# Patient Record
Sex: Male | Born: 1955 | Race: White | Hispanic: No | Marital: Single | Smoking: Current every day smoker
Health system: Southern US, Community
[De-identification: ages and names within clinical notes are randomized; demographics above are authoritative.]

## PROBLEM LIST (undated history)

## (undated) DIAGNOSIS — Z8719 Personal history of other diseases of the digestive system: Secondary | ICD-10-CM

## (undated) DIAGNOSIS — E039 Hypothyroidism, unspecified: Secondary | ICD-10-CM

## (undated) DIAGNOSIS — R519 Headache, unspecified: Secondary | ICD-10-CM

## (undated) DIAGNOSIS — M75100 Unspecified rotator cuff tear or rupture of unspecified shoulder, not specified as traumatic: Secondary | ICD-10-CM

## (undated) DIAGNOSIS — B1920 Unspecified viral hepatitis C without hepatic coma: Secondary | ICD-10-CM

## (undated) DIAGNOSIS — J40 Bronchitis, not specified as acute or chronic: Secondary | ICD-10-CM

## (undated) DIAGNOSIS — K5909 Other constipation: Secondary | ICD-10-CM

## (undated) DIAGNOSIS — N400 Enlarged prostate without lower urinary tract symptoms: Secondary | ICD-10-CM

## (undated) DIAGNOSIS — J45909 Unspecified asthma, uncomplicated: Secondary | ICD-10-CM

## (undated) DIAGNOSIS — J189 Pneumonia, unspecified organism: Secondary | ICD-10-CM

## (undated) DIAGNOSIS — J449 Chronic obstructive pulmonary disease, unspecified: Secondary | ICD-10-CM

## (undated) DIAGNOSIS — H269 Unspecified cataract: Secondary | ICD-10-CM

## (undated) DIAGNOSIS — R0602 Shortness of breath: Secondary | ICD-10-CM

## (undated) DIAGNOSIS — Z973 Presence of spectacles and contact lenses: Secondary | ICD-10-CM

## (undated) DIAGNOSIS — K219 Gastro-esophageal reflux disease without esophagitis: Secondary | ICD-10-CM

## (undated) DIAGNOSIS — B009 Herpesviral infection, unspecified: Secondary | ICD-10-CM

## (undated) DIAGNOSIS — Z8669 Personal history of other diseases of the nervous system and sense organs: Secondary | ICD-10-CM

## (undated) DIAGNOSIS — M1990 Unspecified osteoarthritis, unspecified site: Secondary | ICD-10-CM

## (undated) DIAGNOSIS — F329 Major depressive disorder, single episode, unspecified: Secondary | ICD-10-CM

## (undated) DIAGNOSIS — S199XXA Unspecified injury of neck, initial encounter: Secondary | ICD-10-CM

## (undated) DIAGNOSIS — G253 Myoclonus: Secondary | ICD-10-CM

## (undated) DIAGNOSIS — F319 Bipolar disorder, unspecified: Secondary | ICD-10-CM

## (undated) DIAGNOSIS — M6530 Trigger finger, unspecified finger: Secondary | ICD-10-CM

## (undated) DIAGNOSIS — F419 Anxiety disorder, unspecified: Secondary | ICD-10-CM

## (undated) DIAGNOSIS — R413 Other amnesia: Secondary | ICD-10-CM

## (undated) DIAGNOSIS — M722 Plantar fascial fibromatosis: Secondary | ICD-10-CM

## (undated) DIAGNOSIS — F209 Schizophrenia, unspecified: Secondary | ICD-10-CM

## (undated) DIAGNOSIS — F32A Depression, unspecified: Secondary | ICD-10-CM

## (undated) DIAGNOSIS — R51 Headache: Secondary | ICD-10-CM

## (undated) DIAGNOSIS — R351 Nocturia: Secondary | ICD-10-CM

## (undated) DIAGNOSIS — H04129 Dry eye syndrome of unspecified lacrimal gland: Secondary | ICD-10-CM

## (undated) DIAGNOSIS — G8929 Other chronic pain: Secondary | ICD-10-CM

## (undated) DIAGNOSIS — R350 Frequency of micturition: Secondary | ICD-10-CM

## (undated) HISTORY — DX: Bronchitis, not specified as acute or chronic: J40

## (undated) HISTORY — DX: Unspecified osteoarthritis, unspecified site: M19.90

## (undated) HISTORY — DX: Trigger finger, unspecified finger: M65.30

## (undated) HISTORY — DX: Myoclonus: G25.3

## (undated) HISTORY — DX: Other chronic pain: G89.29

## (undated) HISTORY — DX: Unspecified injury of neck, initial encounter: S19.9XXA

## (undated) HISTORY — DX: Other amnesia: R41.3

## (undated) HISTORY — DX: Herpesviral infection, unspecified: B00.9

## (undated) HISTORY — DX: Anxiety disorder, unspecified: F41.9

## (undated) HISTORY — DX: Personal history of other diseases of the nervous system and sense organs: Z86.69

## (undated) HISTORY — DX: Nocturia: R35.1

## (undated) HISTORY — DX: Shortness of breath: R06.02

## (undated) HISTORY — DX: Benign prostatic hyperplasia without lower urinary tract symptoms: N40.0

## (undated) HISTORY — DX: Hypothyroidism, unspecified: E03.9

## (undated) HISTORY — PX: TRIGGER FINGER RELEASE: SHX641

## (undated) HISTORY — DX: Plantar fascial fibromatosis: M72.2

## (undated) HISTORY — DX: Personal history of other diseases of the digestive system: Z87.19

## (undated) HISTORY — DX: Unspecified viral hepatitis C without hepatic coma: B19.20

## (undated) HISTORY — DX: Depression, unspecified: F32.A

## (undated) HISTORY — DX: Other constipation: K59.09

## (undated) HISTORY — DX: Frequency of micturition: R35.0

## (undated) HISTORY — PX: ROTATOR CUFF REPAIR: SHX139

## (undated) HISTORY — DX: Major depressive disorder, single episode, unspecified: F32.9

---

## 2004-11-23 ENCOUNTER — Emergency Department: Payer: Self-pay | Admitting: Emergency Medicine

## 2005-12-03 ENCOUNTER — Emergency Department: Payer: Self-pay | Admitting: Emergency Medicine

## 2006-04-07 ENCOUNTER — Other Ambulatory Visit: Payer: Self-pay

## 2006-04-07 ENCOUNTER — Emergency Department: Payer: Self-pay | Admitting: Emergency Medicine

## 2008-01-10 ENCOUNTER — Emergency Department: Payer: Self-pay | Admitting: Emergency Medicine

## 2008-03-24 ENCOUNTER — Ambulatory Visit: Payer: Self-pay

## 2008-07-03 ENCOUNTER — Other Ambulatory Visit: Payer: Self-pay

## 2008-07-03 ENCOUNTER — Emergency Department: Payer: Self-pay | Admitting: Emergency Medicine

## 2008-07-18 ENCOUNTER — Ambulatory Visit: Payer: Self-pay | Admitting: Internal Medicine

## 2008-10-20 ENCOUNTER — Emergency Department: Payer: Self-pay | Admitting: Emergency Medicine

## 2008-11-15 ENCOUNTER — Emergency Department: Payer: Self-pay | Admitting: Emergency Medicine

## 2008-12-04 ENCOUNTER — Emergency Department: Payer: Self-pay | Admitting: Emergency Medicine

## 2008-12-13 ENCOUNTER — Emergency Department: Payer: Self-pay | Admitting: Emergency Medicine

## 2008-12-15 ENCOUNTER — Ambulatory Visit: Payer: Self-pay | Admitting: Pain Medicine

## 2008-12-18 ENCOUNTER — Emergency Department: Payer: Self-pay | Admitting: Unknown Physician Specialty

## 2008-12-27 ENCOUNTER — Emergency Department: Payer: Self-pay | Admitting: Unknown Physician Specialty

## 2009-01-12 ENCOUNTER — Emergency Department: Payer: Self-pay | Admitting: Emergency Medicine

## 2009-01-14 ENCOUNTER — Emergency Department: Payer: Self-pay

## 2009-03-12 ENCOUNTER — Emergency Department: Payer: Self-pay | Admitting: Emergency Medicine

## 2009-03-29 ENCOUNTER — Emergency Department: Payer: Self-pay | Admitting: Internal Medicine

## 2009-10-03 HISTORY — PX: COLONOSCOPY: SHX174

## 2009-12-30 ENCOUNTER — Ambulatory Visit: Payer: Self-pay

## 2010-12-23 ENCOUNTER — Emergency Department: Payer: Self-pay | Admitting: Emergency Medicine

## 2011-04-28 ENCOUNTER — Emergency Department: Payer: Self-pay | Admitting: Unknown Physician Specialty

## 2012-04-17 ENCOUNTER — Emergency Department: Payer: Self-pay | Admitting: Emergency Medicine

## 2012-08-14 ENCOUNTER — Ambulatory Visit: Payer: Self-pay | Admitting: Internal Medicine

## 2012-09-15 ENCOUNTER — Emergency Department: Payer: Self-pay | Admitting: Emergency Medicine

## 2012-10-24 ENCOUNTER — Emergency Department: Payer: Self-pay

## 2013-01-25 LAB — COMPREHENSIVE METABOLIC PANEL
Albumin: 3.6 g/dL (ref 3.4–5.0)
Alkaline Phosphatase: 58 U/L (ref 50–136)
Anion Gap: 6 — ABNORMAL LOW (ref 7–16)
BUN: 18 mg/dL (ref 7–18)
Bilirubin,Total: 0.4 mg/dL (ref 0.2–1.0)
Chloride: 106 mmol/L (ref 98–107)
EGFR (Non-African Amer.): >60
SGPT (ALT): 38 U/L (ref 12–78)
Sodium: 138 mmol/L (ref 136–145)

## 2013-01-25 LAB — URINALYSIS, COMPLETE
Blood: NEGATIVE
Glucose,UR: NEGATIVE mg/dL (ref 0–75)
Leukocyte Esterase: NEGATIVE
Ph: 6 (ref 4.5–8.0)
RBC,UR: 3 /HPF (ref 0–5)

## 2013-01-25 LAB — AMYLASE: Amylase: 160 U/L — ABNORMAL HIGH (ref 25–115)

## 2013-01-25 LAB — CBC
HCT: 41.8 % (ref 40.0–52.0)
HGB: 14.1 g/dL (ref 13.0–18.0)
MCH: 32 pg (ref 26.0–34.0)
MCHC: 33.8 g/dL (ref 32.0–36.0)
MCV: 95 fL (ref 80–100)
Platelet: 229 10*3/uL (ref 150–440)
RBC: 4.42 10*6/uL (ref 4.40–5.90)
WBC: 7.5 10*3/uL (ref 3.8–10.6)

## 2013-01-25 LAB — LIPASE, BLOOD: Lipase: 416 U/L — ABNORMAL HIGH (ref 73–393)

## 2013-01-26 ENCOUNTER — Inpatient Hospital Stay: Payer: Self-pay | Admitting: Internal Medicine

## 2013-01-26 LAB — TROPONIN I
Troponin-I: <0.02 ng/mL
Troponin-I: <0.02 ng/mL

## 2013-01-26 LAB — TSH: Thyroid Stimulating Horm: 0.607 u[IU]/mL

## 2013-01-26 LAB — LIPID PANEL
HDL Cholesterol: 56 mg/dL (ref 40–60)
Triglycerides: 40 mg/dL (ref 0–200)
VLDL Cholesterol, Calc: 8 mg/dL (ref 5–40)

## 2013-01-27 LAB — CBC WITH DIFFERENTIAL/PLATELET
Basophil #: 0 10*3/uL (ref 0.0–0.1)
Basophil %: 0.4 %
HCT: 36.8 % — ABNORMAL LOW (ref 40.0–52.0)
HGB: 12.5 g/dL — ABNORMAL LOW (ref 13.0–18.0)
Lymphocyte %: 44.1 %
MCHC: 34.1 g/dL (ref 32.0–36.0)
MCV: 94 fL (ref 80–100)
Monocyte #: 0.4 x10 3/mm (ref 0.2–1.0)
Monocyte %: 7.7 %
Neutrophil %: 46.8 %
Platelet: 194 10*3/uL (ref 150–440)
RBC: 3.92 10*6/uL — ABNORMAL LOW (ref 4.40–5.90)

## 2013-01-27 LAB — BASIC METABOLIC PANEL
Anion Gap: 3 — ABNORMAL LOW (ref 7–16)
Calcium, Total: 8.4 mg/dL — ABNORMAL LOW (ref 8.5–10.1)
Co2: 28 mmol/L (ref 21–32)
Potassium: 4.3 mmol/L (ref 3.5–5.1)
Sodium: 142 mmol/L (ref 136–145)

## 2013-01-27 LAB — LIPASE, BLOOD: Lipase: 378 U/L (ref 73–393)

## 2014-03-26 ENCOUNTER — Ambulatory Visit: Payer: Self-pay | Admitting: Specialist

## 2014-03-26 DIAGNOSIS — I10 Essential (primary) hypertension: Secondary | ICD-10-CM

## 2014-03-26 LAB — BASIC METABOLIC PANEL
ANION GAP: 6 — AB (ref 7–16)
BUN: 16 mg/dL (ref 7–18)
Calcium, Total: 9.3 mg/dL (ref 8.5–10.1)
Chloride: 107 mmol/L (ref 98–107)
Co2: 25 mmol/L (ref 21–32)
Creatinine: 1.1 mg/dL (ref 0.60–1.30)
EGFR (African American): >60
EGFR (Non-African Amer.): >60
Glucose: 93 mg/dL (ref 65–99)
Osmolality: 277 (ref 275–301)
POTASSIUM: 3.7 mmol/L (ref 3.5–5.1)
Sodium: 138 mmol/L (ref 136–145)

## 2014-04-01 ENCOUNTER — Ambulatory Visit: Payer: Self-pay | Admitting: Specialist

## 2014-04-07 DIAGNOSIS — J449 Chronic obstructive pulmonary disease, unspecified: Secondary | ICD-10-CM | POA: Insufficient documentation

## 2014-06-13 ENCOUNTER — Emergency Department: Payer: Self-pay | Admitting: Internal Medicine

## 2014-06-20 ENCOUNTER — Emergency Department: Payer: Self-pay | Admitting: Internal Medicine

## 2014-11-12 ENCOUNTER — Ambulatory Visit: Payer: Self-pay | Admitting: Specialist

## 2014-11-25 ENCOUNTER — Ambulatory Visit: Payer: Self-pay | Admitting: Specialist

## 2014-11-27 ENCOUNTER — Emergency Department: Payer: Self-pay | Admitting: Emergency Medicine

## 2015-01-23 NOTE — Discharge Summary (Signed)
PATIENT NAME:  Hayden Hall, Hayden Hall MR#:  [id number] DATE OF BIRTH:  18-Mar-1956  DATE OF ADMISSION:  01/26/2013 DATE OF DISCHARGE:  01/27/2013  ADMITTING PHYSICIAN: Dr. Waldron Labs.   DISCHARGING PHYSICIAN: Dr. Gladstone Lighter.  PRIMARY CARE PHYSICIAN: Dr. Nicky Pugh.   DISCHARGE DIAGNOSES: 1.  Acute pancreatitis.  2.  Sinus bradycardia.  3.  Hypothyroidism.  4.  Benign prostatic hypertrophy.  5.  Depression.  6.  Early dementia.  7.  Gastroesophageal reflux disease.  8.  Chronic obstructive pulmonary disease.   DISCHARGE HOME MEDICATIONS: 1.  Norco 5/325 mg 1 tablet p.o. q.6 hours p.r.n., for pain.  2.  Prilosec 20 mg p.o. daily.  3.  Loratadine 10 mg p.o. daily.  4.  Invega 3 mg p.o. at bedtime.  5.  Banophen 50 mg p.o. at bedtime.  6.  Cyclobenzaprine 10 mg 3 times a day.  7.  Benadryl 100 mg p.o. at bedtime.  8.  Aricept 10 mg p.o. at bedtime.  9.  Vitamin B12, 500 mcg p.o. daily.  10.  Flomax 0.4 mg p.o. daily.  11.  Levothyroxine 25 mcg p.o. daily.   DISCHARGE DIET: Low-fat diet.   DISCHARGE ACTIVITY: As tolerated.   FOLLOWUP INSTRUCTIONS: PCP followup in 2 weeks.   LABS AND IMAGING STUDIES: WBC 5.8, hemoglobin 12.3, hematocrit 36.8, platelet count 194.   Sodium 142, potassium 4.3, chloride 111, bicarbonate 28, BUN 12, creatinine 0.7, glucose 86, and calcium of 8.4. Lipase 378. Troponins negative. TSH within normal limits at 0.607.   LDL of 76, HDL of 56, triglycerides 40, total cholesterol 140.   CT of the abdomen and pelvis showing no acute or intrapelvic pathology. Urinalysis negative for any infection.   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    Acute pancreatitis: Either gallstone-related or idiopathic pancreatitis, prior episodes, and he already follows with a hepatologist at [facility] for unknown reasons at this time. He was placed n.p.o. A CT of the abdomen did not show any acute intraabdominal pathology. No gallstones noted. He was gradually advanced from a liquid diet to a low-fat diet, and his lipase normalized at the time of discharge. He is currently not symptomatic, and he is being discharged home in a stable condition.   All his other home medications are being continued without further changes. He was advised to follow a low-fat, low-cholesterol diet for now.   DISCHARGE CONDITION: Stable.   DISCHARGE DISPOSITION: Home.   TIME SPENT ON DISCHARGE: Forty minutes.     ____________________________ Gladstone Lighter, MD rk:dm D: 01/27/2013 08:52:00 ET T: 01/27/2013 09:15:34 ET JOB#: [id number]  cc: Jaquelyn Bitter B. Brynda Greathouse, MD Gladstone Lighter, MD, <Dictator>   Gladstone Lighter MD ELECTRONICALLY SIGNED 01/31/2013 16:02

## 2015-01-23 NOTE — H&P (Signed)
PATIENT NAME:  Hayden Hall, Hayden Hall MR#:  [id number] DATE OF BIRTH:  12-06-1955  DATE OF ADMISSION:  01/26/2013  REFERRING PHYSICIAN:  Dr. Marta Antu.  PRIMARY CARE PHYSICIAN:  Dr. Sharin Mons.  CHIEF COMPLAINT:  Abdominal pain, nausea, vomiting.   HISTORY OF PRESENT ILLNESS:  This is a 59 year old male with a significant past medical history of acute pancreatitis x 2 episodes in the 1970s, 1974 and 1976, none since then, history of depression, history of previous alcohol abuse and drug abuse, none for the last 18 years, hypothyroidism, presents with complaints of abdominal pain, nausea, vomiting, the patient reports he has been having abdominal pain for the last two days, denies any fever, chills, diarrhea, constipation, chest pain, shortness of breath or loss of appetite, as well had complaints of nausea for the last 24 hours and vomiting x 2, describes his pain as severe which improved in ED after he received IV morphine and Dilaudid.  Denies any relieving or provoking factors, in ED, the patient was afebrile, was found to have elevated lipase at 416 and elevated amylase at 160, his LFTs were essentially within normal limits, the patient had CT abdomen and pelvis done in ED which did not show any acute abdominal or pelvic pathology with unremarkable gallbladder and no biliary ductal dilatation, the patient denies any chest pain, any shortness of breath, any coffee-ground emesis, diarrhea, bright red blood per rectum, the patient reports he could not keep his meds due to nausea and vomiting, and he is in significant pain requiring IV pain medications, so hospitalist service were requested to admit the patient for pain management and hydration.   PAST MEDICAL HISTORY: 1.  History of alcohol abuse.  No alcohol drinking for the last 18 years, history of drug abuse, none for the last 18 years.  2.  Depression.  3.  History of acute pancreatitis in 1974 and 1976.  4.  Gastroesophageal disease.  5.   Hypertension.  6.  COPD.   PAST SURGICAL HISTORY:  None.   SOCIAL HISTORY:  The patient used to be a smoker and has history of alcohol and substance abuse.  He quit before 18 years smoking and alcohol drinking and drugs.   FAMILY HISTORY:  Significant for hypertension and coronary artery disease.   ALLERGIES:  No known drug allergies.   HOME MEDICATIONS:  The patient cannot recall the whole list of his meds, but he recalls he is on:  1.  Tramadol 1 to 2 tablets every 6 hours.  2.  Synthroid 25 mcg oral daily.  3.  Flomax 0.4 mg oral daily.  4.  Omeprazole 20 mg oral daily.   REVIEW OF SYSTEMS: CONSTITUTIONAL:  The patient denies any fever or chills.  Complains of generalized weakness and fatigue.  Denies weight gain or weight loss.  EYES:  Denies blurry vision, double vision, pain or inflammation.  EARS, NOSE, THROAT:  Denies tinnitus, ear pain, hearing loss, epistaxis.  RESPIRATORY:  Denies cough, wheezing, hemoptysis, dyspnea.  CARDIOVASCULAR:  Denies chest pain, orthopnea, edema, arrhythmia, palpitation.  GASTROINTESTINAL:  Denies hematemesis, coffee-ground emesis, bright red blood per rectum or melena, has complaints of nausea, vomiting, abdominal pain.  GENITOURINARY:  Denies dysuria, hematuria, renal colic.  ENDOCRINE:  Denies polyuria, polydipsia, heat or cold intolerance.  HEMATOLOGY:  Denies anemia, easy bruising or bleeding diathesis.  INTEGUMENTARY:  Denies acne, rash or skin lesions.  MUSCULOSKELETAL:  Complains of lower back pain.  Denies any arthritis or cramps or limited activity.  NEUROLOGIC:  Complains of headache.  Denies CVA, seizures, memory loss, dementia, or ataxia.  PSYCHIATRIC:  Has history of depression, but denies any suicidal thoughts or ideation.  Denies any schizophrenia or insomnia.   PHYSICAL EXAMINATION: VITAL SIGNS:  Temperature 98.1, pulse 64, respiratory rate 20, blood pressure 106/60 and saturating 97% on room air.  GENERAL:  Well-nourished male,  looks comfortable in bed, in no apparent distress.  HEENT:  Head atraumatic, normocephalic.  Pupils equal, reactive to light.  Pink conjunctivae.  Anicteric sclerae.  Moist oral mucosa.  NECK:  Supple.  No thyromegaly.  No JVD.  CHEST:  Good air entry bilaterally.  No wheezing, rales, rhonchi.  CARDIOVASCULAR:  S1, S2 heard.  No rubs, murmurs, gallops.  ABDOMEN:  Mild tenderness in epigastric area to palpation.  No hepatosplenomegaly.  Abdomen has no rebound, no guarding.  Bowel sounds present.  EXTREMITIES:  No edema.  No clubbing.  No cyanosis.  Dorsalis pedis pulse +2.  SKIN:  Normal skin turgor.  Warm and dry.  PSYCHIATRIC:  Appropriate affect.  Awake, alert x 3.  Intact judgment and insight.  NEUROLOGIC:  Cranial nerves grossly intact.  Motor 5 out of 5.   PERTINENT LABORATORY DATA:  Glucose 97, BUN 18, creatinine 0.75, sodium 138, potassium 3.5, chloride 106, CO2 26, ALT 38, AST 30, alkaline phosphatase 58, total bilirubin 0.4.  Urinalysis negative for leukocyte esterase and nitrites.  White blood cells 7.5, hemoglobin 14.1, hematocrit 41.8, platelets 229.  CT abdomen and pelvis showing no acute abdominal or pelvic pathology.   ASSESSMENT AND PLAN: 1.  Acute pancreatitis, the patient has history in the past for acute pancreatitis, but this was related to alcoholic pancreatitis, at this point he quit drinking for 18 years, CT abdomen does not show any gallstones or any biliary duct obstruction or dilatation, we will check lipid panel in the a.m., meanwhile we will continue with aggressive IV hydration with IV of normal saline 125 mL per hour, we will have on as needed pain and nausea medication.  We will have him on clear diet, advance him as tolerated, we will check his lipase level tomorrow a.m.  2.  Hypothyroidism.  Continue with Synthroid, we will check with patient's pharmacy in the morning to confirm the dose.  3.  Benign prostatic hypertrophy.  Continue with Flomax.  4.  Chronic pain  syndrome secondary to chronic lower back pain.  The patient is on pain meds due to his pancreatitis, as well we will resume him back on his tramadol.  5.  Deep vein thrombosis prophylaxis.  SubQ heparin.  6.  CODE STATUS:  FULL CODE.   Total time spent on admission and patient care 55 minutes.    ____________________________ Albertine Patricia, MD dse:ea D: 01/26/2013 01:18:42 ET T: 01/26/2013 03:25:31 ET JOB#: [id number]  cc: Albertine Patricia, MD, <Dictator> Katerin Negrete Graciela Husbands MD ELECTRONICALLY SIGNED 01/27/2013 0:21

## 2015-01-24 NOTE — Op Note (Signed)
PATIENT NAME:  Hayden Hall, Hayden Hall MR#:  [id number] DATE OF BIRTH:  October 14, 1955  DATE OF PROCEDURE:  04/01/2014  PREOPERATIVE DIAGNOSIS:  Right long trigger finger.   POSTOPERATIVE DIAGNOSIS:  Right long trigger finger.   PROCEDURE: Release of right long trigger finger.   SURGEON: Christophe Louis, MD   ANESTHESIA: General.   COMPLICATIONS: None.   TOURNIQUET TIME: 18 minutes.   DESCRIPTION OF PROCEDURE: After adequate induction of general anesthesia, the right upper extremity is thoroughly prepped with alcohol and ChloraPrep and draped in standard sterile fashion. Under loupe magnification, standard transverse trigger finger incision is made at the right long finger and the dissection carried down to the A1 pulley. The A1 pulley is excised. The finger was passively flexed down and there was continued triggering.  The incision was therefore extended distally and more release was performed along with a tenosynovectomy of the flexor tendons. Following this, there was no more passive triggering. The wound was thoroughly irrigated multiple times. Skin edges were closed with 4-0 nylon. Skin edges were infiltrated with 0.5% Marcaine. Soft bulky dressing is applied. The tourniquet is released. The patient is returned to the recovery room in satisfactory condition having tolerated the procedure quite well.   ____________________________ Lucas Mallow, MD ces:dd D: 04/01/2014 12:14:11 ET T: 04/01/2014 19:36:12 ET JOB#: [id number]  cc: Lucas Mallow, MD, <Dictator> Lucas Mallow MD ELECTRONICALLY SIGNED 04/07/2014 10:08

## 2015-02-01 NOTE — Op Note (Signed)
PATIENT NAME:  [name], Hayden Hall MR#:  [id number] DATE OF BIRTH:  [birth year]  DATE OF PROCEDURE:  11/25/2014  PREOPERATIVE DIAGNOSES: 1.  Rotator cuff tear, left shoulder.  2.  Severe degenerative arthritis of acromioclavicular joint, left shoulder.   POSTOPERATIVE DIAGNOSES: 1.  Rotator cuff tear, left shoulder.  2.  Severe degenerative arthritis of acromioclavicular joint, left shoulder.   PROCEDURES:  1.  Repair of large rotator cuff tear, left shoulder, open.  2.  Open excision of distal left clavicle.   SURGEON: Christophe Louis, M.D.   ANESTHESIA: Supraclavicular block and general.   ESTIMATED BLOOD LOSS: 100 mL.   DRAINS: None.   PROCEDURE:  Two grams of Ancef were given intravenously prior to the procedure. Supraclavicular block is performed by anesthesia and then general anesthesia. The patient is placed in the lawn chair position with a bump beneath the left shoulder. The left shoulder and arm were thoroughly prepped with ChloraPrep and alcohol and draped in a standard sterile fashion. The proposed anterosuperior incision at the left shoulder is infiltrated with 0.5% Marcaine with epinephrine. Standard incision is made and the dissection carefully carried down using the Bovie. The dissection is carried superiorly to the acromioclavicular joint and the distal clavicle is resected out with the periosteal elevator. The saw is then used to excise the distal centimeter and 0.5 of the clavicle and the rongeur was used to clean out any separate fragments.  Fascia is closed directly over this excision. The deltoid muscle is then split at the midportion of the anterior acromion. Bursa is excised. Anterior acromioplasty is performed using the rongeur and the TPS rasp. Rotator cuff is mobilized and there is seen to be a large tear in the distal 2/3's of the tendon, which has retracted approximately 3 cm. The tendon tissue is good and is mobilized and then is repaired to remaining an excellent  cuff tissue at the greater tuberosity. This was performed using 2 #2 Tycron sutures in a Kessler formation and then the repair was cleaned up and smoothed off using several 2-0 Vicryl sutures. Range of motion of the shoulder demonstrated excellent stability. The wound is thoroughly irrigated multiple times.  Muscle is closed with 2-0 Vicryl. Skin edges are closed with 2-0 Vicryl and the skin is closed with the skin stapler. A soft bulky dressing is applied along with a sling, and the patient is returned to the recovery room in satisfactory condition having tolerated the procedure quite well.   ___________________________ Lucas Mallow, MD ces:mc D: 11/25/2014 10:15:55 ET T: 11/25/2014 12:34:35 ET JOB#: [id number]  cc: Lucas Mallow, MD, <Dictator> Lucas Mallow MD ELECTRONICALLY SIGNED 12/13/2014 10:06

## 2015-10-06 ENCOUNTER — Encounter: Payer: Self-pay | Admitting: *Deleted

## 2015-10-12 ENCOUNTER — Ambulatory Visit: Payer: Self-pay | Admitting: Urology

## 2015-10-19 ENCOUNTER — Ambulatory Visit (INDEPENDENT_AMBULATORY_CARE_PROVIDER_SITE_OTHER): Payer: Medicaid Other | Admitting: Urology

## 2015-10-19 ENCOUNTER — Other Ambulatory Visit: Payer: Self-pay

## 2015-10-19 ENCOUNTER — Encounter: Payer: Self-pay | Admitting: Urology

## 2015-10-19 VITALS — BP 111/71 | HR 50 | Ht 71.0 in | Wt 171.5 lb

## 2015-10-19 DIAGNOSIS — N401 Enlarged prostate with lower urinary tract symptoms: Secondary | ICD-10-CM

## 2015-10-19 DIAGNOSIS — N138 Other obstructive and reflux uropathy: Secondary | ICD-10-CM

## 2015-10-19 NOTE — Progress Notes (Signed)
10/19/2015 9:37 PM   [place] 1956-06-13 [id number]  Referring provider: No referring provider defined for this encounter.  Chief Complaint  Patient presents with  . Urinary Frequency    1 year follow up  . Benign Prostatic Hypertrophy    HPI: Patient is a 60 year old Caucasian male with a history of BPH with LUTS who presents today for his one year follow up.  BPH WITH LUTS His IPSS score today is 23, which is severe lower urinary tract symptomatology. He is mostly dissatisfied with his quality life due to his urinary symptoms. When asked about his LUTS, he was unable to answer the question appropriately.  He is preoccupied with discussing his relationship with [facility] and how he is going to rely on the Lord to heal him.  I could not redirect the patient.   He has had these symptoms for several years.  He denies any dysuria, hematuria or suprapubic pain.   He has been on alpha-blockers and OAB agents, but he is not taking these medications at this time.  He also denies any recent fevers, chills, nausea or vomiting.  He has a family history of PCa, with his father having prostate cancer.         IPSS      10/19/15 1600       International Prostate Symptom Score   How often have you had the sensation of not emptying your bladder? Almost always     How often have you had to urinate less than every two hours? More than half the time     How often have you found you stopped and started again several times when you urinated? About half the time     How often have you found it difficult to postpone urination? About half the time     How often have you had a weak urinary stream? Less than half the time     How often have you had to strain to start urination? Less than 1 in 5 times     How many times did you typically get up at night to urinate? 5 Times     Total IPSS Score 23     Quality of Life due to urinary symptoms   If you were to spend the rest of your life  with your urinary condition just the way it is now how would you feel about that? Mostly Disatisfied        Score:  1-7 Mild 8-19 Moderate 20-35 Severe     PMH: Past Medical History  Diagnosis Date  . Chronic constipation   . Hypothyroidism   . History of seizure disorder   . Bronchitis   . Neck injury   . Hepatitis C   . Trigger finger   . Myoclonic jerking   . Memory impairment   . SOB (shortness of breath)   . Herpes   . Chronic pain   . History of pancreatitis   . Depression   . BPH (benign prostatic hyperplasia)   . Urinary frequency   . Nocturia     Surgical History: Past Surgical History  Procedure Laterality Date  . Trigger finger release    . Rotator cuff repair Left     Home Medications:    Medication List       This list is accurate as of: 10/19/15 11:59 PM.  Always use your most recent med list.  FIRST-DUKES MOUTHWASH MT  Reported on 10/19/2015     FLOMAX 0.4 MG Caps capsule  Generic drug:  tamsulosin  Take 0.4 mg by mouth. Reported on 10/19/2015     hydrocortisone 2.5 % cream  Reported on 10/19/2015     Ledipasvir-Sofosbuvir 90-400 MG Tabs  Take by mouth. Reported on 10/19/2015     levothyroxine 25 MCG tablet  Commonly known as:  SYNTHROID, LEVOTHROID  Take 25 mcg by mouth daily.     meloxicam 15 MG tablet  Commonly known as:  MOBIC  TAKE 1 TABLET(S) EVERY DAY BY ORAL ROUTE WITH FOOD.     MULTI-VITAMINS Tabs  Take by mouth.     neomycin-polymyxin b-dexamethasone 3.5-10000-0.1 Susp  Commonly known as:  MAXITROL  Reported on 10/19/2015     promethazine 25 MG tablet  Commonly known as:  PHENERGAN  Take 25 mg by mouth. Reported on 10/19/2015     RA OMEPRAZOLE 20 MG Tbec  Generic drug:  Omeprazole  Take 20 mg by mouth. Reported on 10/19/2015     Saw Palmetto 160 MG Caps  Take 100 mg by mouth.     traMADol 50 MG tablet  Commonly known as:  ULTRAM  Take by mouth every 6 (six) hours as needed. for pain      valACYclovir 500 MG tablet  Commonly known as:  VALTREX  Take 500 mg by mouth.        Allergies:  Allergies  Allergen Reactions  . Myrbetriq [Mirabegron]   . Vesicare [Solifenacin]     Dizziness     Family History: Family History  Problem Relation Age of Onset  . Breast cancer Mother   . Heart disease    . Kidney disease Cousin   . Prostate cancer Father   . Kidney disease Mother     Social History:  reports that he has quit smoking. He does not have any smokeless tobacco history on file. He reports that he does not drink alcohol or use illicit drugs.  ROS: UROLOGY Frequent Urination?: No Hard to postpone urination?: Yes Burning/pain with urination?: No Get up at night to urinate?: Yes Leakage of urine?: Yes Urine stream starts and stops?: Yes Trouble starting stream?: Yes Do you have to strain to urinate?: Yes Blood in urine?: No Urinary tract infection?: No Sexually transmitted disease?: Yes Injury to kidneys or bladder?: No Painful intercourse?: No Weak stream?: No Erection problems?: No Penile pain?: No  Gastrointestinal Nausea?: Yes Vomiting?: No Indigestion/heartburn?: No Diarrhea?: No Constipation?: No  Constitutional Fever: No Night sweats?: No Weight loss?: No Fatigue?: No  Skin Skin rash/lesions?: No Itching?: Yes  Eyes Blurred vision?: Yes Double vision?: No  Ears/Nose/Throat Sore throat?: No Sinus problems?: No  Hematologic/Lymphatic Swollen glands?: No Easy bruising?: No  Cardiovascular Leg swelling?: No Chest pain?: No  Respiratory Cough?: No Shortness of breath?: No  Endocrine Excessive thirst?: No  Musculoskeletal Back pain?: Yes Joint pain?: Yes  Neurological Headaches?: Yes Dizziness?: No  Psychologic Depression?: Yes Anxiety?: Yes  Physical Exam: BP 111/71 mmHg  Pulse 50  Ht 5\' 11"  (1.803 m)  Wt 171 lb 8 oz (77.792 kg)  BMI 23.93 kg/m2  Constitutional: Well nourished. Alert and oriented, No  acute distress. HEENT: [place] AT, moist mucus membranes. Trachea midline, no masses. Cardiovascular: No clubbing, cyanosis, or edema. Respiratory: Normal respiratory effort, no increased work of breathing. GI: Abdomen is soft, non tender, non distended, no abdominal masses. Liver and spleen not palpable.  No hernias appreciated.  Stool  sample for occult testing is not indicated.   GU: No CVA tenderness.  No bladder fullness or masses.  Patient with circumcised phallus.  Urethral meatus is patent.  No penile discharge. No penile lesions or rashes. Scrotum without lesions, cysts, rashes and/or edema.  Testicles are located scrotally bilaterally. No masses are appreciated in the testicles. Left and right epididymis are normal. Rectal: Patient with  normal sphincter tone. Anus and perineum without scarring or rashes. No rectal masses are appreciated. Prostate is approximately 45 grams, no nodules are appreciated. Seminal vesicles are normal. Skin: No rashes, bruises or suspicious lesions. Lymph: No cervical or inguinal adenopathy. Neurologic: Grossly intact, no focal deficits, moving all 4 extremities. Psychiatric: Normal mood and affect.  Laboratory Data:  PSA History  0.3 ng/mL on 10/08/2014  Lab Results  Component Value Date   WBC 5.8 01/27/2013   HGB 12.5* 01/27/2013   HCT 36.8* 01/27/2013   MCV 94 01/27/2013   PLT 194 01/27/2013   Lab Results  Component Value Date   CREATININE 1.10 03/26/2014   Lab Results  Component Value Date   TSH 0.607 01/26/2013   Lab Results  Component Value Date   AST 30 01/25/2013   Lab Results  Component Value Date   ALT 38 01/25/2013    Assessment & Plan:    1. BPH (benign prostatic hyperplasia) with LUTS:   Patient's IPSS score is 23/4.  He has been tried on alpha-blockers, OAB medications and finasteride.  He does not take these medications on a consistent basis due to his bought's of delirium.  Today, he is certain that he will be healed by  spiritual means.  I will have him RTC in one year for IPSS score, exam and PSA.    - PSA  Return in about 1 year (around 10/18/2016) for IPSS score and exam.  These notes generated with voice recognition software. I apologize for typographical errors.  Zara Council, [facility] [address] [place], [place] [address] [phone]

## 2015-10-20 ENCOUNTER — Ambulatory Visit: Payer: Self-pay | Admitting: Urology

## 2015-10-20 LAB — PSA: PROSTATE SPECIFIC AG, SERUM: 0.3 ng/mL (ref 0.0–4.0)

## 2015-10-21 DIAGNOSIS — N138 Other obstructive and reflux uropathy: Secondary | ICD-10-CM | POA: Insufficient documentation

## 2015-10-21 DIAGNOSIS — N401 Enlarged prostate with lower urinary tract symptoms: Principal | ICD-10-CM | POA: Insufficient documentation

## 2015-10-22 ENCOUNTER — Telehealth: Payer: Self-pay

## 2015-10-22 NOTE — Telephone Encounter (Signed)
-----   Message from Nori Riis, PA-C sent at 10/20/2015  8:13 AM EST ----- PSA is stable.  We will see him in one year.

## 2015-10-22 NOTE — Telephone Encounter (Signed)
Spoke with pt in reference to PSA results. Pt voiced understanding.  

## 2016-04-27 ENCOUNTER — Ambulatory Visit: Payer: Medicaid Other | Attending: Orthopedic Surgery

## 2016-04-27 ENCOUNTER — Encounter: Payer: Self-pay | Admitting: Physical Therapy

## 2016-04-27 DIAGNOSIS — M25611 Stiffness of right shoulder, not elsewhere classified: Secondary | ICD-10-CM

## 2016-04-27 DIAGNOSIS — M25511 Pain in right shoulder: Secondary | ICD-10-CM | POA: Diagnosis present

## 2016-04-27 DIAGNOSIS — M6281 Muscle weakness (generalized): Secondary | ICD-10-CM | POA: Insufficient documentation

## 2016-04-27 NOTE — Patient Instructions (Signed)
Cane Exercise: Flexion    Lie on back, holding cane at waist. Keeping arms as straight as possible, lift cane overhead as far as comfortable. Use L arm to do most of the work and try to relax the R arm. Do no move through resistance. Lower back down to waist and then repeat.  Repeat _10___ times. Do _2__ sessions per day.   SHOULDER: External Rotation - Supine (Cane)    Hold cane with both hands. Rotate arm away from body. Keep elbow on floor and next to body. Perform 10 times. Do 2 sessions per day. Add towel to keep elbow at side.  Internal Rotation (Isometric)    Place palm of right fist against door frame, with elbow bent. Press fist against door frame. Hold _3-5___ seconds. Repeat _10___ times. Do __2__ sessions per day.   External Rotation (Isometric)    Place back of right fist against door frame, with elbow bent. Press fist against door frame. Hold _3-5___ seconds. Repeat _10___ times. Do __2__ sessions per day.   Flexion (Isometric)    Press right fist against wall. Hold _3-5___ seconds. Repeat _10___ times. Do _2___ sessions per day.    Extension (Isometric)    Place right bent elbow and back of arm against wall. Press elbow against wall. Hold _3-5__ seconds. Repeat __10__ times. Do _2___ sessions per day.

## 2016-04-27 NOTE — Therapy (Signed)
[place] [facility] [address] [place], [address] Phone: [phone]   Fax:  [phone]  Physical Therapy Evaluation  Patient Details  Name: Hayden Hall MRN: [id number] Date of Birth: 1956/07/13 Referring Provider: Tania Ade  Encounter Date: 04/27/2016      PT End of Session - 04/27/16 0924    Visit Number 1   Number of Visits 13   Date for PT Re-Evaluation 06/08/16   PT Start Time 0805   PT Stop Time 0910   PT Time Calculation (min) 65 min   Activity Tolerance Patient limited by pain   Behavior During Therapy [facility] for tasks assessed/performed      Past Medical History:  Diagnosis Date  . BPH (benign prostatic hyperplasia)   . Bronchitis   . Chronic constipation   . Chronic pain   . Depression   . Hepatitis C   . Herpes   . History of pancreatitis   . History of seizure disorder   . Hypothyroidism   . Memory impairment   . Myoclonic jerking   . Neck injury   . Nocturia   . SOB (shortness of breath)   . Trigger finger   . Urinary frequency     Past Surgical History:  Procedure Laterality Date  . ROTATOR CUFF REPAIR Left   . TRIGGER FINGER RELEASE      There were no vitals filed for this visit.       Subjective Assessment - 04/27/16 0808    Subjective S/p R shoulder surgery   Pertinent History Pt underwent R shoulder surgery 03/29/16. Pt unable to recall the details of the surgery. Per MD order surgery was a R shoulder arthroscopy, debride irreparable rct, and biceps tenotomy. Pt reports that surgery was precipitated by a R shoulder injury while he was trying to mow a ditch. He has had post-operative pain but overall reports that it has significantly improved compared to prior to surgery. He is using his pain medications to manage the pain. Pt states that he was instructed to perform supine cane exercises for flexion and external rotation after the surgery but he has not  performed.  He is currently on lifting restrictions but states that the surgeon did not give him much direction regarding activity restriction. He has been out of the sling since 1 week post-op. Pt reports he has a history of L shoulder pain as well and underwent surgery 2-3 years ago.   Limitations Lifting   Patient Stated Goals Decrease pain and improve function.    Currently in Pain? Yes   Pain Score 5   Best: 0/10, Worst: 10/10   Pain Location Shoulder   Pain Orientation Right   Pain Descriptors / Indicators Pins and needles;Cramping;Radiating   Pain Type Surgical pain   Pain Radiating Towards All the way down to the hand. Pt reports pins and needles in the hands as well as anterior shoulder   Pain Onset 1 to 4 weeks ago   Pain Frequency Constant   Aggravating Factors  scrambling eggs, washing dishes, rolling over at night   Pain Relieving Factors tramadol, ice (initially post-operatively), hydrocodone   Multiple Pain Sites Yes   Pain Score 8   Pain Location Shoulder   Pain Orientation Left   Pain Type Chronic pain   Pain Onset More than a month ago   Pain Frequency Constant            [facility] PT Assessment -  04/27/16 0810      Assessment   Medical Diagnosis s/p right shoulder arthroscopy, debride irreparable rct, biceps tenotomy   Referring Provider Tania Ade   Onset Date/Surgical Date 03/29/16   Hand Dominance Right   Next MD Visit 05/01/16   Prior Therapy History of HH PT for L shoulder, no history of R shoulder therapy. Pt reports history of PT for achilles tendon at [facility]     Precautions   Precautions Shoulder   Type of Shoulder Precautions No lifting currently   Shoulder Interventions Shoulder sling/immobilizer  Currently out of sling. Discontinued 1 wk post-op     Restrictions   Weight Bearing Restrictions No     Balance Screen   Has the patient fallen in the past 6 months No     [place] residence   Living  Arrangements Alone   Available Help at Discharge Other (Comment)  no friends and family     Prior Function   Level of Independence Independent     Cognition   Overall Cognitive Status No family/caregiver present to determine baseline cognitive functioning   Memory Impaired   Memory Impairment Decreased short term memory;Other (comment)  Pt reports difficulty with forgetfulness     Observation/Other Assessments   Other Surveys  Other Surveys   Quick DASH  84.1     Sensation   Light Touch Impaired by gross assessment   Additional Comments Pt reports decreased sensation to light touch in LUE C5-T2. Reports tingling in RUE intermittently but has difficulty localizing     Posture/Postural Control   Posture Comments Rounded shoulders. R arm at side in guarded fashion     ROM / Strength   AROM / PROM / Strength AROM;PROM;Strength     AROM   Overall AROM Comments Attempted R shoulder AROM in supine but pain within the first 30 degrees of flexion so deferred at this time. Cervical AROM appears grossly WFL with mild chronic pain at end range in all directions. Pt has extensive history of neck pain   AROM Assessment Site Shoulder   Right/Left Shoulder Right;Left   Right Shoulder Flexion --   Left Shoulder Flexion 146 Degrees  Painful   Left Shoulder ABduction 145 Degrees  Painful   Left Shoulder Internal Rotation 60 Degrees  Painful   Left Shoulder External Rotation 85 Degrees  Painful     PROM   PROM Assessment Site Shoulder   Right/Left Shoulder Right;Left   Right Shoulder Flexion 145 Degrees  No resistance, painful catches reported by pt which release   Right Shoulder ABduction 145 Degrees  Painful   Right Shoulder Internal Rotation 60 Degrees  Painful   Right Shoulder External Rotation 85 Degrees  Painful   Left Shoulder Flexion 155 Degrees  Pain througout motion but no resistance   Left Shoulder ABduction 145 Degrees  Painful   Left Shoulder Internal Rotation 60  Degrees  Painful   Left Shoulder External Rotation 85 Degrees  Painful     Strength   Overall Strength Comments Strength testing deferred for R shoulder and elbow due to surgical precautions   Strength Assessment Site Shoulder;Elbow;Wrist;Forearm   Right/Left Shoulder Right;Left   Left Shoulder Flexion 4/5  Painful   Left Shoulder ABduction 4/5  Painful   Right/Left Elbow Right;Left   Left Elbow Flexion 4+/5   Left Elbow Extension 4+/5   Right/Left Forearm Right;Left   Right Forearm Pronation 4+/5   Right Forearm Supination 4+/5  Left Forearm Pronation 4+/5   Left Forearm Supination 4+/5   Right/Left Wrist Right;Left   Right Wrist Flexion 4+/5   Right Wrist Extension 4+/5   Left Wrist Flexion 4+/5   Left Wrist Extension 4+/5     Palpation   Palpation comment Generalized pain to palpation along R shoulder     Transfers   Comments Independent with all ambulation and transfers     Ambulation/Gait   Gait Comments Deferred         TREATMENT  Pt instructed in HEP and provided handout. Performed with patient supine canes for flexion and ER. Standing R shoulder isometrics for flexion, extension, IR, and ER. Pt able to demonstrate all exercises appropriately.                   PT Education - 04/27/16 [id number]    Education provided Yes   Education Details Plan of care, HEP   Person(s) Educated Patient   Methods Explanation;Demonstration;Handout   Comprehension Verbalized understanding;Returned demonstration;Verbal cues required;Tactile cues required             PT Long Term Goals - 04/27/16 1204      PT LONG TERM GOAL #1   Title Pt will be independent with HEP in order to decrease pain and resume full function at home   Time 6   Period Weeks   Status New     PT LONG TERM GOAL #2   Title Pt will demonstrate full R shoulder AROM/PROM when compared to L shoulder in order to be able to perform all self care and household responsibilities   Time 6    Period Weeks   Status New     PT LONG TERM GOAL #3   Title Pt will demonstrate full R shoulder strength when compared to L shoulder in order to be able to perform all self-care and household responsibilities   Time 6   Period Weeks   Status New     PT LONG TERM GOAL #4   Title Pt will report worst pain no greater than 7/10 in order to demonstrate clinically significant reduction in pain to improve function at home   Baseline 04/27/16: worst: 10/10   Time 6   Period Weeks   Status New               Plan - 04/27/16 0924    Clinical Impression Statement Pt is a pleasant 60 yo male who underwent R shoulder arthroscopy, debride irreparable rct, and biceps tenotomy on 03/29/16 (date per patient report). The surgery was precipitated by a R shoulder injury while he was mowing a ditch. Pt reporting improvement in overall R shoulder pain since the surgery. He has not been performing exercises as instructed by orthopedist however has been respecting lifting restrictions. Examination today reveals R shoulder PROM grossly WFL and symmetrical to L shoulder with the exception of 10 degree less R shoulder flexion. He has intermittent catching throughout flexion and abduction PROM but no resistance and pain quickly resolves. R shoulder strength testing deferred at this time due to surgical precautions but attempted R shoulder AROM flexion in supine is limited due to pain. Pt will benefit from skilled PT services to restore full AROM and strength to R shoulder as well decrease pain so that he can return to full function at home.    Rehab Potential Good   Clinical Impairments Affecting Rehab Potential Positive: motivation, decreased pain s/p surgery; Negative: chronic pain   PT Frequency  2x / week   PT Duration 6 weeks   PT Treatment/Interventions Aquatic Therapy;Cryotherapy;ADLs/Self Care Home Management;Electrical Stimulation;Iontophoresis 4mg /ml Dexamethasone;Moist Heat;Ultrasound;Functional mobility  training;Therapeutic activities;Balance training;Therapeutic exercise;Neuromuscular re-education;Cognitive remediation;Patient/family education;Manual techniques;Passive range of motion;Scar mobilization;Dry needling;Taping   PT Next Visit Plan Progress AAROM with goal for reaching full AROM, progress isometrics. PT followed [facility] protocol on this date for biceps tenotomy   PT Home Exercise Plan AAROM supine canes for flexion and ER, standing R shoulder submaximal isometrics for flexion, extension, IR, and ER   Consulted and Agree with Plan of Care Patient      Patient will benefit from skilled therapeutic intervention in order to improve the following deficits and impairments:  Pain, Decreased strength, Decreased range of motion, Impaired UE functional use  Visit Diagnosis: Pain in right shoulder - Plan: PT plan of care cert/re-cert  Stiffness of right shoulder, not elsewhere classified - Plan: PT plan of care cert/re-cert  Muscle weakness (generalized) - Plan: PT plan of care cert/re-cert     Problem List Patient Active Problem List   Diagnosis Date Noted  . BPH with obstruction/lower urinary tract symptoms 10/21/2015   Phillips Grout PT, DPT   Gurleen Larrivee 04/27/2016, 12:13 PM  [place] [facility] [address], [place], [address] Phone: [phone]   Fax:  [phone]  Name: Hayden Hall MRN: [id number] Date of Birth: 30-Jun-1956

## 2016-05-04 ENCOUNTER — Ambulatory Visit: Payer: Medicaid Other | Attending: Orthopedic Surgery | Admitting: Physical Therapy

## 2016-05-04 DIAGNOSIS — M25511 Pain in right shoulder: Secondary | ICD-10-CM | POA: Insufficient documentation

## 2016-05-04 DIAGNOSIS — M25611 Stiffness of right shoulder, not elsewhere classified: Secondary | ICD-10-CM | POA: Diagnosis not present

## 2016-05-04 NOTE — Therapy (Signed)
[place] [facility] [address], [place], [address] Phone: [phone]   Fax:  [phone]  Physical Therapy Treatment  Patient Details  Name: Hayden Hall MRN: [id number] Date of Birth: 1956-08-13 Referring Provider: Tania Ade  Encounter Date: 05/04/2016      PT End of Session - 05/04/16 0844    Visit Number 2   Number of Visits 13   Date for PT Re-Evaluation 06/08/16   PT Start Time 0830   PT Stop Time 0910   PT Time Calculation (min) 40 min   Activity Tolerance Patient limited by pain   Behavior During Therapy [facility] for tasks assessed/performed      Past Medical History:  Diagnosis Date  . BPH (benign prostatic hyperplasia)   . Bronchitis   . Chronic constipation   . Chronic pain   . Depression   . Hepatitis C   . Herpes   . History of pancreatitis   . History of seizure disorder   . Hypothyroidism   . Memory impairment   . Myoclonic jerking   . Neck injury   . Nocturia   . SOB (shortness of breath)   . Trigger finger   . Urinary frequency     Past Surgical History:  Procedure Laterality Date  . ROTATOR CUFF REPAIR Left   . TRIGGER FINGER RELEASE      There were no vitals filed for this visit.      Subjective Assessment - 05/04/16 0836    Subjective Pt reports incr. pain since previous session.   Currently in Pain? Yes   Pain Score 6    Pain Location Shoulder   Pain Orientation Right             Objective: Reviewed and corrected wand exercises to decr. RUE use.  UE ranger seated forward, side to side, 3x30 of each with manual cuing to avoid UT compensation.  UE ranger in standing, attempted shoulder flexion but discontinued at 5th rep due to incr. Pain.  Arm swings, forward 2x1 min, side to side 2x1 min, rotational 2x1 min. Pt reported these felt "good" and he will do these at home.  RTB scapular retractions, pt required education and cuing for  performance, 3x15  RTB low rows also performed 3x15, pt reported decr. Pain with these, noted triceps activation to an excess but pt will continue to work on this at home. Issued RTB.                    PT Education - 05/04/16 [phone]    Education provided Yes   Education Details progression of HEP   Person(s) Educated Patient             PT Long Term Goals - 04/27/16 1204      PT LONG TERM GOAL #1   Title Pt will be independent with HEP in order to decrease pain and resume full function at home   Time 6   Period Weeks   Status New     PT LONG TERM GOAL #2   Title Pt will demonstrate full R shoulder AROM/PROM when compared to L shoulder in order to be able to perform all self care and household responsibilities   Time 6   Period Weeks   Status New     PT LONG TERM GOAL #3   Title Pt will demonstrate full R shoulder strength when compared to L shoulder in order to  be able to perform all self-care and household responsibilities   Time 6   Period Weeks   Status New     PT LONG TERM GOAL #4   Title Pt will report worst pain no greater than 7/10 in order to demonstrate clinically significant reduction in pain to improve function at home   Baseline 04/27/16: worst: 10/10   Time 6   Period Weeks   Status New               Plan - 05/04/16 0845    Clinical Impression Statement Pt is significantly limited by pain in AROM and strength. is performing his exercises at home however reports pain with this,  modified AAROM with cane to decr. pain.   Rehab Potential Good   Clinical Impairments Affecting Rehab Potential Positive: motivation, decreased pain s/p surgery; Negative: chronic pain   PT Frequency 2x / week   PT Duration 6 weeks   PT Treatment/Interventions Aquatic Therapy;Cryotherapy;ADLs/Self Care Home Management;Electrical Stimulation;Iontophoresis 4mg /ml Dexamethasone;Moist Heat;Ultrasound;Functional mobility training;Therapeutic activities;Balance  training;Therapeutic exercise;Neuromuscular re-education;Cognitive remediation;Patient/family education;Manual techniques;Passive range of motion;Scar mobilization;Dry needling;Taping   PT Next Visit Plan Progress AAROM with goal for reaching full AROM, progress isometrics. PT followed [facility] protocol on this date for biceps tenotomy   PT Home Exercise Plan AAROM supine canes for flexion and ER, standing R shoulder submaximal isometrics for flexion, extension, IR, and ER   Consulted and Agree with Plan of Care Patient      Patient will benefit from skilled therapeutic intervention in order to improve the following deficits and impairments:  Pain, Decreased strength, Decreased range of motion, Impaired UE functional use  Visit Diagnosis: Stiffness of right shoulder, not elsewhere classified  Pain in right shoulder     Problem List Patient Active Problem List   Diagnosis Date Noted  . BPH with obstruction/lower urinary tract symptoms 10/21/2015    Malori Myers PT DPT 05/04/2016, 9:21 AM  [facility] [address] [place], [address] Phone: [phone]   Fax:  [phone]  Name: Hayden Hall MRN: [id number] Date of Birth: Jan 31, 1956

## 2016-05-10 ENCOUNTER — Encounter: Payer: Medicaid Other | Admitting: Physical Therapy

## 2016-05-26 ENCOUNTER — Ambulatory Visit: Payer: Medicaid Other | Admitting: Physical Therapy

## 2016-05-26 DIAGNOSIS — M25611 Stiffness of right shoulder, not elsewhere classified: Secondary | ICD-10-CM

## 2016-05-26 DIAGNOSIS — M25511 Pain in right shoulder: Secondary | ICD-10-CM

## 2016-05-26 NOTE — Therapy (Signed)
[place] [facility] [address], [place], [address] Phone: [phone]   Fax:  [phone]  Physical Therapy Treatment  Patient Details  Name: Hayden Hall MRN: [id number] Date of Birth: 10-Aug-1956 Referring Provider: Tania Ade  Encounter Date: 05/26/2016      PT End of Session - 05/26/16 1009    Visit Number 3   Number of Visits 13   Date for PT Re-Evaluation 06/08/16   PT Start Time 0945   PT Stop Time 1010   PT Time Calculation (min) 25 min   Activity Tolerance Patient limited by pain   Behavior During Therapy Agitated      Past Medical History:  Diagnosis Date  . BPH (benign prostatic hyperplasia)   . Bronchitis   . Chronic constipation   . Chronic pain   . Depression   . Hepatitis C   . Herpes   . History of pancreatitis   . History of seizure disorder   . Hypothyroidism   . Memory impairment   . Myoclonic jerking   . Neck injury   . Nocturia   . SOB (shortness of breath)   . Trigger finger   . Urinary frequency     Past Surgical History:  Procedure Laterality Date  . ROTATOR CUFF REPAIR Left   . TRIGGER FINGER RELEASE      There were no vitals filed for this visit.      Subjective Assessment - 05/26/16 0945    Subjective Pt reports he only wants to do ice today due to significantly incr. pain since previous session. Pt was using a ratchet yesterday working on his car, also used an air hose to "blow out the dust" from his car. By the end of the day pt had significant "bee sting" pain. Pt reports he has been inconsistent with his HEP and feels he does not know what he is allowed to do.   Pertinent History Pt underwent R shoulder surgery 03/29/16. Pt unable to recall the details of the surgery. Per MD order surgery was a R shoulder arthroscopy, debride irreparable rct, and biceps tenotomy. Pt reports that surgery was precipitated by a R shoulder injury while he was trying to  mow a ditch. He has had post-operative pain but overall reports that it has significantly improved compared to prior to surgery. He is using his pain medications to manage the pain. Pt states that he was instructed to perform supine cane exercises for flexion and external rotation after the surgery but he has not performed.  He is currently on lifting restrictions but states that the surgeon did not give him much direction regarding activity restriction. He has been out of the sling since 1 week post-op. Pt reports he has a history of L shoulder pain as well and underwent surgery 2-3 years ago.   Limitations Lifting   Patient Stated Goals Decrease pain and improve function.    Currently in Pain? Yes   Pain Score 7    Pain Location Shoulder   Pain Orientation Right   Pain Onset 1 to 4 weeks ago   Pain Onset More than a month ago              Objective: Educated pt on iontophoresis safety, pt verbalized understanding of this.  Reviewed HEP, corrected pt performance and spent extensive time working with pt on understanding and avoiding excessive activation.  Applied ice (no charge) for 4 min to relax pt  following this.           [facility] Adult PT Treatment/Exercise - 05/26/16 0001      Modalities   Modalities Iontophoresis     Iontophoresis   Type of Iontophoresis Dexamethasone   Location R biceps region   Dose 40 mA/min   Time 10 min setup and education for patch                PT Education - 05/26/16 0952    Education provided Yes   Education Details education on avoiding irritating factors.   Person(s) Educated Patient   Methods Explanation;Demonstration   Comprehension Verbalized understanding             PT Long Term Goals - 04/27/16 1204      PT LONG TERM GOAL #1   Title Pt will be independent with HEP in order to decrease pain and resume full function at home   Time 6   Period Weeks   Status New     PT LONG TERM GOAL #2   Title Pt will  demonstrate full R shoulder AROM/PROM when compared to L shoulder in order to be able to perform all self care and household responsibilities   Time 6   Period Weeks   Status New     PT LONG TERM GOAL #3   Title Pt will demonstrate full R shoulder strength when compared to L shoulder in order to be able to perform all self-care and household responsibilities   Time 6   Period Weeks   Status New     PT LONG TERM GOAL #4   Title Pt will report worst pain no greater than 7/10 in order to demonstrate clinically significant reduction in pain to improve function at home   Baseline 04/27/16: worst: 10/10   Time 6   Period Weeks   Status New               Plan - 05/26/16 1046    Clinical Impression Statement unable to tolerate significant therapy today so focused on pain relieving strategies and education on avoiding irritation. Attempted stretching but unable to do so due to agitation/pain. Currently pt demonstrates no improvement or decrement in ROM or strength compared with previous session.   Rehab Potential Good   Clinical Impairments Affecting Rehab Potential Positive: motivation, decreased pain s/p surgery; Negative: chronic pain   PT Frequency 2x / week   PT Duration 6 weeks   PT Treatment/Interventions Aquatic Therapy;Cryotherapy;ADLs/Self Care Home Management;Electrical Stimulation;Iontophoresis 4mg /ml Dexamethasone;Moist Heat;Ultrasound;Functional mobility training;Therapeutic activities;Balance training;Therapeutic exercise;Neuromuscular re-education;Cognitive remediation;Patient/family education;Manual techniques;Passive range of motion;Scar mobilization;Dry needling;Taping   PT Next Visit Plan Progress AAROM with goal for reaching full AROM, progress isometrics. PT followed [facility] protocol on this date for biceps tenotomy   PT Home Exercise Plan AAROM supine canes for flexion and ER, standing R shoulder submaximal isometrics for flexion, extension, IR, and ER    Consulted and Agree with Plan of Care Patient      Patient will benefit from skilled therapeutic intervention in order to improve the following deficits and impairments:  Pain, Decreased strength, Decreased range of motion, Impaired UE functional use  Visit Diagnosis: Stiffness of right shoulder, not elsewhere classified  Pain in right shoulder     Problem List Patient Active Problem List   Diagnosis Date Noted  . BPH with obstruction/lower urinary tract symptoms 10/21/2015    Yue Flanigan PT DPT 05/26/2016, 11:02 AM  [facility] [address] [place], [address] Phone: [phone]   Fax:  [phone]  Name: Marsha Veness MRN: [id number] Date of Birth: 11/25/1955

## 2016-06-08 ENCOUNTER — Ambulatory Visit: Payer: Medicaid Other | Attending: Physician Assistant | Admitting: Physical Therapy

## 2016-06-08 DIAGNOSIS — M25511 Pain in right shoulder: Secondary | ICD-10-CM

## 2016-06-09 NOTE — Therapy (Signed)
[facility] [address], [place], [address] Phone: [phone]   Fax:  [phone]  Physical Therapy Treatment/Discharge  Patient Details  Name: Hayden Hall MRN: [id number] Date of Birth: [birth year] Referring Provider: Tania Ade  Encounter Date: 06/08/2016      PT End of Session - 06/08/16 0644    Visit Number 4   Number of Visits 13   Date for PT Re-Evaluation 06/08/16   PT Start Time 1115   PT Stop Time 1130   PT Time Calculation (min) 15 min   Activity Tolerance Patient limited by pain   Behavior During Therapy Agitated      Past Medical History:  Diagnosis Date  . BPH (benign prostatic hyperplasia)   . Bronchitis   . Chronic constipation   . Chronic pain   . Depression   . Hepatitis C   . Herpes   . History of pancreatitis   . History of seizure disorder   . Hypothyroidism   . Memory impairment   . Myoclonic jerking   . Neck injury   . Nocturia   . SOB (shortness of breath)   . Trigger finger   . Urinary frequency     Past Surgical History:  Procedure Laterality Date  . ROTATOR CUFF REPAIR Left   . TRIGGER FINGER RELEASE      There were no vitals filed for this visit.      Subjective Assessment - 06/08/16 1122    Subjective Pt reports he is still not doing well with his shoulder. High degree of pain with all use of shoulder, now feels his pain has moved into opposite shoulder.   Pertinent History Pt underwent R shoulder surgery 03/29/16. Pt unable to recall the details of the surgery. Per MD order surgery was a R shoulder arthroscopy, debride irreparable rct, and biceps tenotomy. Pt reports that surgery was precipitated by a R shoulder injury while he was trying to mow a ditch. He has had post-operative pain but overall reports that it has significantly improved compared to prior to surgery. He is using his pain medications to manage the pain. Pt states that he was  instructed to perform supine cane exercises for flexion and external rotation after the surgery but he has not performed.  He is currently on lifting restrictions but states that the surgeon did not give him much direction regarding activity restriction. He has been out of the sling since 1 week post-op. Pt reports he has a history of L shoulder pain as well and underwent surgery 2-3 years ago.   Limitations Lifting   Patient Stated Goals Decrease pain and improve function.    Currently in Pain? Yes   Pain Score 8    Pain Location Shoulder   Pain Orientation Right   Pain Onset 1 to 4 weeks ago   Pain Onset More than a month ago              Objective: Pt again reported he was too painful/unable to participate in exercise or stretching activities within session. PT strongly encouraged pt to change mind but he preferred to focus on what he can do to manage pain/symptoms at home.  Focus of session on education on the importance of consistency over intensity, use of ice and heat as pain relieving modalities, avoiding excessive use of UE (pt admits he routinely does too much with shoulder which flares up his pain), and addressing posture. Corrected FHP  and educated and instructed pt on how to do this at home using hands for self check to avoid [facility].  Pt also instructed in how to attend pro bono clinic, as well as low level exercises to be performed in the [facility] pool as pt has access to this at this time. Exercises are amb in pool with arm swing, gentle stationary arm movement supported by water.  Pt verbalized understanding of all exercises/instruction.                   PT Education - 06/08/16 1124    Education provided Yes   Education Details discharge instructions   Person(s) Educated Patient   Methods Explanation   Comprehension Verbalized understanding             PT Long Term Goals - 06/08/16 0647      PT LONG TERM GOAL #1   Title Pt will be independent  with HEP in order to decrease pain and resume full function at home   Time 6   Period Weeks   Status Not Met     PT LONG TERM GOAL #2   Title Pt will demonstrate full R shoulder AROM/PROM when compared to L shoulder in order to be able to perform all self care and household responsibilities   Time 6   Period Weeks   Status Not Met     PT LONG TERM GOAL #3   Title Pt will demonstrate full R shoulder strength when compared to L shoulder in order to be able to perform all self-care and household responsibilities   Time 6   Period Weeks   Status Not Met     PT LONG TERM GOAL #4   Title Pt will report worst pain no greater than 7/10 in order to demonstrate clinically significant reduction in pain to improve function at home   Baseline 04/27/16: worst: 10/10   Time 6   Period Weeks   Status Not Met               Plan - 06/08/16 0644    Clinical Impression Statement Pt continues to demonstrate poor motor control, muscle weakness, high degree of pain, and very poor safety awareness with all tasks. He has been inconsistent with HEP, and reports moderate to severe daily pain in B shoulders. PT strongly encouraged pt to attempt to start regular physical therapy at local pro-bono clinic as he has utilized all PT available through insurance and would benefit from extended period of PT to attempt to address pain and muscle weakness.    Rehab Potential Good   Clinical Impairments Affecting Rehab Potential Positive: motivation, decreased pain s/p surgery; Negative: chronic pain   PT Frequency 2x / week   PT Duration 6 weeks   PT Treatment/Interventions Aquatic Therapy;Cryotherapy;ADLs/Self Care Home Management;Electrical Stimulation;Iontophoresis 67m/ml Dexamethasone;Moist Heat;Ultrasound;Functional mobility training;Therapeutic activities;Balance training;Therapeutic exercise;Neuromuscular re-education;Cognitive remediation;Patient/family education;Manual techniques;Passive range of  motion;Scar mobilization;Dry needling;Taping   PT Next Visit Plan Progress AAROM with goal for reaching full AROM, progress isometrics. PT followed [facility] protocol on this date for biceps tenotomy   PT Home Exercise Plan AAROM supine canes for flexion and ER, standing R shoulder submaximal isometrics for flexion, extension, IR, and ER   Consulted and Agree with Plan of Care Patient      Patient will benefit from skilled therapeutic intervention in order to improve the following deficits and impairments:  Pain, Decreased strength, Decreased range of motion, Impaired UE functional use  Visit Diagnosis: Pain in right shoulder     Problem List Patient Active Problem List   Diagnosis Date Noted  . BPH with obstruction/lower urinary tract symptoms 10/21/2015    Kenleigh Toback PT DPT 06/09/2016, 6:49 AM  [facility] [address] [place], [address] Phone: [phone]   Fax:  [phone]  Name: Hayden Hall MRN: [id number] Date of Birth: 27-Jan-1956

## 2016-06-19 ENCOUNTER — Encounter: Payer: Self-pay | Admitting: Emergency Medicine

## 2016-06-19 ENCOUNTER — Emergency Department
Admission: EM | Admit: 2016-06-19 | Discharge: 2016-06-19 | Disposition: A | Discharge status: Home / Self Care | Payer: Medicaid Other | Attending: Emergency Medicine | Admitting: Emergency Medicine

## 2016-06-19 DIAGNOSIS — S61219A Laceration without foreign body of unspecified finger without damage to nail, initial encounter: Secondary | ICD-10-CM

## 2016-06-19 DIAGNOSIS — S61012A Laceration without foreign body of left thumb without damage to nail, initial encounter: Secondary | ICD-10-CM | POA: Insufficient documentation

## 2016-06-19 DIAGNOSIS — F329 Major depressive disorder, single episode, unspecified: Secondary | ICD-10-CM | POA: Diagnosis not present

## 2016-06-19 DIAGNOSIS — B1920 Unspecified viral hepatitis C without hepatic coma: Secondary | ICD-10-CM | POA: Diagnosis not present

## 2016-06-19 DIAGNOSIS — Y9389 Activity, other specified: Secondary | ICD-10-CM | POA: Diagnosis not present

## 2016-06-19 DIAGNOSIS — Z87891 Personal history of nicotine dependence: Secondary | ICD-10-CM | POA: Diagnosis not present

## 2016-06-19 DIAGNOSIS — Y999 Unspecified external cause status: Secondary | ICD-10-CM | POA: Diagnosis not present

## 2016-06-19 DIAGNOSIS — W268XXA Contact with other sharp object(s), not elsewhere classified, initial encounter: Secondary | ICD-10-CM | POA: Insufficient documentation

## 2016-06-19 DIAGNOSIS — Y929 Unspecified place or not applicable: Secondary | ICD-10-CM | POA: Diagnosis not present

## 2016-06-19 DIAGNOSIS — E039 Hypothyroidism, unspecified: Secondary | ICD-10-CM | POA: Insufficient documentation

## 2016-06-19 MED ORDER — BACITRACIN ZINC 500 UNIT/GM EX OINT
TOPICAL_OINTMENT | CUTANEOUS | Status: AC
  Filled 2016-06-19: qty 0.9

## 2016-06-19 MED ORDER — OXYCODONE-ACETAMINOPHEN 5-325 MG PO TABS: 1.0000 | ORAL_TABLET | ORAL | 0 refills | Status: DC | PRN

## 2016-06-19 MED ORDER — SULFAMETHOXAZOLE-TRIMETHOPRIM 800-160 MG PO TABS: 1.0000 | ORAL_TABLET | Freq: Two times a day (BID) | ORAL | 0 refills | Status: DC

## 2016-06-19 MED ORDER — LIDOCAINE HCL (PF) 1 % IJ SOLN
INTRAMUSCULAR | Status: AC
  Administered 2016-06-19: 5 mL
  Filled 2016-06-19: qty 5

## 2016-06-19 MED ORDER — BACITRACIN ZINC 500 UNIT/GM EX OINT
TOPICAL_OINTMENT | Freq: Two times a day (BID) | CUTANEOUS | Status: DC
  Administered 2016-06-19: 1 via TOPICAL

## 2016-06-19 NOTE — ED Notes (Signed)
See triage note, circulation, motor function and sensation intact. No CP, SOB, n/v/d or LOC.

## 2016-06-19 NOTE — ED Provider Notes (Signed)
[facility] Emergency Department Provider Note   ____________________________________________   None    (approximate)  I have reviewed the triage vital signs and the nursing notes.   HISTORY  Chief Complaint Laceration    HPI Hayden Hall is a 60 y.o. male patient complain laceration to his left thumb secondary to a hand saw cut. Patient he was cutting a tree limb when the saw jumped and cut across the top of his thumb. Patient state he believes his last tetanus shot was a year ago. Patient denies any loss of function loss of sensation to the left thumb. Patient rates his pain 7/10. Patient described a pain as "sharp". Except for pressure dressing no other palliative measures with this complaint.  Past Medical History:  Diagnosis Date  . BPH (benign prostatic hyperplasia)   . Bronchitis   . Chronic constipation   . Chronic pain   . Depression   . Hepatitis C   . Herpes   . History of pancreatitis   . History of seizure disorder   . Hypothyroidism   . Memory impairment   . Myoclonic jerking   . Neck injury   . Nocturia   . SOB (shortness of breath)   . Trigger finger   . Urinary frequency     Patient Active Problem List   Diagnosis Date Noted  . BPH with obstruction/lower urinary tract symptoms 10/21/2015    Past Surgical History:  Procedure Laterality Date  . ROTATOR CUFF REPAIR Left   . TRIGGER FINGER RELEASE      Prior to Admission medications   Medication Sig Start Date End Date Taking? Authorizing Provider  Diphenhyd-Hydrocort-Nystatin (FIRST-DUKES MOUTHWASH MT) Reported on 10/19/2015 09/28/15   Historical Provider, MD  HYDROcodone-acetaminophen (NORCO/VICODIN) 5-325 MG tablet Take 1 tablet by mouth every 6 (six) hours as needed for moderate pain.    Historical Provider, MD  hydrocortisone 2.5 % cream Reported on 10/19/2015 08/04/15   Historical Provider, MD  Ledipasvir-Sofosbuvir 90-400 MG TABS Take by mouth. Reported on  10/19/2015    Historical Provider, MD  levothyroxine (SYNTHROID, LEVOTHROID) 25 MCG tablet Take 25 mcg by mouth daily. 09/16/15   Historical Provider, MD  meloxicam (MOBIC) 15 MG tablet TAKE 1 TABLET(S) EVERY DAY BY ORAL ROUTE WITH FOOD. 07/30/15   Historical Provider, MD  Multiple Vitamin (MULTI-VITAMINS) TABS Take by mouth.    Historical Provider, MD  neomycin-polymyxin b-dexamethasone (MAXITROL) 3.5-10000-0.1 SUSP Reported on 10/19/2015 09/29/15   Historical Provider, MD  Omeprazole (RA OMEPRAZOLE) 20 MG TBEC Take 20 mg by mouth. Reported on 10/19/2015 12/24/08   Historical Provider, MD  oxyCODONE-acetaminophen (ROXICET) 5-325 MG tablet Take 1 tablet by mouth every 4 (four) hours as needed for severe pain. 06/19/16   Sable Feil, PA-C  promethazine (PHENERGAN) 25 MG tablet Take 25 mg by mouth. Reported on 10/19/2015    Historical Provider, MD  Saw Palmetto 160 MG CAPS Take 100 mg by mouth.    Historical Provider, MD  tamsulosin (FLOMAX) 0.4 MG CAPS capsule Take 0.4 mg by mouth. Reported on 10/19/2015 12/24/08   Historical Provider, MD  traMADol (ULTRAM) 50 MG tablet Take by mouth every 6 (six) hours as needed. for pain 08/20/15   Historical Provider, MD  valACYclovir (VALTREX) 500 MG tablet Take 500 mg by mouth. 01/17/11   Historical Provider, MD    Allergies Vesicare [solifenacin]  Family History  Problem Relation Age of Onset  . Breast cancer Mother   . Kidney disease Mother   .  Heart disease    . Kidney disease Cousin   . Prostate cancer Father     Social History Social History  Substance Use Topics  . Smoking status: Former Research scientist (life sciences)  . Smokeless tobacco: Never Used     Comment: quit 14 years  . Alcohol use No    Review of Systems Constitutional: No fever/chills Eyes: No visual changes. ENT: No sore throat. Cardiovascular: Denies chest pain. Respiratory: Denies shortness of breath. Gastrointestinal: No abdominal pain.  No nausea, no vomiting.  No diarrhea.  No  constipation. Genitourinary: Negative for dysuria. Musculoskeletal: Negative for back pain. Skin: Negative for rash. Finger laceration Neurological: Negative for headaches, focal weakness or numbness. Seizure disorder Psychiatric: Depression Endocrine:Hypothyroidism Hematological/Lymphatic:Hepatitis C Allergic/Immunilogical: See medication list   ____________________________________________   PHYSICAL EXAM:  VITAL SIGNS: ED Triage Vitals  Enc Vitals Group     BP 06/19/16 1714 (!) 136/95     Pulse Rate 06/19/16 1714 64     Resp 06/19/16 1714 18     Temp 06/19/16 1714 98.6 F (37 C)     Temp src --      SpO2 06/19/16 1714 98 %     Weight 06/19/16 1715 157 lb (71.2 kg)     Height 06/19/16 1715 5\' 11"  (1.803 m)     Head Circumference --      Peak Flow --      Pain Score 06/19/16 1715 7     Pain Loc --      Pain Edu? --      Excl. in [place]? --     Constitutional: Alert and oriented. Well appearing and in no acute distress. Eyes: Conjunctivae are normal. PERRL. EOMI. Head: Atraumatic. Nose: No congestion/rhinnorhea. Mouth/Throat: Mucous membranes are moist.  Oropharynx non-erythematous. Neck: No stridor.  No cervical spine tenderness to palpation. Hematological/Lymphatic/Immunilogical: No cervical lymphadenopathy. Cardiovascular: Normal rate, regular rhythm. Grossly normal heart sounds.  Good peripheral circulation. Respiratory: Normal respiratory effort.  No retractions. Lungs CTAB. Gastrointestinal: Soft and nontender. No distention. No abdominal bruits. No CVA tenderness. Musculoskeletal: No lower extremity tenderness nor edema.  No joint effusions. Neurologic:  Normal speech and language. No gross focal neurologic deficits are appreciated. No gait instability. Skin:  1.5 cm laceration dose aspect of left thumb. No rash noted. Psychiatric: Mood and affect are normal. Speech and behavior are normal.  ____________________________________________   LABS (all labs  ordered are listed, but only abnormal results are displayed)  Labs Reviewed - No data to display ____________________________________________  EKG   ____________________________________________  RADIOLOGY   ____________________________________________   PROCEDURES  Procedure(s) performed: LACERATION REPAIR Performed by: Sable Feil Authorized by: Sable Feil Consent: Verbal consent obtained. Risks and benefits: risks, benefits and alternatives were discussed Consent given by: patient Patient identity confirmed: provided demographic data Prepped and Draped in normal sterile fashion Wound explored  Laceration Location: Dorsal left thumb  Laceration Length: 1.5cm  No Foreign Bodies seen or palpated  Anesthesia: Digital block   Local anesthetic: lidocaine 1% without epinephrine  Anesthetic total: 5 ML  Irrigation method: syringe Amount of cleaning: standard  Skin closure: 4-0 nylon   Number of sutures: 5   Technique: Interrupted Patient tolerance: Patient tolerated the procedure well with no immediate complications.   Procedures  Critical Care performed: No  ____________________________________________   INITIAL IMPRESSION / ASSESSMENT AND PLAN / ED COURSE  Pertinent labs & imaging results that were available during my care of the patient were reviewed by me and considered in  my medical decision making (see chart for details).  Left thumb laceration. Patient given discharge care instructions. Patient get a prescription for Percocets. Patient advised to have sutures removed by this department or family doctor in 10 days.  Clinical Course     ____________________________________________   FINAL CLINICAL IMPRESSION(S) / ED DIAGNOSES  Final diagnoses:  Laceration of finger, initial encounter      NEW MEDICATIONS STARTED DURING THIS VISIT:  New Prescriptions   OXYCODONE-ACETAMINOPHEN (ROXICET) 5-325 MG TABLET    Take 1 tablet by mouth  every 4 (four) hours as needed for severe pain.     Note:  This document was prepared using Dragon voice recognition software and may include unintentional dictation errors.    Sable Feil, PA-C 06/19/16 1745    Delman Kitten, MD 06/19/16 [phone]

## 2016-06-19 NOTE — ED Triage Notes (Signed)
Pt states he was using a handsaw cutting a green twig when the saw "jumped from him" and slice the top of his left thumb.  Last tetanus shot is unknown. Pt thinks it has been in the past year, but had it at [facility].

## 2016-06-20 ENCOUNTER — Encounter: Payer: Medicaid Other | Admitting: Physical Therapy

## 2016-10-13 ENCOUNTER — Other Ambulatory Visit: Payer: Medicaid Other

## 2016-10-13 DIAGNOSIS — N138 Other obstructive and reflux uropathy: Secondary | ICD-10-CM

## 2016-10-13 DIAGNOSIS — N401 Enlarged prostate with lower urinary tract symptoms: Principal | ICD-10-CM

## 2016-10-14 LAB — PSA: Prostate Specific Ag, Serum: 0.3 ng/mL (ref 0.0–4.0)

## 2016-10-20 ENCOUNTER — Ambulatory Visit: Payer: Medicaid Other | Admitting: Urology

## 2016-10-28 ENCOUNTER — Emergency Department
Admission: EM | Admit: 2016-10-28 | Discharge: 2016-10-28 | Disposition: A | Discharge status: Home / Self Care | Payer: Medicaid Other | Attending: Emergency Medicine | Admitting: Emergency Medicine

## 2016-10-28 ENCOUNTER — Encounter: Payer: Self-pay | Admitting: Emergency Medicine

## 2016-10-28 DIAGNOSIS — E039 Hypothyroidism, unspecified: Secondary | ICD-10-CM | POA: Insufficient documentation

## 2016-10-28 DIAGNOSIS — Y999 Unspecified external cause status: Secondary | ICD-10-CM | POA: Insufficient documentation

## 2016-10-28 DIAGNOSIS — Z87891 Personal history of nicotine dependence: Secondary | ICD-10-CM | POA: Insufficient documentation

## 2016-10-28 DIAGNOSIS — T1502XA Foreign body in cornea, left eye, initial encounter: Secondary | ICD-10-CM | POA: Insufficient documentation

## 2016-10-28 DIAGNOSIS — S0502XA Injury of conjunctiva and corneal abrasion without foreign body, left eye, initial encounter: Secondary | ICD-10-CM

## 2016-10-28 DIAGNOSIS — T1592XA Foreign body on external eye, part unspecified, left eye, initial encounter: Secondary | ICD-10-CM

## 2016-10-28 DIAGNOSIS — S058X2A Other injuries of left eye and orbit, initial encounter: Secondary | ICD-10-CM | POA: Diagnosis not present

## 2016-10-28 DIAGNOSIS — Y9389 Activity, other specified: Secondary | ICD-10-CM | POA: Diagnosis not present

## 2016-10-28 DIAGNOSIS — Y929 Unspecified place or not applicable: Secondary | ICD-10-CM | POA: Diagnosis not present

## 2016-10-28 DIAGNOSIS — Z79899 Other long term (current) drug therapy: Secondary | ICD-10-CM | POA: Insufficient documentation

## 2016-10-28 DIAGNOSIS — X18XXXA Contact with other hot metals, initial encounter: Secondary | ICD-10-CM | POA: Insufficient documentation

## 2016-10-28 DIAGNOSIS — S0592XA Unspecified injury of left eye and orbit, initial encounter: Secondary | ICD-10-CM | POA: Diagnosis present

## 2016-10-28 MED ORDER — CIPROFLOXACIN HCL 0.3 % OP SOLN
OPHTHALMIC | Status: AC
  Filled 2016-10-28: qty 2.5

## 2016-10-28 MED ORDER — CIPROFLOXACIN HCL 0.3 % OP SOLN: 2.0000 [drp] | OPHTHALMIC | Status: DC

## 2016-10-28 MED ORDER — CIPROFLOXACIN HCL 0.3 % OP SOLN: 2.0000 [drp] | OPHTHALMIC | 0 refills | Status: DC

## 2016-10-28 MED ORDER — HYDROCODONE-ACETAMINOPHEN 5-325 MG PO TABS: 1.0000 | ORAL_TABLET | ORAL | 0 refills | Status: DC | PRN

## 2016-10-28 MED ORDER — CIPROFLOXACIN HCL 0.3 % OP SOLN: 1.0000 [drp] | OPHTHALMIC | 0 refills | Status: AC

## 2016-10-28 MED ORDER — FLUORESCEIN SODIUM 0.6 MG OP STRP
1.0000 | ORAL_STRIP | Freq: Once | OPHTHALMIC | Status: AC
  Administered 2016-10-28: 1 via OPHTHALMIC
  Filled 2016-10-28: qty 1

## 2016-10-28 MED ORDER — CIPROFLOXACIN HCL 0.3 % OP SOLN
2.0000 [drp] | Freq: Once | OPHTHALMIC | Status: AC
  Administered 2016-10-28: 2 [drp] via OPHTHALMIC

## 2016-10-28 NOTE — ED Provider Notes (Signed)
[facility] Provider Note   CSN: [id number] Arrival date & time: 10/28/16  1916     History   Chief Complaint Chief Complaint  Patient presents with  . Eye Injury    HPI Hayden Hall is a 61 y.o. male presents to the emergency department for evaluation of foreign body sensation to the left eye. Patient states 2 hours ago he developed some sharp pain to the left eye present with blinking with slight blurred vision. Patient's pain is 6 out of 10. Patient has been grinding metal today, cutting bolts. He was wearing eye protection. He did not develop any pain until after this activity. Patient first noticed a foreign body sensation in his left eye and went to the [facility] to flush his eye out. This did not provide him with any relief. He came straight to the emergency department.  HPI  Past Medical History:  Diagnosis Date  . BPH (benign prostatic hyperplasia)   . Bronchitis   . Chronic constipation   . Chronic pain   . Depression   . Hepatitis C   . Herpes   . History of pancreatitis   . History of seizure disorder   . Hypothyroidism   . Memory impairment   . Myoclonic jerking   . Neck injury   . Nocturia   . SOB (shortness of breath)   . Trigger finger   . Urinary frequency     Patient Active Problem List   Diagnosis Date Noted  . BPH with obstruction/lower urinary tract symptoms 10/21/2015    Past Surgical History:  Procedure Laterality Date  . ROTATOR CUFF REPAIR Left   . TRIGGER FINGER RELEASE         Home Medications    Prior to Admission medications   Medication Sig Start Date End Date Taking? Authorizing Provider  ciprofloxacin (CILOXAN) 0.3 % ophthalmic solution Place 1 drop into both eyes every 2 (two) hours. Administer 1 drop, every 2 hours, while awake, for 2 days. Then 1 drop, every 4 hours, while awake, for the next 5 days. 10/28/16 11/02/16  Duanne Guess, PA-C  Diphenhyd-Hydrocort-Nystatin (FIRST-DUKES MOUTHWASH MT) Reported  on 10/19/2015 09/28/15   Historical Provider, MD  HYDROcodone-acetaminophen (NORCO) 5-325 MG tablet Take 1 tablet by mouth every 4 (four) hours as needed for moderate pain. 10/28/16   Duanne Guess, PA-C  hydrocortisone 2.5 % cream Reported on 10/19/2015 08/04/15   Historical Provider, MD  Ledipasvir-Sofosbuvir 90-400 MG TABS Take by mouth. Reported on 10/19/2015    Historical Provider, MD  levothyroxine (SYNTHROID, LEVOTHROID) 25 MCG tablet Take 25 mcg by mouth daily. 09/16/15   Historical Provider, MD  meloxicam (MOBIC) 15 MG tablet TAKE 1 TABLET(S) EVERY DAY BY ORAL ROUTE WITH FOOD. 07/30/15   Historical Provider, MD  Multiple Vitamin (MULTI-VITAMINS) TABS Take by mouth.    Historical Provider, MD  neomycin-polymyxin b-dexamethasone (MAXITROL) 3.5-10000-0.1 SUSP Reported on 10/19/2015 09/29/15   Historical Provider, MD  Omeprazole (RA OMEPRAZOLE) 20 MG TBEC Take 20 mg by mouth. Reported on 10/19/2015 12/24/08   Historical Provider, MD  oxyCODONE-acetaminophen (ROXICET) 5-325 MG tablet Take 1 tablet by mouth every 4 (four) hours as needed for severe pain. 06/19/16   Sable Feil, PA-C  promethazine (PHENERGAN) 25 MG tablet Take 25 mg by mouth. Reported on 10/19/2015    Historical Provider, MD  Saw Palmetto 160 MG CAPS Take 100 mg by mouth.    Historical Provider, MD  sulfamethoxazole-trimethoprim (BACTRIM DS,SEPTRA DS) 800-160 MG tablet Take  1 tablet by mouth 2 (two) times daily. 06/19/16   Sable Feil, PA-C  tamsulosin (FLOMAX) 0.4 MG CAPS capsule Take 0.4 mg by mouth. Reported on 10/19/2015 12/24/08   Historical Provider, MD  traMADol (ULTRAM) 50 MG tablet Take by mouth every 6 (six) hours as needed. for pain 08/20/15   Historical Provider, MD  valACYclovir (VALTREX) 500 MG tablet Take 500 mg by mouth. 01/17/11   Historical Provider, MD    Family History Family History  Problem Relation Age of Onset  . Breast cancer Mother   . Kidney disease Mother   . Heart disease    . Kidney disease Cousin     . Prostate cancer Father     Social History Social History  Substance Use Topics  . Smoking status: Former Research scientist (life sciences)  . Smokeless tobacco: Never Used     Comment: quit 14 years  . Alcohol use No     Allergies   Vesicare [solifenacin]   Review of Systems Review of Systems  Constitutional: Negative.  Negative for activity change, appetite change, chills and fever.  HENT: Negative for congestion, ear pain, mouth sores, rhinorrhea, sinus pressure, sore throat and trouble swallowing.   Eyes: Positive for pain. Negative for photophobia, discharge, redness, itching and visual disturbance.  Respiratory: Negative for cough, chest tightness and shortness of breath.   Cardiovascular: Negative for chest pain and leg swelling.  Gastrointestinal: Negative for abdominal distention, abdominal pain, diarrhea, nausea and vomiting.  Genitourinary: Negative for difficulty urinating and dysuria.  Musculoskeletal: Negative for arthralgias, back pain and gait problem.  Skin: Negative for color change and rash.  Neurological: Negative for dizziness and headaches.  Hematological: Negative for adenopathy.  Psychiatric/Behavioral: Negative for agitation and behavioral problems.     Physical Exam Updated Vital Signs BP 132/89 (BP Location: Right Arm)   Pulse 79   Temp 97.7 F (36.5 C) (Oral)   Resp 18   Ht 5\' 11"  (1.803 m)   Wt 74.8 kg   SpO2 98%   BMI 23.01 kg/m   Physical Exam  Constitutional: He appears well-developed and well-nourished.  HENT:  Head: Normocephalic and atraumatic.  Nose: Nose normal.  Eyes: Conjunctivae and EOM are normal. Pupils are equal, round, and reactive to light. Right eye exhibits no discharge. Left eye exhibits no discharge. Foreign body (lids everted bilaterally, small foreign body removed with cotton swab.) present in the left eye. Right conjunctiva is not injected. Right conjunctiva has no hemorrhage. Left conjunctiva is not injected. Left conjunctiva has no  hemorrhage. Right eye exhibits normal extraocular motion. Left eye exhibits normal extraocular motion.  Slit lamp exam:      The left eye shows corneal abrasion and foreign body (foreign body removed under the left upper eyelid as well as along the).    Neck: Normal range of motion.  Cardiovascular: Normal rate.   Pulmonary/Chest: Effort normal. No respiratory distress.  Skin: Skin is warm.     ED Treatments / Results  Labs (all labs ordered are listed, but only abnormal results are displayed) Labs Reviewed - No data to display  EKG  EKG Interpretation None       Radiology No results found.  Procedures Procedures (including critical care time)  Medications Ordered in ED Medications  fluorescein ophthalmic strip 1 strip (1 strip Left Eye Given 10/28/16 2027)  ciprofloxacin (CILOXAN) 0.3 % ophthalmic solution 2 drop (2 drops Left Eye Given 10/28/16 2056)     Initial Impression / Assessment and Plan /  ED Course  I have reviewed the triage vital signs and the nursing notes.  Pertinent labs & imaging results that were available during my care of the patient were reviewed by me and considered in my medical decision making (see chart for details).     61 year old male with foreign body left eye 2, 1 in the upper eyelid and one along the cornea with mild corneal abrasion. Patient's given antibiotic eyedrops. Pain prescription as needed. He will follow-up with Dr. Thomasene Ripple, patient's ophthalmologist. Patient also given follow-up information for Dr. George Ina who was on call patient's educated on signs and symptoms return to emergency chart for.  Final Clinical Impressions(s) / ED Diagnoses   Final diagnoses:  Foreign body of left eye, initial encounter  Abrasion of left cornea, initial encounter    New Prescriptions New Prescriptions   CIPROFLOXACIN (CILOXAN) 0.3 % OPHTHALMIC SOLUTION    Place 1 drop into both eyes every 2 (two) hours. Administer 1 drop, every 2 hours,  while awake, for 2 days. Then 1 drop, every 4 hours, while awake, for the next 5 days.   HYDROCODONE-ACETAMINOPHEN (NORCO) 5-325 MG TABLET    Take 1 tablet by mouth every 4 (four) hours as needed for moderate pain.     Duanne Guess, PA-C 10/28/16 2111    Earleen Newport, MD 10/28/16 2139

## 2016-10-28 NOTE — Discharge Instructions (Signed)
Please take eyedrops as prescribed. Follow-up with ophthalmologist in 2-3 days for recheck. Return to the ER for any worsening symptoms or urgent changes in her health.

## 2016-10-28 NOTE — ED Triage Notes (Signed)
Pt ambulatory to flex, report was grinding metal and got some debris in left eye, states vision normal but feels like FB may be in it.  No redness or swelling noted.  Pt NAD at this time

## 2016-11-07 NOTE — Progress Notes (Signed)
11/08/2016 9:40 AM   Hayden Hall 01-21-1956 [id number]  Referring provider: Marden Noble, MD [address] [place], [place] [address]  Chief Complaint  Patient presents with  . Benign Prostatic Hypertrophy    1 year follow up     HPI: Patient is a 61 year old Caucasian male with a history of BPH with LUTS who presents today for his one year follow up.  BPH WITH LUTS His IPSS score today is 28, which is severe lower urinary tract symptomatology. He is mixed with his quality life due to his urinary symptoms.  His PVR was 0 mL.   His previous I PSS score was 23/4.  His major complaints are frequency, urgency, nocturia, intermittency, hesitancy, straining to urinate and a weak urinary stream.  He has a history of genital herpes.  He has had these symptoms for several years.  He denies any dysuria, hematuria or suprapubic pain.   He has been on alpha-blockers and OAB agents, but he is not taking these medications at this time as they caused to side effects.  He also denies any recent fevers, chills, nausea or vomiting.  He has a family history of PCa, with his father having prostate cancer.     States he drinks one gallon of H2O daily; one pot of coffee daily; rare soda intake; drinks OJ and apple juice     IPSS    Row Name 11/08/16 0900         International Prostate Symptom Score   How often have you had the sensation of not emptying your bladder? Almost always     How often have you had to urinate less than every two hours? Less than half the time     How often have you found you stopped and started again several times when you urinated? More than half the time     How often have you found it difficult to postpone urination? Almost always     How often have you had a weak urinary stream? Almost always     How often have you had to strain to start urination? About half the time     How many times did you typically get up at night to urinate? 4 Times     Total IPSS  Score 28       Quality of Life due to urinary symptoms   If you were to spend the rest of your life with your urinary condition just the way it is now how would you feel about that? Mixed        Score:  1-7 Mild 8-19 Moderate 20-35 Severe     PMH: Past Medical History:  Diagnosis Date  . BPH (benign prostatic hyperplasia)   . Bronchitis   . Chronic constipation   . Chronic pain   . Depression   . Hepatitis C   . Herpes   . History of pancreatitis   . History of seizure disorder   . Hypothyroidism   . Memory impairment   . Myoclonic jerking   . Neck injury   . Nocturia   . SOB (shortness of breath)   . Trigger finger   . Urinary frequency     Surgical History: Past Surgical History:  Procedure Laterality Date  . ROTATOR CUFF REPAIR Left   . ROTATOR CUFF REPAIR Right   . TRIGGER FINGER RELEASE      Home Medications:  Allergies as of 11/08/2016  Reactions   Vesicare [solifenacin]    Dizziness       Medication List       Accurate as of 11/08/16  9:40 AM. Always use your most recent med list.          DOCOSAHEXAENOIC ACID PO Take by mouth.   FIRST-DUKES MOUTHWASH MT Reported on 10/19/2015   FLOMAX 0.4 MG Caps capsule Generic drug:  tamsulosin Take 0.4 mg by mouth. Reported on 10/19/2015   HYDROcodone-acetaminophen 5-325 MG tablet Commonly known as:  NORCO Take 1 tablet by mouth every 4 (four) hours as needed for moderate pain.   hydrocortisone 2.5 % cream Reported on 10/19/2015   Ledipasvir-Sofosbuvir 90-400 MG Tabs Take by mouth. Reported on 10/19/2015   levothyroxine 25 MCG tablet Commonly known as:  SYNTHROID, LEVOTHROID Take 25 mcg by mouth daily.   magic mouthwash Soln Take 1 teaspoon  swish and swallow 4 times per day   meloxicam 15 MG tablet Commonly known as:  MOBIC TAKE 1 TABLET(S) EVERY DAY BY ORAL ROUTE WITH FOOD.   MULTI-VITAMINS Tabs Take by mouth.   neomycin-polymyxin b-dexamethasone 3.5-10000-0.1 Susp Commonly known  as:  MAXITROL Reported on 10/19/2015   oxyCODONE-acetaminophen 5-325 MG tablet Commonly known as:  ROXICET Take 1 tablet by mouth every 4 (four) hours as needed for severe pain.   promethazine 25 MG tablet Commonly known as:  PHENERGAN Take 25 mg by mouth. Reported on 10/19/2015   RA OMEPRAZOLE 20 MG Tbec Generic drug:  Omeprazole Take 20 mg by mouth. Reported on 10/19/2015   Saw Palmetto 160 MG Caps Take 100 mg by mouth.   sulfamethoxazole-trimethoprim 800-160 MG tablet Commonly known as:  BACTRIM DS,SEPTRA DS Take 1 tablet by mouth 2 (two) times daily.   traMADol 50 MG tablet Commonly known as:  ULTRAM Take by mouth every 6 (six) hours as needed. for pain   valACYclovir 500 MG tablet Commonly known as:  VALTREX Take 500 mg by mouth.   VITAMIN B 12 PO Take by mouth.   VITAMIN C PO Take by mouth.       Allergies:  Allergies  Allergen Reactions  . Vesicare [Solifenacin]     Dizziness     Family History: Family History  Problem Relation Age of Onset  . Breast cancer Mother   . Kidney disease Mother   . Bladder Cancer Mother   . Heart disease    . Kidney disease Cousin   . Prostate cancer Father   . Kidney cancer Neg Hx     Social History:  reports that he has quit smoking. He has never used smokeless tobacco. He reports that he does not drink alcohol or use drugs.  ROS: UROLOGY Frequent Urination?: Yes Hard to postpone urination?: Yes Burning/pain with urination?: No Get up at night to urinate?: Yes Leakage of urine?: No Urine stream starts and stops?: Yes Trouble starting stream?: Yes Do you have to strain to urinate?: Yes Blood in urine?: No Urinary tract infection?: No Sexually transmitted disease?: Yes Injury to kidneys or bladder?: No Painful intercourse?: No Weak stream?: Yes Erection problems?: No Penile pain?: No  Gastrointestinal Nausea?: No Vomiting?: No Indigestion/heartburn?: No Diarrhea?: No Constipation?:  No  Constitutional Fever: No Night sweats?: No Weight loss?: No Fatigue?: No  Skin Skin rash/lesions?: No Itching?: No  Eyes Blurred vision?: No Double vision?: No  Ears/Nose/Throat Sore throat?: No Sinus problems?: No  Hematologic/Lymphatic Swollen glands?: No Easy bruising?: No  Cardiovascular Leg swelling?: Yes Chest pain?: No  Respiratory Cough?: No Shortness of breath?: No  Endocrine Excessive thirst?: No  Musculoskeletal Back pain?: No Joint pain?: Yes  Neurological Headaches?: No Dizziness?: No  Psychologic Depression?: No Anxiety?: No  Physical Exam: BP 115/65   Pulse 76   Ht 5\' 11"  (1.803 m)   Wt 170 lb 4.8 oz (77.2 kg)   BMI 23.75 kg/m   Constitutional: Well nourished. Alert and oriented, No acute distress. HEENT: [place] AT, moist mucus membranes. Trachea midline, no masses. Cardiovascular: No clubbing, cyanosis, or edema. Respiratory: Normal respiratory effort, no increased work of breathing. GI: Abdomen is soft, non tender, non distended, no abdominal masses. Liver and spleen not palpable.  No hernias appreciated.  Stool sample for occult testing is not indicated.   GU: No CVA tenderness.  No bladder fullness or masses.  Patient with circumcised phallus.  Urethral meatus is patent.  No penile discharge. No penile lesions or rashes. Scrotum without lesions, cysts, rashes and/or edema.  Testicles are located scrotally bilaterally. No masses are appreciated in the testicles. Left and right epididymis are normal. Rectal: Patient with  normal sphincter tone. Anus and perineum without scarring or rashes. No rectal masses are appreciated. Prostate is approximately 45 grams, no nodules are appreciated. Seminal vesicles are normal. Skin: No rashes, bruises or suspicious lesions. Lymph: No cervical or inguinal adenopathy. Neurologic: Grossly intact, no focal deficits, moving all 4 extremities. Psychiatric: Normal mood and affect.  Laboratory  Data:  PSA History  0.3 ng/mL on 10/08/2014  0.3 ng/mL on 10/13/2016  Lab Results  Component Value Date   WBC 5.8 01/27/2013   HGB 12.5 (L) 01/27/2013   HCT 36.8 (L) 01/27/2013   MCV 94 01/27/2013   PLT 194 01/27/2013   Lab Results  Component Value Date   CREATININE 1.10 03/26/2014   Lab Results  Component Value Date   TSH 0.607 01/26/2013   Lab Results  Component Value Date   AST 30 01/25/2013   Lab Results  Component Value Date   ALT 38 01/25/2013    Assessment & Plan:    1.BPH with LUTS  - IPSS score is 28/3, it is stable  - Continue conservative management, avoiding bladder irritants and timed voiding's  - Cannot tolerate medication and/or medication failure; dicussed PTNS and/or PT as possible non medical treatment options   - RTC in 12 months for IPSS, PSA, PVR and exam   2. Frequency  - could not tolerate the medication  - encouraged decreasing his coffee intake by half  - patient is interested in PT and PTNS at this time  - schedule PTNS  3. Pelvic floor dysfunction  - see above    Return for PTNS; Ramona notified and will call patient.  These notes generated with voice recognition software. I apologize for typographical errors.  Zara Council, [facility] [address] [place], [place] [address] [phone]

## 2016-11-08 ENCOUNTER — Telehealth: Payer: Self-pay | Admitting: *Deleted

## 2016-11-08 ENCOUNTER — Encounter: Payer: Self-pay | Admitting: Urology

## 2016-11-08 ENCOUNTER — Ambulatory Visit (INDEPENDENT_AMBULATORY_CARE_PROVIDER_SITE_OTHER): Payer: Medicaid Other | Admitting: Urology

## 2016-11-08 VITALS — BP 115/65 | HR 76 | Ht 71.0 in | Wt 170.3 lb

## 2016-11-08 DIAGNOSIS — N400 Enlarged prostate without lower urinary tract symptoms: Secondary | ICD-10-CM | POA: Diagnosis not present

## 2016-11-08 DIAGNOSIS — N401 Enlarged prostate with lower urinary tract symptoms: Secondary | ICD-10-CM

## 2016-11-08 DIAGNOSIS — R350 Frequency of micturition: Secondary | ICD-10-CM

## 2016-11-08 DIAGNOSIS — M6289 Other specified disorders of muscle: Secondary | ICD-10-CM | POA: Diagnosis not present

## 2016-11-08 DIAGNOSIS — N138 Other obstructive and reflux uropathy: Secondary | ICD-10-CM

## 2016-11-08 LAB — BLADDER SCAN AMB NON-IMAGING: SCAN RESULT: 0

## 2016-11-08 NOTE — Telephone Encounter (Signed)
Spoke to patient about the PTNS that [facility] wants him to get. Patient has medicaid and that insurance does not cover any cost of PTNS. Patient asked how much and I let him know its like 545.00 dollars each visit and the program is 12 visits patient states no he can not afford any of that. I let him know that I would let [facility] know and someone would contact him back and let him know. Per [facility] physical therapy possibly medicaid will only pay for one visit though. Spoke with patient again and let him know about the physical therapy and he states that if they can teach him everything he needs to know so he can do it himself he would be willing to go for the one visit. I let patient know that I would tell [facility] and someone would give him a call back. Patient ok with the plan.

## 2016-11-09 ENCOUNTER — Ambulatory Visit: Payer: Medicaid Other | Attending: Urology | Admitting: Physical Therapy

## 2016-11-09 ENCOUNTER — Encounter: Payer: Self-pay | Admitting: Physical Therapy

## 2016-11-09 DIAGNOSIS — M25512 Pain in left shoulder: Secondary | ICD-10-CM | POA: Diagnosis present

## 2016-11-09 DIAGNOSIS — R2689 Other abnormalities of gait and mobility: Secondary | ICD-10-CM | POA: Diagnosis not present

## 2016-11-09 DIAGNOSIS — M25511 Pain in right shoulder: Secondary | ICD-10-CM | POA: Diagnosis present

## 2016-11-09 NOTE — Patient Instructions (Addendum)
     _______  Gradually decrease coffee from 1 pot to 1/2 pot this week,  Next week, decrease to 1/4 pot   To decrease night time toileting:   Drink your last glass of water  2 hours before bed, but you can still sip before bed.   Stop coffee after noon in order to not stay up at night    __________    Proper body mechanics with getting out of a chair to decrease strain  on back &pelvic floor   Avoid holding your breath when Getting out of the chair:  Scoot to front part of chair  Heels behind feet, feet are hip width apart, nose over toes  Inhale like you are smelling roses Exhale to stand    _________  Follow up with [facility] for [facility] pain  (handout provided)    _________ [employer] classes   At [facility]: Tai Chi, Yoga, Back Care classes. Healthy Lunches   [address] [phone]:   Your Every Day Guide from the [employer] on Aging at [employer] provided

## 2016-11-09 NOTE — Therapy (Signed)
[place] MAIN [facility] SERVICES [address] [place], [place], [address] Phone: [phone]   Fax:  [phone]  Physical Therapy Evaluation / Discharge Summary  Patient Details  Name: Hayden Hall MRN: [id number] Date of Birth: March 28, 1956 Referring Provider: Zara Council  Encounter Date: 11/09/2016      PT End of Session - 11/09/16 1000    Visit Number 1   Number of Visits 1   Date for PT Re-Evaluation 11/10/16   Authorization Type Medicaid limitation to one visit    PT Start Time 0904   PT Stop Time 1000   PT Time Calculation (min) 56 min   Activity Tolerance Patient tolerated treatment well   Behavior During Therapy [facility] for tasks assessed/performed      Past Medical History:  Diagnosis Date  . Anxiety   . Arthritis    RA   . BPH (benign prostatic hyperplasia)   . Bronchitis   . Chronic constipation   . Chronic pain   . Depression   . Hepatitis C   . Herpes   . History of pancreatitis   . History of seizure disorder   . Hypothyroidism   . Memory impairment   . Myoclonic jerking   . Neck injury   . Nocturia   . SOB (shortness of breath)   . Trigger finger   . Urinary frequency     Past Surgical History:  Procedure Laterality Date  . ROTATOR CUFF REPAIR Left   . ROTATOR CUFF REPAIR Right   . TRIGGER FINGER RELEASE      There were no vitals filed for this visit.       Subjective Assessment - 11/09/16 0916    Subjective 1) Pt reports urinary frequency and goes to the toilet 3-4 x  every 2 hours. This Sx has occurred for 14-15 years. Daily fluid intake: 1 gal of water, 1 pot of coffee. Pt reports he is able to control his bladder most of the time. Pt states the urinary frequency "bugs" him when he is traveling.  Nocturia: 6-7 x/ night. Pt drinks coffee all day, and water in the evening.    2) neck/shoulder pain:  Pt had a RTC surgery on R shoulder  03/28/16  and L shoulder 1/ 2015 or 2016 but did not f/u  with PT due to limited finances .  Pt has a difficulty lifting a gallon of milk, turning a key, and screwing jar top. Pt has radiating pain (numbness and tingling)  to hand on R. Pt was referred to a the [facility] in the past but he was told he needed a MD script and he did not f/u.     Pertinent History Pt had a back injury 40 years ago. Pt did not have surgery. Pt has decreased lifting.  Has RA. Pt tries to walk 1 mile for exercise even with pain. He finds walking does have help his back.              [facility] PT Assessment - 11/09/16 1003      Assessment   Medical Diagnosis urinary frequency   Referring Provider Zara Council     Precautions   Precautions None     Restrictions   Weight Bearing Restrictions No     Balance Screen   Has the patient fallen in the past 6 months No     Prior Function   Level of Independence Independent     Coordination  Gross Motor Movements are Fluid and Coordinated --  breathhlding with sit to stand     Posture/Postural Control   Posture Comments postural sway upon standing after sit to stand     ROM / Strength   AROM / PROM / Strength AROM     AROM   Overall AROM Comments R thumb behind back at L4 level with report of pain     Strength   Overall Strength --  required Larm assistance to lift R arm 180 deg.                    [facility] PT Treatment/Exercise - 11/09/16 1003      Therapeutic Activites    Therapeutic Activities --  education,referred to [facility] for shoulder pain                PT Education - 11/09/16 0931    Education provided Yes   Education Details POC,a antomy/physiology, goals, HEP, bladder irritant education   Person(s) Educated Patient   Methods Explanation;Demonstration;Tactile cues;Verbal cues;Handout   Comprehension Verbalized understanding;Returned demonstration          PT Short Term Goals - 11/09/16 1044      PT SHORT TERM GOAL #1   Title Pt will  voice understanding on decreasing bladder irritants in order to  decrease urinary frequency   Time 1   Period Days   Status Revised     PT SHORT TERM GOAL #2   Title Pt will be provided information about community resources to maintain health and wellness    Time 1   Period Days   Status Achieved     PT SHORT TERM GOAL #3   Title A referral will be faxed to [facility] clinic by PT to help faciliate pt being seen there for shoulder pain.    Time 1   Period Days   Status Achieved                  Plan - 11/09/16 1025    Clinical Impression Statement Pt is a 61 yo male who compaints of frequent urination and B radicular shoulder pain. These two deficits impact his ability to travel and also to unscrew jars, turn key, and overhead movements. Pt's clinical presentations include poor postural stability, dyscoordination of deep core mm, limited education on bladder irritants, and limited functional shoulder ROM 2/2 pain. Pt's finances are limited and thus, pt has been referred to [facility] pro bono clinic to f/u with shoulder rehab. Pt was recommended to decrease coffee intake to decrease urinary frequency and to maintain physical activity with movements before the point of pain. Pt was also provided resources at the [facility] (community classes) and a booklet on general exercises from [employer] of Aging at [employer].  Pt is being d/c at this time due to limited finances and Medicaid coverage for 1 visit.      Rehab Potential Fair   Clinical Impairments Affecting Rehab Potential financial limitation    PT Frequency One time visit   PT Treatment/Interventions ADLs/Self Care Home Management;Neuromuscular re-education;Therapeutic exercise;Patient/family education   Consulted and Agree with Plan of Care Patient      Patient will benefit from skilled therapeutic intervention in order to improve the following deficits and impairments:  Abnormal gait, Decreased coordination, Decreased  endurance, Decreased activity tolerance, Postural dysfunction, Improper body mechanics, Pain, Decreased mobility, Decreased strength  Visit Diagnosis: Other abnormalities of gait and mobility  Right shoulder pain, unspecified chronicity  Left shoulder pain, unspecified chronicity     Problem List Patient Active Problem List   Diagnosis Date Noted  . BPH with obstruction/lower urinary tract symptoms 10/21/2015    Jerl Mina ,PT, DPT, E-RYT  11/09/2016, 10:49 AM  [place] MAIN [facility] SERVICES [address] [place], [place], [address] Phone: [phone]   Fax:  [phone]  Name: Hayden Hall MRN: [id number] Date of Birth: August 18, 1956

## 2016-11-29 ENCOUNTER — Encounter: Payer: Self-pay | Admitting: Medical Oncology

## 2016-11-29 ENCOUNTER — Emergency Department
Admission: EM | Admit: 2016-11-29 | Discharge: 2016-11-29 | Disposition: A | Discharge status: Home / Self Care | Payer: Medicaid Other | Attending: Student in an Organized Health Care Education/Training Program | Admitting: Student in an Organized Health Care Education/Training Program

## 2016-11-29 ENCOUNTER — Emergency Department: Payer: Medicaid Other

## 2016-11-29 DIAGNOSIS — Z87891 Personal history of nicotine dependence: Secondary | ICD-10-CM | POA: Diagnosis not present

## 2016-11-29 DIAGNOSIS — E039 Hypothyroidism, unspecified: Secondary | ICD-10-CM | POA: Diagnosis not present

## 2016-11-29 DIAGNOSIS — Y929 Unspecified place or not applicable: Secondary | ICD-10-CM | POA: Insufficient documentation

## 2016-11-29 DIAGNOSIS — Z23 Encounter for immunization: Secondary | ICD-10-CM | POA: Insufficient documentation

## 2016-11-29 DIAGNOSIS — Y9389 Activity, other specified: Secondary | ICD-10-CM | POA: Diagnosis not present

## 2016-11-29 DIAGNOSIS — Z79899 Other long term (current) drug therapy: Secondary | ICD-10-CM | POA: Insufficient documentation

## 2016-11-29 DIAGNOSIS — S61432A Puncture wound without foreign body of left hand, initial encounter: Secondary | ICD-10-CM | POA: Diagnosis present

## 2016-11-29 DIAGNOSIS — Y999 Unspecified external cause status: Secondary | ICD-10-CM | POA: Diagnosis not present

## 2016-11-29 DIAGNOSIS — W228XXA Striking against or struck by other objects, initial encounter: Secondary | ICD-10-CM | POA: Diagnosis not present

## 2016-11-29 MED ORDER — TETANUS-DIPHTH-ACELL PERTUSSIS 5-2.5-18.5 LF-MCG/0.5 IM SUSP
0.5000 mL | Freq: Once | INTRAMUSCULAR | Status: AC
  Administered 2016-11-29: 0.5 mL via INTRAMUSCULAR
  Filled 2016-11-29: qty 0.5

## 2016-11-29 MED ORDER — HYDROCODONE-ACETAMINOPHEN 5-325 MG PO TABS: 1.0000 | ORAL_TABLET | Freq: Four times a day (QID) | ORAL | 0 refills | Status: DC | PRN

## 2016-11-29 MED ORDER — CEPHALEXIN 500 MG PO CAPS: 500.0000 mg | ORAL_CAPSULE | Freq: Four times a day (QID) | ORAL | 0 refills | Status: AC

## 2016-11-29 MED ORDER — LIDOCAINE HCL (PF) 1 % IJ SOLN
INTRAMUSCULAR | Status: AC
  Filled 2016-11-29: qty 5

## 2016-11-29 MED ORDER — KETOROLAC TROMETHAMINE 60 MG/2ML IM SOLN
30.0000 mg | Freq: Once | INTRAMUSCULAR | Status: AC
  Administered 2016-11-29: 30 mg via INTRAMUSCULAR
  Filled 2016-11-29: qty 2

## 2016-11-29 MED ORDER — LIDOCAINE HCL (PF) 1 % IJ SOLN: 5.0000 mL | Freq: Once | INTRAMUSCULAR | Status: DC

## 2016-11-29 MED ORDER — CEPHALEXIN 500 MG PO CAPS
500.0000 mg | ORAL_CAPSULE | Freq: Once | ORAL | Status: AC
  Administered 2016-11-29: 500 mg via ORAL
  Filled 2016-11-29: qty 1

## 2016-11-29 NOTE — ED Provider Notes (Signed)
[facility] Provider Note   CSN: [id number] Arrival date & time: 11/29/16  1853     History   Chief Complaint Chief Complaint  Patient presents with  . Puncture Wound    HPI Hayden Hall is a 61 y.o. male presents today for evaluation of left hand puncture wound. Patient suffered a puncture wound to the left hand along the first webspace patient states he was using a screwdriver when it slipped and went into his left hand, first webspace. This pain is been moderate. Bleeding is been controlled. His tetanus status is unknown. He denies any numbness or tingling.  HPI  Past Medical History:  Diagnosis Date  . Anxiety   . Arthritis    RA   . BPH (benign prostatic hyperplasia)   . Bronchitis   . Chronic constipation   . Chronic pain   . Depression   . Hepatitis C   . Herpes   . History of pancreatitis   . History of seizure disorder   . Hypothyroidism   . Memory impairment   . Myoclonic jerking   . Neck injury   . Nocturia   . SOB (shortness of breath)   . Trigger finger   . Urinary frequency     Patient Active Problem List   Diagnosis Date Noted  . BPH with obstruction/lower urinary tract symptoms 10/21/2015    Past Surgical History:  Procedure Laterality Date  . ROTATOR CUFF REPAIR Left   . ROTATOR CUFF REPAIR Right   . TRIGGER FINGER RELEASE         Home Medications    Prior to Admission medications   Medication Sig Start Date End Date Taking? Authorizing Provider  Ascorbic Acid (VITAMIN C PO) Take by mouth.    Historical Provider, MD  cephALEXin (KEFLEX) 500 MG capsule Take 1 capsule (500 mg total) by mouth 4 (four) times daily. 11/29/16 12/06/16  Duanne Guess, PA-C  Cyanocobalamin (VITAMIN B 12 PO) Take by mouth.    Historical Provider, MD  Diphenhyd-Hydrocort-Nystatin (FIRST-DUKES MOUTHWASH MT) Reported on 10/19/2015 09/28/15   Historical Provider, MD  DOCOSAHEXAENOIC ACID PO Take by mouth.    Historical Provider, MD    HYDROcodone-acetaminophen (NORCO) 5-325 MG tablet Take 1 tablet by mouth every 6 (six) hours as needed for moderate pain. 11/29/16   Duanne Guess, PA-C  hydrocortisone 2.5 % cream Reported on 10/19/2015 08/04/15   Historical Provider, MD  Ledipasvir-Sofosbuvir 90-400 MG TABS Take by mouth. Reported on 10/19/2015    Historical Provider, MD  levothyroxine (SYNTHROID, LEVOTHROID) 25 MCG tablet Take 25 mcg by mouth daily. 09/16/15   Historical Provider, MD  magic mouthwash SOLN Take 1 teaspoon  swish and swallow 4 times per day 06/12/15   Historical Provider, MD  meloxicam (MOBIC) 15 MG tablet TAKE 1 TABLET(S) EVERY DAY BY ORAL ROUTE WITH FOOD. 07/30/15   Historical Provider, MD  Multiple Vitamin (MULTI-VITAMINS) TABS Take by mouth.    Historical Provider, MD  neomycin-polymyxin b-dexamethasone (MAXITROL) 3.5-10000-0.1 SUSP Reported on 10/19/2015 09/29/15   Historical Provider, MD  Omeprazole (RA OMEPRAZOLE) 20 MG TBEC Take 20 mg by mouth. Reported on 10/19/2015 12/24/08   Historical Provider, MD  oxyCODONE-acetaminophen (ROXICET) 5-325 MG tablet Take 1 tablet by mouth every 4 (four) hours as needed for severe pain. Patient not taking: Reported on 11/08/2016 06/19/16   Sable Feil, PA-C  promethazine (PHENERGAN) 25 MG tablet Take 25 mg by mouth. Reported on 10/19/2015    Historical Provider, MD  Saw Palmetto 160 MG CAPS Take 100 mg by mouth.    Historical Provider, MD  sulfamethoxazole-trimethoprim (BACTRIM DS,SEPTRA DS) 800-160 MG tablet Take 1 tablet by mouth 2 (two) times daily. Patient not taking: Reported on 11/08/2016 06/19/16   Sable Feil, PA-C  tamsulosin (FLOMAX) 0.4 MG CAPS capsule Take 0.4 mg by mouth. Reported on 10/19/2015 12/24/08   Historical Provider, MD  traMADol (ULTRAM) 50 MG tablet Take by mouth every 6 (six) hours as needed. for pain 08/20/15   Historical Provider, MD  valACYclovir (VALTREX) 500 MG tablet Take 500 mg by mouth. 01/17/11   Historical Provider, MD    Family History Family  History  Problem Relation Age of Onset  . Breast cancer Mother   . Kidney disease Mother   . Bladder Cancer Mother   . Heart disease    . Kidney disease Cousin   . Prostate cancer Father   . Kidney cancer Neg Hx     Social History Social History  Substance Use Topics  . Smoking status: Former Research scientist (life sciences)  . Smokeless tobacco: Never Used     Comment: quit 18 years  . Alcohol use No     Allergies   Vesicare [solifenacin]   Review of Systems Review of Systems  Constitutional: Negative.  Negative for activity change, appetite change, chills and fever.  HENT: Negative for congestion, mouth sores, rhinorrhea, sinus pressure, sore throat and trouble swallowing.   Eyes: Negative for photophobia, pain and discharge.  Genitourinary: Negative for difficulty urinating and dysuria.  Musculoskeletal: Positive for arthralgias. Negative for back pain and gait problem.  Skin: Positive for wound. Negative for color change and rash.  Neurological: Negative for dizziness and headaches.  Hematological: Negative for adenopathy.  Psychiatric/Behavioral: Negative for agitation and behavioral problems.     Physical Exam Updated Vital Signs BP 107/89 (BP Location: Right Arm)   Pulse 83   Temp 98 F (36.7 C) (Oral)   Resp 20   Ht 5\' 11"  (1.803 m)   Wt 77.1 kg   SpO2 97%   BMI 23.71 kg/m   Physical Exam  Constitutional: He appears well-developed and well-nourished.  HENT:  Head: Normocephalic and atraumatic.  Eyes: Conjunctivae are normal.  Neck: Neck supple.  Cardiovascular: Normal rate and intact distal pulses.   Pulmonary/Chest: Effort normal. No respiratory distress.  Musculoskeletal: He exhibits no edema.  Examination of the left hand shows patient has full composite fist. No tendon deficits noted. Sensation is intact distally. Small one similar puncture wound to the first webspace with no sign of foreign body. Bleeding well controlled. Wound is thoroughly irrigated and bandage  applied.  Neurological: He is alert.  Skin: Skin is warm and dry.  Psychiatric: He has a normal mood and affect. His behavior is normal. Thought content normal.  Nursing note and vitals reviewed.    ED Treatments / Results  Labs (all labs ordered are listed, but only abnormal results are displayed) Labs Reviewed - No data to display  EKG  EKG Interpretation None       Radiology Dg Hand Complete Left  Result Date: 11/29/2016 CLINICAL DATA:  Puncture wound to left hand from screwdriver. Puncture between thumb and index finger. EXAM: LEFT HAND - COMPLETE 3+ VIEW COMPARISON:  None. FINDINGS: Air in the soft tissues in the thenar eminence adjacent to the second metacarpal. Skin irregularity noted with question of overlying dressing versus skin flap, no definite superficial foreign body. No acute fracture. 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 IMPRESSION: Air in the soft tissues adjacent to the second metacarpal consistent with puncture wound. Skin irregularity with question of overlying dressing versus skin flap. No definite radiopaque foreign body. No acute osseous abnormality. Electronically Signed   By: Jeb Levering M.D.   On: 11/29/2016 19:35    Procedures Procedures (including critical care time)  Medications Ordered in ED Medications  lidocaine (PF) (XYLOCAINE) 1 % injection 5 mL (not administered)  cephALEXin (KEFLEX) capsule 500 mg (not administered)  ketorolac (TORADOL) injection 30 mg (30 mg Intramuscular Given 11/29/16 1925)  Tdap (BOOSTRIX) injection 0.5 mL (0.5 mLs Intramuscular Given 11/29/16 1926)     Initial Impression / Assessment and Plan / ED Course  I have reviewed the triage vital signs and the nursing notes.  Pertinent labs & imaging results that were available during my care of the patient were reviewed by me and considered in my medical decision making (see chart for details).     61 year old male with puncture wound  to the left hand. Wound thoroughly irrigated, no sign of foreign body. No tendon or neurological deficits noted. Patient placed on antibiotics. X-ray show no sign of foreign body or fracture. He is educated on signs and symptoms to return to the ED for.  Final Clinical Impressions(s) / ED Diagnoses   Final diagnoses:  Puncture wound of left hand without foreign body, initial encounter    New Prescriptions New Prescriptions   CEPHALEXIN (KEFLEX) 500 MG CAPSULE    Take 1 capsule (500 mg total) by mouth 4 (four) times daily.   HYDROCODONE-ACETAMINOPHEN (NORCO) 5-325 MG TABLET    Take 1 tablet by mouth every 6 (six) hours as needed for moderate pain.     Duanne Guess, PA-C 11/29/16 2032    Merlyn Lot, MD 11/29/16 [phone]

## 2016-11-29 NOTE — ED Triage Notes (Signed)
Pt has puncture wound from screw driver to left hand.

## 2016-11-29 NOTE — Discharge Instructions (Signed)
Please apply antibiotic ointment to left hand wound daily. Take antibiotics as prescribed. Keep puncture wound site clean and dry for the next 7 days. Return to the ER for your primary care physician's office if any redness warmth, increased pain or swelling.

## 2016-12-03 ENCOUNTER — Emergency Department
Admission: EM | Admit: 2016-12-03 | Discharge: 2016-12-03 | Disposition: A | Discharge status: Home / Self Care | Payer: Medicaid Other | Attending: Emergency Medicine | Admitting: Emergency Medicine

## 2016-12-03 ENCOUNTER — Encounter: Payer: Self-pay | Admitting: Emergency Medicine

## 2016-12-03 DIAGNOSIS — R51 Headache: Secondary | ICD-10-CM | POA: Diagnosis present

## 2016-12-03 DIAGNOSIS — R5383 Other fatigue: Secondary | ICD-10-CM | POA: Insufficient documentation

## 2016-12-03 DIAGNOSIS — Z79899 Other long term (current) drug therapy: Secondary | ICD-10-CM | POA: Insufficient documentation

## 2016-12-03 DIAGNOSIS — Z87891 Personal history of nicotine dependence: Secondary | ICD-10-CM | POA: Insufficient documentation

## 2016-12-03 DIAGNOSIS — E039 Hypothyroidism, unspecified: Secondary | ICD-10-CM | POA: Insufficient documentation

## 2016-12-03 DIAGNOSIS — G4489 Other headache syndrome: Secondary | ICD-10-CM | POA: Insufficient documentation

## 2016-12-03 LAB — CBC
HCT: 40.5 % (ref 40.0–52.0)
Hemoglobin: 13.9 g/dL (ref 13.0–18.0)
MCH: 31.7 pg (ref 26.0–34.0)
MCHC: 34.4 g/dL (ref 32.0–36.0)
MCV: 92.2 fL (ref 80.0–100.0)
PLATELETS: 228 10*3/uL (ref 150–440)
RBC: 4.39 MIL/uL — ABNORMAL LOW (ref 4.40–5.90)
RDW: 12.9 % (ref 11.5–14.5)
WBC: 6.9 10*3/uL (ref 3.8–10.6)

## 2016-12-03 LAB — BASIC METABOLIC PANEL
Anion gap: 7 (ref 5–15)
BUN: 20 mg/dL (ref 6–20)
CO2: 24 mmol/L (ref 22–32)
CREATININE: 0.82 mg/dL (ref 0.61–1.24)
Calcium: 9.3 mg/dL (ref 8.9–10.3)
Chloride: 106 mmol/L (ref 101–111)
GFR calc Af Amer: >60 mL/min (ref >60)
Glucose, Bld: 97 mg/dL (ref 65–99)
POTASSIUM: 3.8 mmol/L (ref 3.5–5.1)
SODIUM: 137 mmol/L (ref 135–145)

## 2016-12-03 LAB — URINALYSIS, COMPLETE (UACMP) WITH MICROSCOPIC
Bacteria, UA: NONE SEEN
Bilirubin Urine: NEGATIVE
GLUCOSE, UA: NEGATIVE mg/dL
HGB URINE DIPSTICK: NEGATIVE
Ketones, ur: 5 mg/dL — AB
LEUKOCYTES UA: NEGATIVE
NITRITE: NEGATIVE
Protein, ur: NEGATIVE mg/dL
SPECIFIC GRAVITY, URINE: 1.012 (ref 1.005–1.030)
Squamous Epithelial / LPF: NONE SEEN
pH: 6 (ref 5.0–8.0)

## 2016-12-03 LAB — TROPONIN I: Troponin I: <0.03 ng/mL (ref <0.03)

## 2016-12-03 MED ORDER — IBUPROFEN 600 MG PO TABS
600.0000 mg | ORAL_TABLET | Freq: Once | ORAL | Status: AC
  Administered 2016-12-03: 600 mg via ORAL
  Filled 2016-12-03: qty 1

## 2016-12-03 MED ORDER — SODIUM CHLORIDE 0.9 % IV BOLUS (SEPSIS)
1000.0000 mL | Freq: Once | INTRAVENOUS | Status: AC
  Administered 2016-12-03: 1000 mL via INTRAVENOUS

## 2016-12-03 MED ORDER — ACETAMINOPHEN 500 MG PO TABS
1000.0000 mg | ORAL_TABLET | Freq: Once | ORAL | Status: AC
  Administered 2016-12-03: 1000 mg via ORAL
  Filled 2016-12-03: qty 2

## 2016-12-03 MED ORDER — PROCHLORPERAZINE EDISYLATE 5 MG/ML IJ SOLN
10.0000 mg | Freq: Once | INTRAMUSCULAR | Status: AC
  Administered 2016-12-03: 10 mg via INTRAVENOUS
  Filled 2016-12-03: qty 2

## 2016-12-03 MED ORDER — IBUPROFEN 600 MG PO TABS: 600.0000 mg | ORAL_TABLET | Freq: Three times a day (TID) | ORAL | 0 refills | Status: DC | PRN

## 2016-12-03 NOTE — Discharge Instructions (Signed)
Please return to the emergency department for any new or worsening symptoms such as worsening pain, if you cannot eat or drink, or for any other concerns. Otherwise follow up with your primary care physician as needed.  It was a pleasure to take care of you today, and thank you for coming to our emergency department.  If you have any questions or concerns before leaving please ask the nurse to grab me and I'm more than happy to go through your aftercare instructions again.  If you were prescribed any opioid pain medication today such as Norco, Vicodin, Percocet, morphine, hydrocodone, or oxycodone please make sure you do not drive when you are taking this medication as it can alter your ability to drive safely.  If you have any concerns once you are home that you are not improving or are in fact getting worse before you can make it to your follow-up appointment, please do not hesitate to call [phone] and come back for further evaluation.  Darel Hong MD  Results for orders placed or performed during the hospital encounter of [id number]  Basic metabolic panel  Result Value Ref Range   Sodium 137 135 - 145 mmol/L   Potassium 3.8 3.5 - 5.1 mmol/L   Chloride 106 101 - 111 mmol/L   CO2 24 22 - 32 mmol/L   Glucose, Bld 97 65 - 99 mg/dL   BUN 20 6 - 20 mg/dL   Creatinine, Ser 0.82 0.61 - 1.24 mg/dL   Calcium 9.3 8.9 - 10.3 mg/dL   GFR calc non Af Amer >60 >60 mL/min   GFR calc Af Amer >60 >60 mL/min   Anion gap 7 5 - 15  CBC  Result Value Ref Range   WBC 6.9 3.8 - 10.6 K/uL   RBC 4.39 (L) 4.40 - 5.90 MIL/uL   Hemoglobin 13.9 13.0 - 18.0 g/dL   HCT 40.5 40.0 - 52.0 %   MCV 92.2 80.0 - 100.0 fL   MCH 31.7 26.0 - 34.0 pg   MCHC 34.4 32.0 - 36.0 g/dL   RDW 12.9 11.5 - 14.5 %   Platelets 228 150 - 440 K/uL  Urinalysis, Complete w Microscopic  Result Value Ref Range   Color, Urine YELLOW (A) YELLOW   APPearance CLEAR (A) CLEAR   Specific Gravity, Urine 1.012 1.005 - 1.030   pH 6.0 5.0 - 8.0     Glucose, UA NEGATIVE NEGATIVE mg/dL   Hgb urine dipstick NEGATIVE NEGATIVE   Bilirubin Urine NEGATIVE NEGATIVE   Ketones, ur 5 (A) NEGATIVE mg/dL   Protein, ur NEGATIVE NEGATIVE mg/dL   Nitrite NEGATIVE NEGATIVE   Leukocytes, UA NEGATIVE NEGATIVE   RBC / HPF 0-5 0 - 5 RBC/hpf   WBC, UA 0-5 0 - 5 WBC/hpf   Bacteria, UA NONE SEEN NONE SEEN   Squamous Epithelial / LPF NONE SEEN NONE SEEN   Mucous PRESENT    Sperm, UA PRESENT   Troponin I  Result Value Ref Range   Troponin I <0.03 <0.03 ng/mL   Dg Hand Complete Left  Result Date: 11/29/2016 CLINICAL DATA:  Puncture wound to left hand from screwdriver. Puncture between thumb and index finger. EXAM: LEFT HAND - COMPLETE 3+ VIEW COMPARISON:  None. FINDINGS: Air in the soft tissues in the thenar eminence adjacent to the second metacarpal. Skin irregularity noted with question of overlying dressing versus skin flap, no definite superficial foreign body. No acute fracture. 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 IMPRESSION: Air  in the soft tissues adjacent to the second metacarpal consistent with puncture wound. Skin irregularity with question of overlying dressing versus skin flap. No definite radiopaque foreign body. No acute osseous abnormality. Electronically Signed   By: Jeb Levering M.D.   On: 11/29/2016 19:35

## 2016-12-03 NOTE — ED Triage Notes (Signed)
Pt c/o headache x 2 days, also c/o feeling weak and almost passing out. Pt states his head feels like it is full of rocks and that he is also "swimmy headed"  Pt states he was here on Tuesday with screwdriver through his hand and started on atbx, but quit taking due to nausea

## 2016-12-03 NOTE — ED Provider Notes (Signed)
[facility] Emergency Department Provider Note  ____________________________________________   First MD Initiated Contact with Patient 12/03/16 1636     (approximate)  I have reviewed the triage vital signs and the nursing notes.   HISTORY  Chief Complaint Near Syncope and Headache    HPI Hayden Hall is a 61 y.o. male who comes to the emergency department after a near syncopal episode today. He said he felt somewhat nauseated and lightheaded for the past several days and so he came to the emergency department. He said that once he got here he immediately felt better. He has a past medical history of chronic pain for which she takes tramadol. Today he also noted gradual onset not maximal onset moderate severity bifrontal throbbing headache similar to previous headaches. He denies chest pain or shortness of breath. He has no family history of sudden cardiac death.   Past Medical History:  Diagnosis Date  . Anxiety   . Arthritis    RA   . BPH (benign prostatic hyperplasia)   . Bronchitis   . Chronic constipation   . Chronic pain   . Depression   . Hepatitis C   . Herpes   . History of pancreatitis   . History of seizure disorder   . Hypothyroidism   . Memory impairment   . Myoclonic jerking   . Neck injury   . Nocturia   . SOB (shortness of breath)   . Trigger finger   . Urinary frequency     Patient Active Problem List   Diagnosis Date Noted  . BPH with obstruction/lower urinary tract symptoms 10/21/2015    Past Surgical History:  Procedure Laterality Date  . ROTATOR CUFF REPAIR Left   . ROTATOR CUFF REPAIR Right   . TRIGGER FINGER RELEASE      Prior to Admission medications   Medication Sig Start Date End Date Taking? Authorizing Provider  Ascorbic Acid (VITAMIN C PO) Take by mouth.   Yes Historical Provider, MD  Cyanocobalamin (VITAMIN B 12 PO) Take by mouth.   Yes Historical Provider, MD  Diphenhyd-Hydrocort-Nystatin  (FIRST-DUKES MOUTHWASH MT) Reported on 10/19/2015 09/28/15  Yes Historical Provider, MD  HYDROcodone-acetaminophen (NORCO) 5-325 MG tablet Take 1 tablet by mouth every 6 (six) hours as needed for moderate pain. 11/29/16  Yes Duanne Guess, PA-C  levothyroxine (SYNTHROID, LEVOTHROID) 25 MCG tablet Take 25 mcg by mouth daily. 09/16/15  Yes Historical Provider, MD  Multiple Vitamin (MULTI-VITAMINS) TABS Take by mouth.   Yes Historical Provider, MD  Omeprazole (RA OMEPRAZOLE) 20 MG TBEC Take 20 mg by mouth. Reported on 10/19/2015 12/24/08  Yes Historical Provider, MD  promethazine (PHENERGAN) 25 MG tablet Take 25 mg by mouth. Reported on 10/19/2015   Yes Historical Provider, MD  traMADol (ULTRAM) 50 MG tablet Take by mouth every 6 (six) hours as needed. for pain 08/20/15  Yes Historical Provider, MD  valACYclovir (VALTREX) 500 MG tablet Take 500 mg by mouth. 01/17/11  Yes Historical Provider, MD  cephALEXin (KEFLEX) 500 MG capsule Take 1 capsule (500 mg total) by mouth 4 (four) times daily. Patient not taking: Reported on 12/03/2016 11/29/16 12/06/16  Duanne Guess, PA-C  DOCOSAHEXAENOIC ACID PO Take by mouth.    Historical Provider, MD  ibuprofen (ADVIL,MOTRIN) 600 MG tablet Take 1 tablet (600 mg total) by mouth every 8 (eight) hours as needed. 12/03/16   Darel Hong, MD  oxyCODONE-acetaminophen (ROXICET) 5-325 MG tablet Take 1 tablet by mouth every 4 (four)  hours as needed for severe pain. Patient not taking: Reported on 11/08/2016 06/19/16   Sable Feil, PA-C  sulfamethoxazole-trimethoprim (BACTRIM DS,SEPTRA DS) 800-160 MG tablet Take 1 tablet by mouth 2 (two) times daily. Patient not taking: Reported on 11/08/2016 06/19/16   Sable Feil, PA-C    Allergies Vesicare [solifenacin]  Family History  Problem Relation Age of Onset  . Breast cancer Mother   . Kidney disease Mother   . Bladder Cancer Mother   . Heart disease    . Kidney disease Cousin   . Prostate cancer Father   . Kidney cancer Neg Hx      Social History Social History  Substance Use Topics  . Smoking status: Former Research scientist (life sciences)  . Smokeless tobacco: Never Used     Comment: quit 18 years  . Alcohol use No    Review of Systems Constitutional: No fever/chills Eyes: No visual changes. ENT: No sore throat. Cardiovascular: Denies chest pain. Respiratory: Denies shortness of breath. Gastrointestinal: No abdominal pain.  No nausea, no vomiting.  No diarrhea.  No constipation. Genitourinary: Negative for dysuria. Musculoskeletal: Negative for back pain. Skin: Negative for rash. Neurological: Positive for headaches, negative for focal weakness or numbness.  10-point ROS otherwise negative.  ____________________________________________   PHYSICAL EXAM:  VITAL SIGNS: ED Triage Vitals  Enc Vitals Group     BP 12/03/16 1407 139/90     Pulse Rate 12/03/16 1407 (!) 58     Resp 12/03/16 1407 18     Temp 12/03/16 1407 97 F (36.1 C)     Temp Source 12/03/16 1407 Axillary     SpO2 12/03/16 1407 100 %     Weight 12/03/16 1408 170 lb (77.1 kg)     Height 12/03/16 1408 5\' 11"  (1.803 m)     Head Circumference --      Peak Flow --      Pain Score 12/03/16 1408 5     Pain Loc --      Pain Edu? --      Excl. in [place]? --     Constitutional: Alert and oriented x 4 well appearing nontoxic no diaphoresis speaks in full, clear sentences Eyes: PERRL EOMI.He pulls midrange equal reactive Head: Atraumatic. Nose: No congestion/rhinnorhea. Mouth/Throat: No trismus Neck: No stridor.   Cardiovascular: Normal rate, regular rhythm. Grossly normal heart sounds.  Good peripheral circulation. Respiratory: Normal respiratory effort.  No retractions. Lungs CTAB and moving good air Gastrointestinal: Soft nondistended nontender no rebound no guarding no peritonitis no McBurney's tenderness negative Rovsing's no costovertebral tenderness negative Murphy's Musculoskeletal: No lower extremity edema   Neurologic:  Normal speech and language. No  gross focal neurologic deficits are appreciated. Skin:  Skin is warm, dry and intact. No rash noted. Psychiatric: Mood and affect are normal. Speech and behavior are normal.  ____________________________________________   LABS (all labs ordered are listed, but only abnormal results are displayed)  Labs Reviewed  CBC - Abnormal; Notable for the following:       Result Value   RBC 4.39 (*)    All other components within normal limits  URINALYSIS, COMPLETE (UACMP) WITH MICROSCOPIC - Abnormal; Notable for the following:    Color, Urine YELLOW (*)    APPearance CLEAR (*)    Ketones, ur 5 (*)    All other components within normal limits  BASIC METABOLIC PANEL  TROPONIN I  CBG MONITORING, ED   ____________________________________________  EKG  ED ECG REPORT I, Darel Hong, the attending physician,  personally viewed and interpreted this ECG.  Date: 12/03/2016 Rate: 53 Rhythm: Sinus bradycardia QRS Axis: normal Intervals: normal ST/T Wave abnormalities: normal Conduction Disturbances: none Narrative Interpretation: unremarkable. No blocks, no Brugada, no signs of HOCM  ____________________________________________  RADIOLOGY   ____________________________________________   PROCEDURES  Procedure(s) performed: no  Procedures  Critical Care performed: no  ____________________________________________   INITIAL IMPRESSION / ASSESSMENT AND PLAN / ED COURSE  Pertinent labs & imaging results that were available during my care of the patient were reviewed by me and considered in my medical decision making (see chart for details).  The patient arrives very well-appearing with his symptoms largely resolved. He does sound like he may have been dehydrated. As he still somewhat nauseated and has a headache Compazine will be given along with fluids and reevaluation.  Fortunately the patient is no longer symptomatic and he is medically stable for outpatient management with  follow-up with his primary care physician she verbalizes understanding and agreement with the plan.      ____________________________________________   FINAL CLINICAL IMPRESSION(S) / ED DIAGNOSES  Final diagnoses:  Other headache syndrome  Fatigue, unspecified type      NEW MEDICATIONS STARTED DURING THIS VISIT:  New Prescriptions   IBUPROFEN (ADVIL,MOTRIN) 600 MG TABLET    Take 1 tablet (600 mg total) by mouth every 8 (eight) hours as needed.     Note:  This document was prepared using Dragon voice recognition software and may include unintentional dictation errors.     Darel Hong, MD 12/03/16 Lurena Nida

## 2016-12-03 NOTE — ED Notes (Signed)
Pt states headache on and off for "years", that nausea has been on and off with it. States headache occurs when he takes his tramadol - but states tramadol is the only medicine that works for his chronic pain so has been unable to stop taking it.

## 2017-04-25 ENCOUNTER — Other Ambulatory Visit: Payer: Self-pay | Admitting: Orthopedic Surgery

## 2017-05-17 ENCOUNTER — Encounter: Payer: Self-pay | Admitting: Emergency Medicine

## 2017-05-17 ENCOUNTER — Emergency Department
Admission: EM | Admit: 2017-05-17 | Discharge: 2017-05-17 | Disposition: A | Discharge status: Home / Self Care | Payer: Medicaid Other | Attending: Emergency Medicine | Admitting: Emergency Medicine

## 2017-05-17 DIAGNOSIS — M25511 Pain in right shoulder: Secondary | ICD-10-CM | POA: Diagnosis not present

## 2017-05-17 DIAGNOSIS — R112 Nausea with vomiting, unspecified: Secondary | ICD-10-CM | POA: Insufficient documentation

## 2017-05-17 DIAGNOSIS — K859 Acute pancreatitis without necrosis or infection, unspecified: Secondary | ICD-10-CM | POA: Diagnosis not present

## 2017-05-17 DIAGNOSIS — E039 Hypothyroidism, unspecified: Secondary | ICD-10-CM | POA: Insufficient documentation

## 2017-05-17 DIAGNOSIS — R110 Nausea: Secondary | ICD-10-CM | POA: Diagnosis present

## 2017-05-17 DIAGNOSIS — K8500 Idiopathic acute pancreatitis without necrosis or infection: Secondary | ICD-10-CM

## 2017-05-17 DIAGNOSIS — Z87891 Personal history of nicotine dependence: Secondary | ICD-10-CM | POA: Insufficient documentation

## 2017-05-17 LAB — COMPREHENSIVE METABOLIC PANEL
ALBUMIN: 4.5 g/dL (ref 3.5–5.0)
ALK PHOS: 39 U/L (ref 38–126)
ALT: 18 U/L (ref 17–63)
AST: 30 U/L (ref 15–41)
Anion gap: 9 (ref 5–15)
BUN: 26 mg/dL — ABNORMAL HIGH (ref 6–20)
CALCIUM: 9.6 mg/dL (ref 8.9–10.3)
CHLORIDE: 105 mmol/L (ref 101–111)
CO2: 24 mmol/L (ref 22–32)
CREATININE: 0.86 mg/dL (ref 0.61–1.24)
GFR calc Af Amer: >60 mL/min (ref >60)
GFR calc non Af Amer: >60 mL/min (ref >60)
GLUCOSE: 145 mg/dL — AB (ref 65–99)
Potassium: 4.3 mmol/L (ref 3.5–5.1)
SODIUM: 138 mmol/L (ref 135–145)
Total Bilirubin: 0.7 mg/dL (ref 0.3–1.2)
Total Protein: 7.7 g/dL (ref 6.5–8.1)

## 2017-05-17 LAB — CBC WITH DIFFERENTIAL/PLATELET
BASOS ABS: 0 10*3/uL (ref 0–0.1)
BASOS PCT: 0 %
EOS ABS: 0 10*3/uL (ref 0–0.7)
Eosinophils Relative: 0 %
HCT: 40.7 % (ref 40.0–52.0)
HEMOGLOBIN: 13.7 g/dL (ref 13.0–18.0)
LYMPHS ABS: 0.4 10*3/uL — AB (ref 1.0–3.6)
Lymphocytes Relative: 3 %
MCH: 31.2 pg (ref 26.0–34.0)
MCHC: 33.7 g/dL (ref 32.0–36.0)
MCV: 92.7 fL (ref 80.0–100.0)
Monocytes Absolute: 0.6 10*3/uL (ref 0.2–1.0)
Monocytes Relative: 5 %
NEUTROS PCT: 92 %
Neutro Abs: 13.2 10*3/uL — ABNORMAL HIGH (ref 1.4–6.5)
PLATELETS: 275 10*3/uL (ref 150–440)
RBC: 4.39 MIL/uL — AB (ref 4.40–5.90)
RDW: 13.4 % (ref 11.5–14.5)
WBC: 14.3 10*3/uL — AB (ref 3.8–10.6)

## 2017-05-17 LAB — LIPASE, BLOOD: Lipase: 107 U/L — ABNORMAL HIGH (ref 11–51)

## 2017-05-17 MED ORDER — ONDANSETRON HCL 4 MG/2ML IJ SOLN
4.0000 mg | Freq: Once | INTRAMUSCULAR | Status: AC
  Administered 2017-05-17: 4 mg via INTRAVENOUS
  Filled 2017-05-17: qty 2

## 2017-05-17 MED ORDER — ONDANSETRON 4 MG PO TBDP: 4.0000 mg | ORAL_TABLET | Freq: Three times a day (TID) | ORAL | 0 refills | Status: DC | PRN

## 2017-05-17 MED ORDER — SODIUM CHLORIDE 0.9 % IV SOLN
Freq: Once | INTRAVENOUS | Status: AC
  Administered 2017-05-17: 08:00:00 via INTRAVENOUS

## 2017-05-17 NOTE — ED Provider Notes (Signed)
[facility] Emergency Department Provider Note       Time seen: ----------------------------------------- 7:27 AM on 05/17/2017 -----------------------------------------     I have reviewed the triage vital signs and the nursing notes.   HISTORY   Chief Complaint Medication Reaction    HPI Amauris Debois is a 61 y.o. male who presents to the ED for possible medication reaction. Patient began Suboxone yesterday prescribed by the pain clinic and is having nausea since then. He denies any abdominal pain.patient states he has chronic right shoulder pain and previous had a right rotator cuff surgery.pain is 4 out of 10 described as aching.   Past Medical History:  Diagnosis Date  . Anxiety   . Arthritis    RA   . BPH (benign prostatic hyperplasia)   . Bronchitis   . Chronic constipation   . Chronic pain   . Depression   . Hepatitis C   . Herpes   . History of pancreatitis   . History of seizure disorder   . Hypothyroidism   . Memory impairment   . Myoclonic jerking   . Neck injury   . Nocturia   . SOB (shortness of breath)   . Trigger finger   . Urinary frequency     Patient Active Problem List   Diagnosis Date Noted  . BPH with obstruction/lower urinary tract symptoms 10/21/2015    Past Surgical History:  Procedure Laterality Date  . ROTATOR CUFF REPAIR Left   . ROTATOR CUFF REPAIR Right   . TRIGGER FINGER RELEASE      Allergies Vesicare [solifenacin]  Social History Social History  Substance Use Topics  . Smoking status: Former Research scientist (life sciences)  . Smokeless tobacco: Never Used     Comment: quit 18 years  . Alcohol use No   Review of Systems Constitutional: Negative for fever. Cardiovascular: Negative for chest pain. Respiratory: Negative for shortness of breath. Gastrointestinal: Negative for abdominal pain, vomiting and diarrhea. Genitourinary: Negative for dysuria. Musculoskeletal: Negative for back pain. Skin: Negative  for rash. Neurological: Negative for headaches, focal weakness or numbness.  All systems negative/normal/unremarkable except as stated in the HPI  ____________________________________________   PHYSICAL EXAM:  VITAL SIGNS: ED Triage Vitals  Enc Vitals Group     BP 05/17/17 0607 (!) 131/95     Pulse Rate 05/17/17 0607 88     Resp 05/17/17 0607 20     Temp 05/17/17 0607 98.1 F (36.7 C)     Temp Source 05/17/17 0607 Oral     SpO2 05/17/17 0607 95 %     Weight 05/17/17 0608 174 lb (78.9 kg)     Height 05/17/17 0608 6' (1.829 m)     Head Circumference --      Peak Flow --      Pain Score 05/17/17 0607 4     Pain Loc --      Pain Edu? --      Excl. in [place]? --    Constitutional: Alert and oriented. Well appearing and in no distress. Eyes: Conjunctivae are normal. Normal extraocular movements. ENT   Head: Normocephalic and atraumatic.   Nose: No congestion/rhinnorhea.   Mouth/Throat: Mucous membranes are moist.   Neck: No stridor. Cardiovascular: Normal rate, regular rhythm. No murmurs, rubs, or gallops. Respiratory: Normal respiratory effort without tachypnea nor retractions. Breath sounds are clear and equal bilaterally. No wheezes/rales/rhonchi. Gastrointestinal: Soft and nontender. Normal bowel sounds Musculoskeletal: Nontender with normal range of motion in extremities. No lower extremity  tenderness nor edema. Neurologic:  Normal speech and language. No gross focal neurologic deficits are appreciated.  Skin:  Skin is warm, dry and intact. No rash noted. Psychiatric: Mood and affect are normal. Speech and behavior are normal.   ____________________________________________  ED COURSE:  Pertinent labs & imaging results that were available during my care of the patient were reviewed by me and considered in my medical decision making (see chart for details). Patient presents for Nausea and vomiting, we will assess with labs and imaging as indicated.    Procedures ____________________________________________   LABS (pertinent positives/negatives)  Labs Reviewed  CBC WITH DIFFERENTIAL/PLATELET - Abnormal; Notable for the following:       Result Value   WBC 14.3 (*)    RBC 4.39 (*)    Neutro Abs 13.2 (*)    Lymphs Abs 0.4 (*)    All other components within normal limits  COMPREHENSIVE METABOLIC PANEL - Abnormal; Notable for the following:    Glucose, Bld 145 (*)    BUN 26 (*)    All other components within normal limits  LIPASE, BLOOD - Abnormal; Notable for the following:    Lipase 107 (*)    All other components within normal limits   ____________________________________________  FINAL ASSESSMENT AND PLAN  Nausea and vomiting, mild pancreatitis  Plan: Patient's labs and imaging were dictated above. Patient had presented for nausea and vomiting with laboratory evidence of mild pancreatitis. It is unclear if this is from Suboxone or not. We will advise stopping this medication, he'll be given antiemetics and is encouraged to have close outpatient follow-up.   Earleen Newport, MD   Note: This note was generated in part or whole with voice recognition software. Voice recognition is usually quite accurate but there are transcription errors that can and very often do occur. I apologize for any typographical errors that were not detected and corrected.     Earleen Newport, MD 05/17/17 1003

## 2017-05-17 NOTE — ED Notes (Signed)

## 2017-05-17 NOTE — ED Triage Notes (Addendum)
Pt to triage via w/c with no distress noted; st began suboxone yesterday as rx by pain clinic and having nausea since; denies abd pain but c/o chronic right shoulder pain from previous rotator cuff surgery

## 2017-06-27 ENCOUNTER — Encounter (HOSPITAL_BASED_OUTPATIENT_CLINIC_OR_DEPARTMENT_OTHER): Payer: Self-pay

## 2017-06-27 ENCOUNTER — Ambulatory Visit (HOSPITAL_BASED_OUTPATIENT_CLINIC_OR_DEPARTMENT_OTHER): Admit: 2017-06-27 | Payer: Medicaid Other | Admitting: Orthopedic Surgery

## 2017-06-27 SURGERY — RELEASE, A1 PULLEY, FOR TRIGGER FINGER
Anesthesia: Monitor Anesthesia Care | Laterality: Left

## 2017-07-11 IMAGING — DX DG HAND COMPLETE 3+V*L*
3 series · 3 of 3 positions shown · non-contrast
Comparison: None.

CLINICAL DATA: Puncture wound to left hand from screwdriver.
Puncture between thumb and index finger.

EXAM:
LEFT HAND - COMPLETE 3+ VIEW

[hand ap]
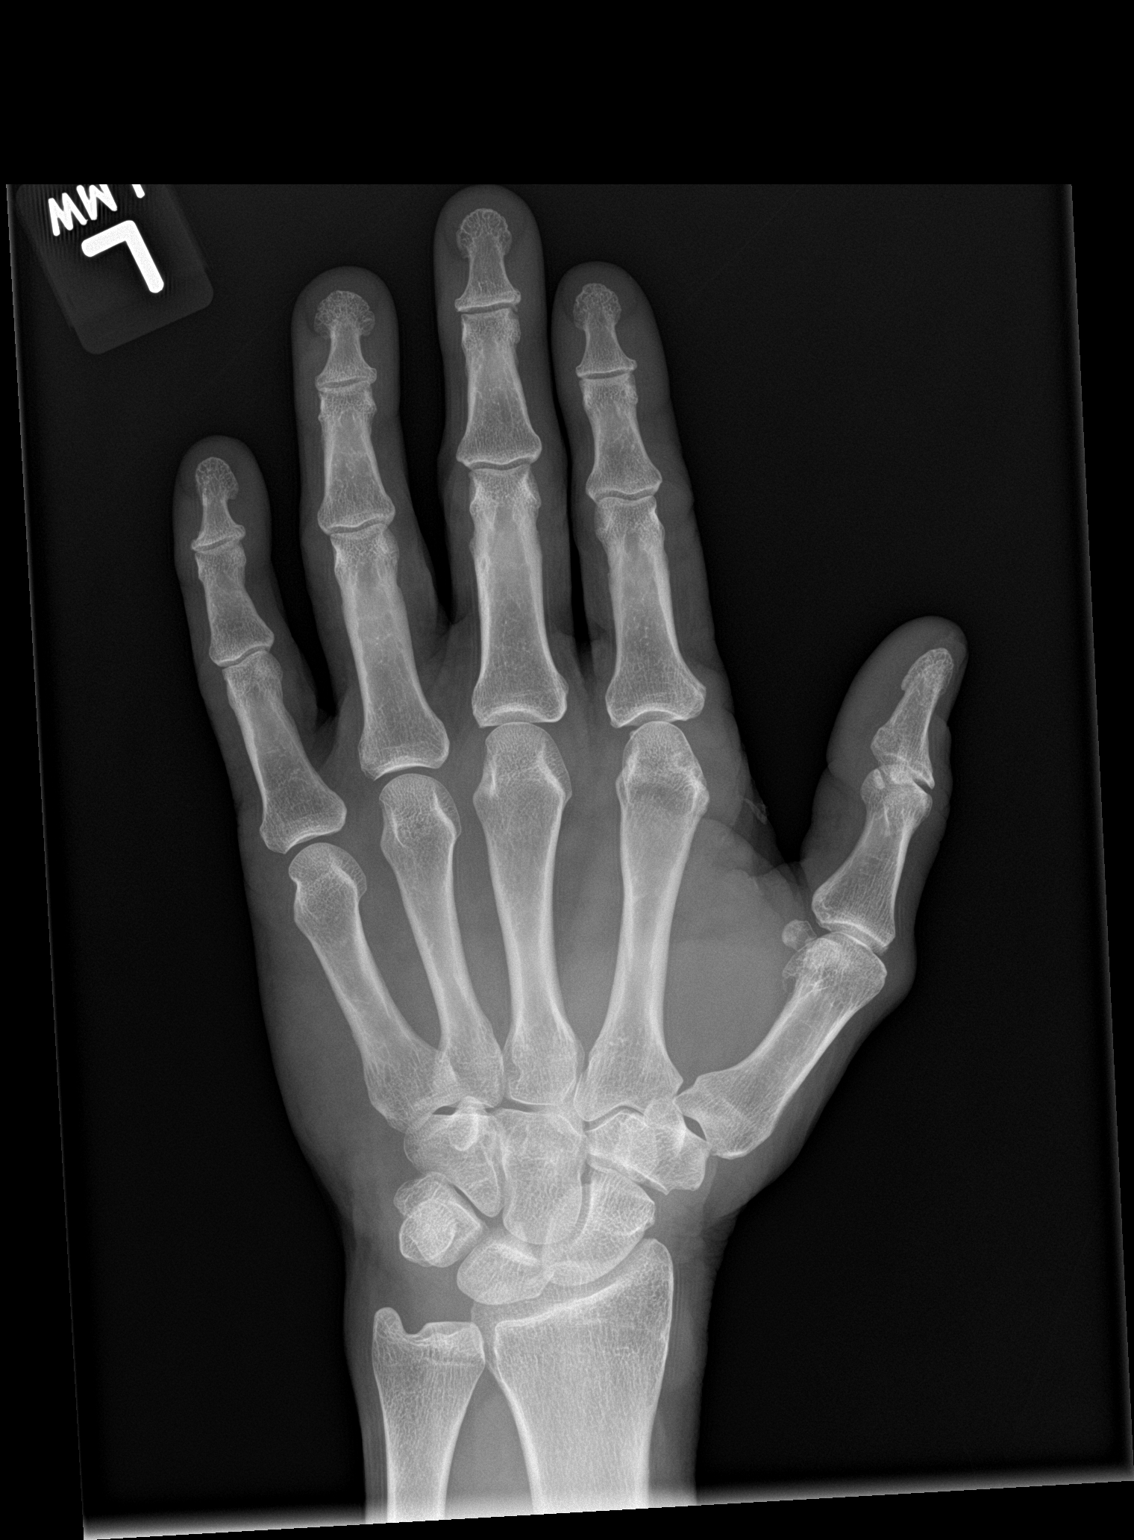

[hand obl]
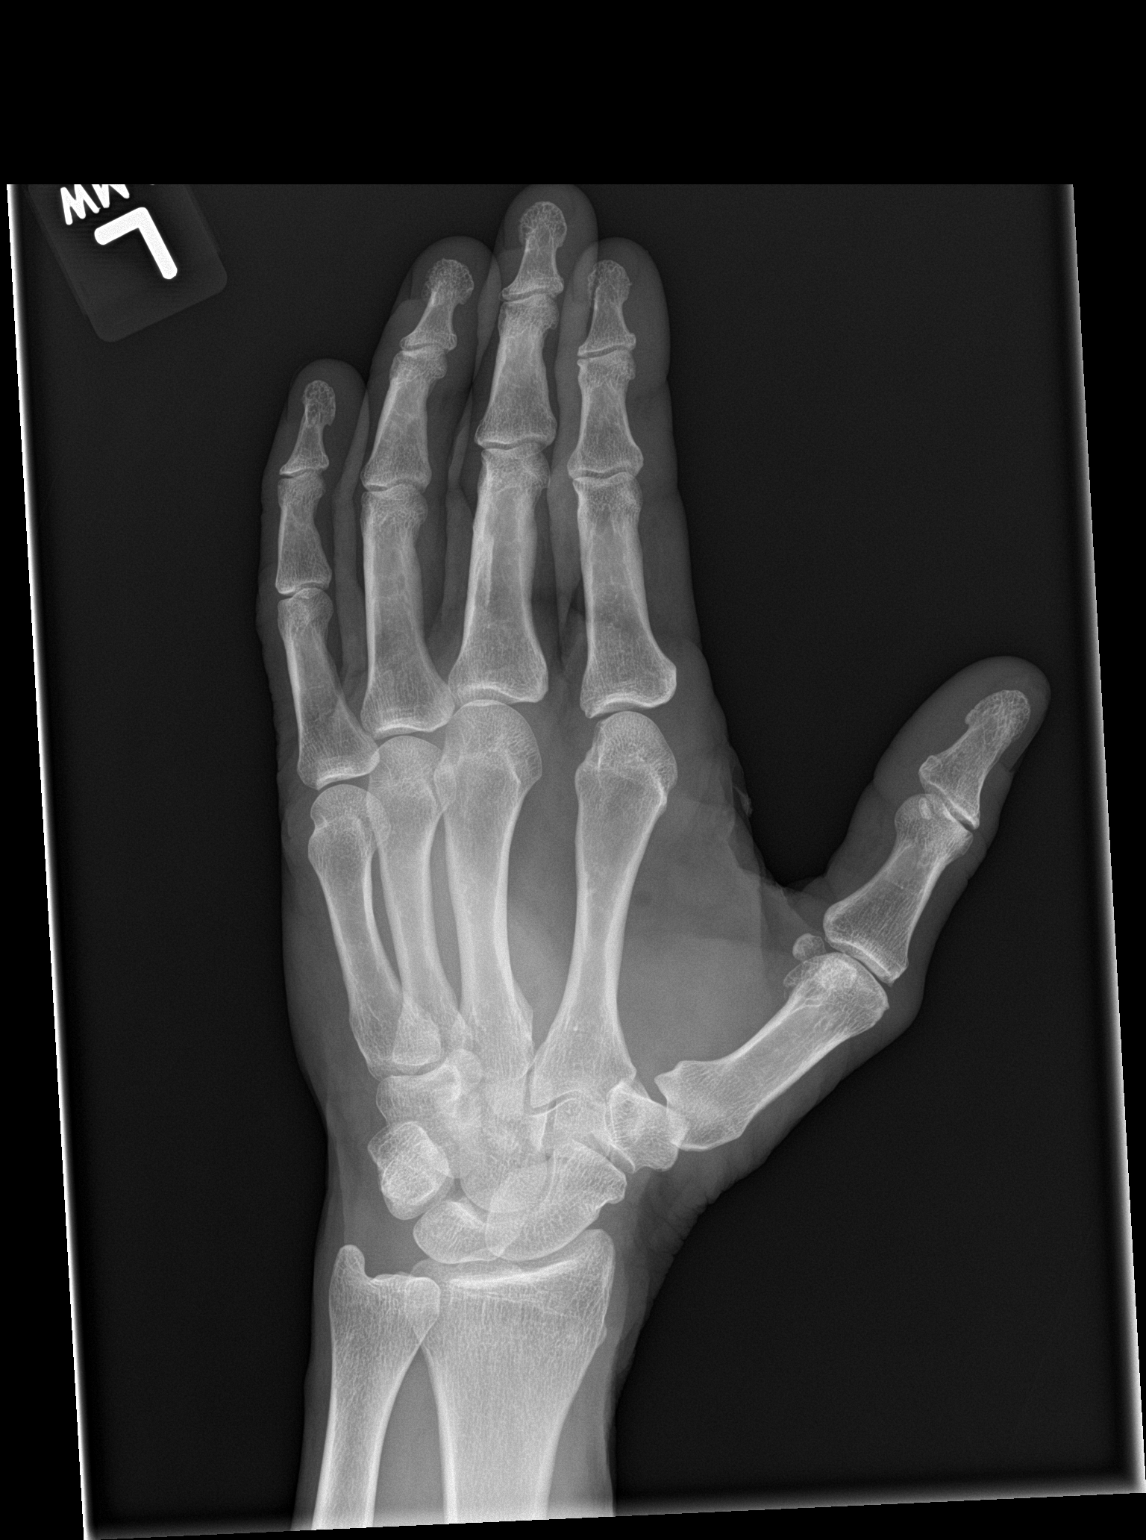

[hand lat]
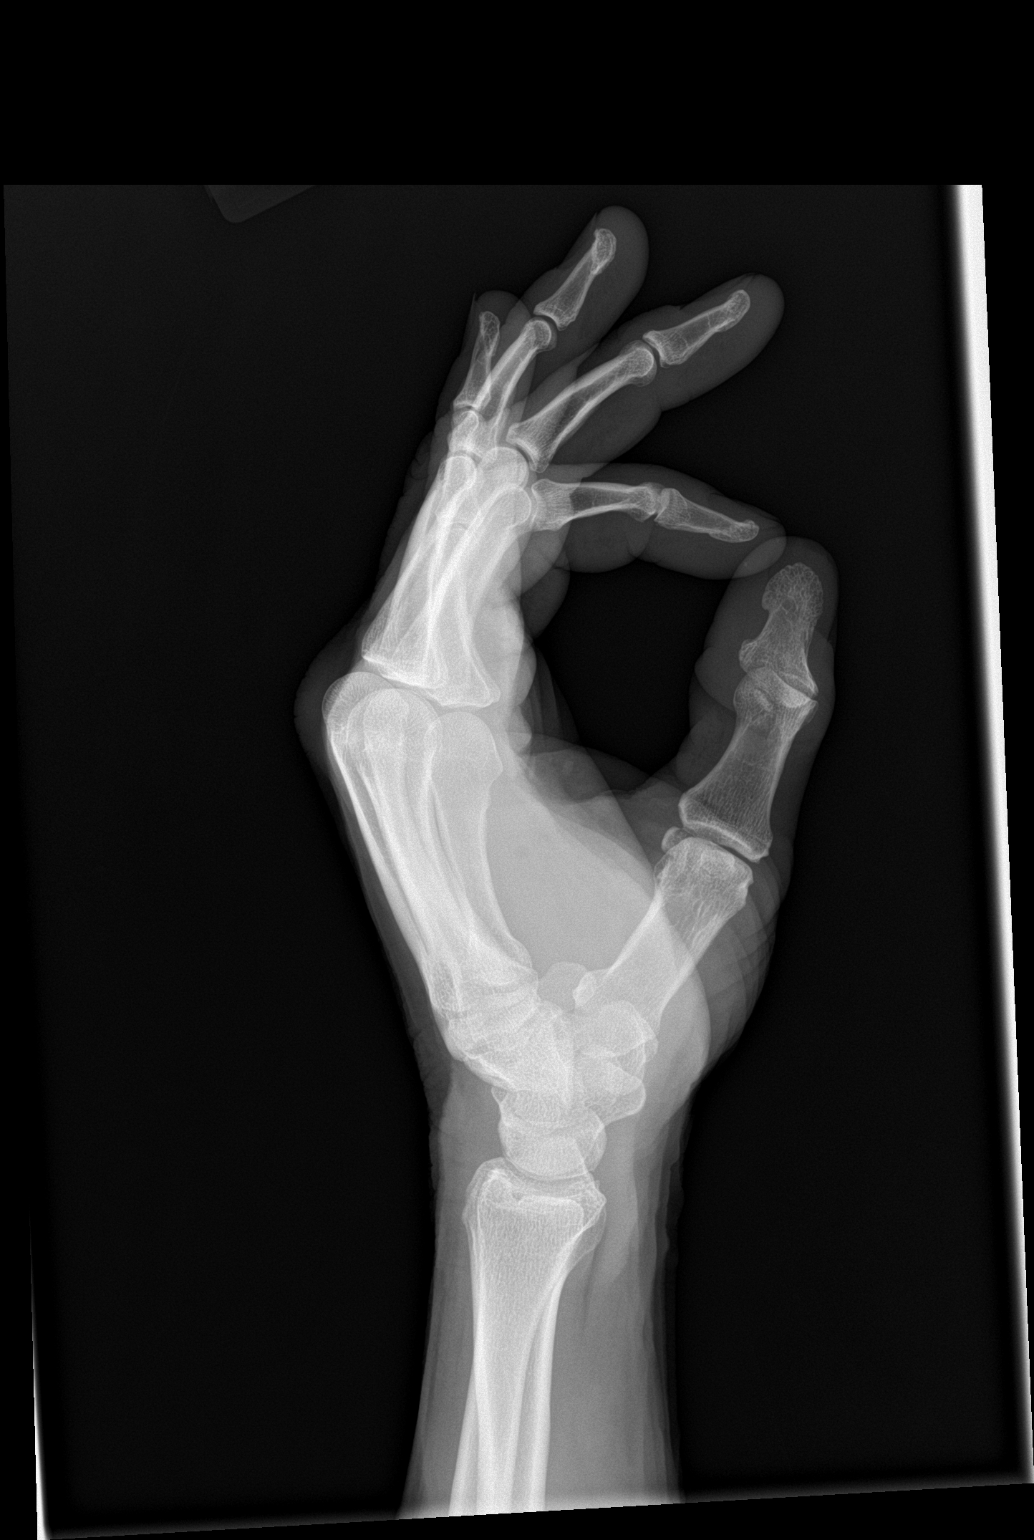

[3 of 3 positions shown; findings below may reference images not displayed]

FINDINGS: Air in the soft tissues in the thenar eminence adjacent to the
second metacarpal. Skin irregularity noted with question of
overlying dressing versus skin flap, no definite superficial foreign
body. No acute fracture. Osteoarthritis of the thumb metacarpal
phalangeal joint with osteophyte.
IMPRESSION: Air in the soft tissues adjacent to the second metacarpal consistent
with puncture wound. Skin irregularity with question of overlying
dressing versus skin flap. No definite radiopaque foreign body.

No acute osseous abnormality.

## 2017-09-29 ENCOUNTER — Other Ambulatory Visit: Payer: Self-pay | Admitting: Internal Medicine

## 2017-09-29 DIAGNOSIS — I208 Other forms of angina pectoris: Secondary | ICD-10-CM

## 2017-09-29 DIAGNOSIS — R0602 Shortness of breath: Secondary | ICD-10-CM

## 2017-10-06 ENCOUNTER — Encounter
Admission: RE | Admit: 2017-10-06 | Discharge: 2017-10-06 | Disposition: A | Discharge status: Home / Self Care | Payer: Medicaid Other | Source: Ambulatory Visit | Attending: Internal Medicine | Admitting: Internal Medicine

## 2017-10-06 DIAGNOSIS — R0602 Shortness of breath: Secondary | ICD-10-CM | POA: Insufficient documentation

## 2017-10-06 DIAGNOSIS — I208 Other forms of angina pectoris: Secondary | ICD-10-CM | POA: Diagnosis present

## 2017-10-06 LAB — NM MYOCAR MULTI W/SPECT W/WALL MOTION / EF
CHL CUP MPHR: 157 {beats}/min
CHL CUP NUCLEAR SDS: 0
CHL CUP NUCLEAR SRS: 12
CHL CUP NUCLEAR SSS: 3
CSEPED: 1 min
CSEPPHR: 91 {beats}/min
Estimated workload: 1 METS
Exercise duration (sec): 1 s
LV sys vol: 48 mL
LVDIAVOL: 108 mL (ref 62–150)
Percent HR: 57 %
Rest HR: 53 {beats}/min
TID: 0.97

## 2017-10-06 MED ORDER — TECHNETIUM TC 99M TETROFOSMIN IV KIT
32.5000 | PACK | Freq: Once | INTRAVENOUS | Status: AC | PRN
  Administered 2017-10-06: 32.5 via INTRAVENOUS

## 2017-10-06 MED ORDER — TECHNETIUM TC 99M TETROFOSMIN IV KIT
13.5070 | PACK | Freq: Once | INTRAVENOUS | Status: AC | PRN
  Administered 2017-10-06: 13.507 via INTRAVENOUS

## 2017-10-06 MED ORDER — REGADENOSON 0.4 MG/5ML IV SOLN
0.4000 mg | Freq: Once | INTRAVENOUS | Status: AC
Start: 2017-10-06 — End: 2017-10-06
  Administered 2017-10-06: 0.4 mg via INTRAVENOUS

## 2017-12-07 ENCOUNTER — Ambulatory Visit
Payer: Medicaid Other | Attending: Student in an Organized Health Care Education/Training Program | Admitting: Student in an Organized Health Care Education/Training Program

## 2017-12-07 ENCOUNTER — Other Ambulatory Visit: Payer: Self-pay

## 2017-12-07 ENCOUNTER — Encounter: Payer: Self-pay | Admitting: Student in an Organized Health Care Education/Training Program

## 2017-12-07 VITALS — BP 127/86 | HR 58 | Temp 97.9°F | Ht 71.0 in | Wt 179.0 lb

## 2017-12-07 DIAGNOSIS — F419 Anxiety disorder, unspecified: Secondary | ICD-10-CM | POA: Insufficient documentation

## 2017-12-07 DIAGNOSIS — M79671 Pain in right foot: Secondary | ICD-10-CM | POA: Insufficient documentation

## 2017-12-07 DIAGNOSIS — Z79890 Hormone replacement therapy (postmenopausal): Secondary | ICD-10-CM | POA: Insufficient documentation

## 2017-12-07 DIAGNOSIS — M5030 Other cervical disc degeneration, unspecified cervical region: Secondary | ICD-10-CM

## 2017-12-07 DIAGNOSIS — M25511 Pain in right shoulder: Secondary | ICD-10-CM | POA: Insufficient documentation

## 2017-12-07 DIAGNOSIS — G894 Chronic pain syndrome: Secondary | ICD-10-CM

## 2017-12-07 DIAGNOSIS — E039 Hypothyroidism, unspecified: Secondary | ICD-10-CM | POA: Insufficient documentation

## 2017-12-07 DIAGNOSIS — M542 Cervicalgia: Secondary | ICD-10-CM

## 2017-12-07 DIAGNOSIS — Z9889 Other specified postprocedural states: Secondary | ICD-10-CM

## 2017-12-07 DIAGNOSIS — N401 Enlarged prostate with lower urinary tract symptoms: Secondary | ICD-10-CM | POA: Diagnosis not present

## 2017-12-07 DIAGNOSIS — G40909 Epilepsy, unspecified, not intractable, without status epilepticus: Secondary | ICD-10-CM | POA: Insufficient documentation

## 2017-12-07 DIAGNOSIS — Z87891 Personal history of nicotine dependence: Secondary | ICD-10-CM | POA: Insufficient documentation

## 2017-12-07 DIAGNOSIS — K5909 Other constipation: Secondary | ICD-10-CM | POA: Diagnosis not present

## 2017-12-07 DIAGNOSIS — M5412 Radiculopathy, cervical region: Secondary | ICD-10-CM | POA: Insufficient documentation

## 2017-12-07 DIAGNOSIS — B1920 Unspecified viral hepatitis C without hepatic coma: Secondary | ICD-10-CM | POA: Diagnosis not present

## 2017-12-07 DIAGNOSIS — Z79899 Other long term (current) drug therapy: Secondary | ICD-10-CM | POA: Diagnosis not present

## 2017-12-07 DIAGNOSIS — M5010 Cervical disc disorder with radiculopathy, unspecified cervical region: Secondary | ICD-10-CM | POA: Diagnosis not present

## 2017-12-07 DIAGNOSIS — F329 Major depressive disorder, single episode, unspecified: Secondary | ICD-10-CM | POA: Diagnosis not present

## 2017-12-07 DIAGNOSIS — M25512 Pain in left shoulder: Secondary | ICD-10-CM | POA: Diagnosis not present

## 2017-12-07 NOTE — Patient Instructions (Addendum)
You have been instructed to get MRI of shoulder and cervical area.  See medical psychologist and then call for a follow up appointment after these things are completed,

## 2017-12-07 NOTE — Progress Notes (Signed)
Patient's Name: Hayden Hall  MRN: [id number]  Referring Provider: Gennette Pac, FNP  DOB: November 29, 1955  PCP: System, Pcp Not In  DOS: 12/07/2017  Note by: Gillis Santa, MD  Service setting: Ambulatory outpatient  Specialty: Interventional Pain Management  Location: [facility] (AMB) Pain Management Facility  Visit type: Initial Patient Evaluation  Patient type: New Patient   Primary Reason(s) for Visit: Encounter for initial evaluation of one or more chronic problems (new to examiner) potentially causing chronic pain, and posing a threat to normal musculoskeletal function. (Level of risk: High) CC: Other (foot)  HPI  [name] is a 62 y.o. year old, male patient, who comes today to see [place] for the first time for an initial evaluation of his chronic pain. He has BPH with obstruction/lower urinary tract symptoms; Cervicalgia; History of rotator cuff surgery; Cervical radiculopathy; DDD (degenerative disc disease), cervical; and Chronic pain syndrome on their problem list. Today he comes in for evaluation of his Other (foot)  Pain Assessment: Location: Right Foot Radiating: Radiates up to right knee Onset: More than a month ago Duration: Chronic pain Quality: Sharp, Dull, Burning, Numbness Severity: 4 /10 (self-reported pain score)  Note: Reported level is compatible with observation.                         When using our objective Pain Scale, levels between 6 and 10/10 are said to belong in an emergency room, as it progressively worsens from a 6/10, described as severely limiting, requiring emergency care not usually available at an outpatient pain management facility. At a 6/10 level, communication becomes difficult and requires great effort. Assistance to reach the emergency department may be required. Facial flushing and profuse sweating along with potentially dangerous increases in heart rate and blood pressure will be evident. Effect on ADL: Prevent him from walking and shopping. Timing:  Constant Modifying factors: Denies  Onset and Duration: Present longer than 3 months 40 years Cause of pain: Unknown Severity: Getting worse Timing: Not influenced by the time of the day and During activity or exercise Aggravating Factors: none listed Alleviating Factors: Stretching, Resting, Relaxation therapy and Walking Associated Problems: Day-time cramps, Numbness, Tingling, Weakness, Pain that wakes patient up and Pain that does not allow patient to sleep Quality of Pain: Aching, Agonizing, Annoying, Burning, Constant, Cramping, Cruel, Disabling, Distressing, Dull, Getting shorter and Superficial Previous Examinations or Tests: Epidurogram, MRI scan, Spinal tap and X-rays Previous Treatments: Trigger point injections  The patient comes into the clinics today for the first time for a chronic pain management evaluation.   62 year old male who presents with a chief complaint of neck pain, right heel pain, right shoulder pain.  Patient has a history of 2 bilateral rotator cuff surgeries.  He states that he is continuing to have severe neck pain that radiates down into his right shoulder and right arm.  Patient also endorses issues with fine motor control and has had problems dropping objects in his left hand.  He states that he is scheduled for orthopedic surgery.  He has tried various medications in the past including gabapentin which makes him sedated even at the lowest dose, meloxicam and other NSAIDs which upset his stomach, Tylenol which does nothing.  Today I took the time to provide the patient with information regarding my pain practice. The patient was informed that my practice is divided into two sections: an interventional pain management section, as well as a completely separate and distinct medication management  section. I explained that I have procedure days for my interventional therapies, and evaluation days for follow-ups and medication management. Because of the amount of  documentation required during both, they are kept separated. This means that there is the possibility that he may be scheduled for a procedure on one day, and medication management the next. I have also informed him that because of staffing and facility limitations, I no longer take patients for medication management only. To illustrate the reasons for this, I gave the patient the example of surgeons, and how inappropriate it would be to refer a patient to his/her care, just to write for the post-surgical antibiotics on a surgery done by a different surgeon.   Because interventional pain management is my board-certified specialty, the patient was informed that joining my practice means that they are open to any and all interventional therapies. I made it clear that this does not mean that they will be forced to have any procedures done. What this means is that I believe interventional therapies to be essential part of the diagnosis and proper management of chronic pain conditions. Therefore, patients not interested in these interventional alternatives will be better served under the care of a different practitioner.  The patient was also made aware of my Comprehensive Pain Management Safety Guidelines where by joining my practice, they limit all of their nerve blocks and joint injections to those done by our practice, for as long as we are retained to manage their care.   Historic Controlled Substance Pharmacotherapy Review  PMP and historical list of controlled substances: Oxycodone 5 mg, quantity 90, last fill 10/04/2017 MME/day: 22.5 mg/day Medications: The patient did not bring the medication(s) to the appointment, as requested in our "New Patient Package" Pharmacodynamics: Desired effects: Analgesia: The patient reports >50% benefit. Reported improvement in function: The patient reports medication allows him to accomplish basic ADLs. Clinically meaningful improvement in function (CMIF): Sustained  CMIF goals met Perceived effectiveness: Described as relatively effective, allowing for increase in activities of daily living (ADL) Undesirable effects: Side-effects or Adverse reactions: None reported Historical Monitoring: The patient  reports that he does not use drugs. List of all UDS Test(s): No results found for: MDMA, COCAINSCRNUR, [place], [place], CANNABQUANT, [place], [place] List of other Serum/Urine Drug Screening Test(s):  No results found for: AMPHSCRSER, BARBSCRSER, BENZOSCRSER, COCAINSCRSER, COCAINSCRNUR, PCPSCRSER, PCPQUANT, THCSCRSER, THCU, CANNABQUANT, OPIATESCRSER, OXYSCRSER, PROPOXSCRSER, ETH Historical Background Evaluation: [place] PMP: Six (6) year initial data search conducted.             [place] Department of public safety, offender search: Editor, commissioning Information) Non-contributory Risk Assessment Profile: Aberrant behavior: None observed or detected today Risk factors for fatal opioid overdose: None identified today Fatal overdose hazard ratio (HR): Calculation deferred Non-fatal overdose hazard ratio (HR): Calculation deferred Risk of opioid abuse or dependence: 0.7-3.0% with doses ? 36 MME/day and 6.1-26% with doses ? 120 MME/day. Substance use disorder (SUD) risk level: Pending results of Medical Psychology Evaluation for SUD Opioid risk tool (ORT) (Total Score):    ORT Scoring interpretation table:  Score <3 = Low Risk for SUD  Score between 4-7 = Moderate Risk for SUD  Score >8 = High Risk for Opioid Abuse   PHQ-2 Depression Scale:  Total score: 2  PHQ-2 Scoring interpretation table: (Score and probability of major depressive disorder)  Score 0 = No depression  Score 1 = 15.4% Probability  Score 2 = 21.1% Probability  Score 3 = 38.4% Probability  Score 4 = 45.5%  Probability  Score 5 = 56.4% Probability  Score 6 = 78.6% Probability   PHQ-9 Depression Scale:  Total score: 3  PHQ-9 Scoring interpretation table:  Score 0-4 = No depression  Score 5-9 = Mild  depression  Score 10-14 = Moderate depression  Score 15-19 = Moderately severe depression  Score 20-27 = Severe depression (2.4 times higher risk of SUD and 2.89 times higher risk of overuse)   Pharmacologic Plan: As per protocol, I have not taken over any controlled substance management, pending the results of ordered tests and/or consults.            Initial impression: Pending review of available data and ordered tests.  Meds   Current Outpatient Medications:  .  Ascorbic Acid (VITAMIN C PO), Take by mouth., Disp: , Rfl:  .  Cyanocobalamin (VITAMIN B 12 PO), Take by mouth., Disp: , Rfl:  .  Diphenhyd-Hydrocort-Nystatin (FIRST-DUKES MOUTHWASH MT), Reported on 10/19/2015, Disp: , Rfl: 6 .  levothyroxine (SYNTHROID, LEVOTHROID) 25 MCG tablet, Take 25 mcg by mouth daily., Disp: , Rfl: 4 .  Multiple Vitamin (MULTI-VITAMINS) TABS, Take by mouth., Disp: , Rfl:  .  Omeprazole (RA OMEPRAZOLE) 20 MG TBEC, Take 20 mg by mouth. Reported on 10/19/2015, Disp: , Rfl:  .  valACYclovir (VALTREX) 500 MG tablet, Take 500 mg by mouth., Disp: , Rfl:  .  DOCOSAHEXAENOIC ACID PO, Take by mouth., Disp: , Rfl:  .  promethazine (PHENERGAN) 25 MG tablet, Take 25 mg by mouth. Reported on 10/19/2015, Disp: , Rfl:  .  sulfamethoxazole-trimethoprim (BACTRIM DS,SEPTRA DS) 800-160 MG tablet, Take 1 tablet by mouth 2 (two) times daily. (Patient not taking: Reported on 11/08/2016), Disp: 20 tablet, Rfl: 0 .  traMADol (ULTRAM) 50 MG tablet, Take by mouth every 6 (six) hours as needed. for pain, Disp: , Rfl: 3  Imaging Review  EXAM:MRI right shoulder without contrast DATE: 01/17/16 11:31:17 ACCESSION: [id number] UN DICTATED: 01/18/16 14:29:59 INTERPRETATION LOCATION: [facility]  CLINICAL INDICATION: 62 Year Old (M): M25.511 - Bilateral shoulder pain, unspecified chronicity. eval rotator cuff tear  COMPARISON: Bilateral shoulder radiographs of 01/06/16  TECHNIQUE: MRI of the right shoulder was performed without  administration of IV contrast using a local coil. Multisequence, multiplanar images were obtained.  FINDINGS:  The acromion has type I morphology with subacromial spurring.The coracoacromial ligament is normal.The acromioclavicular joint demonstrates moderate osseous spurring, capsular thickening and joint space narrowing.There is a small amount of fluid in the subacromial-subdeltoid bursa.  There is a full-thickness, full-width tear of the supraspinatus tendon with retraction of the tendon fibers to the level of the acromioclavicular joint. There is articular surface fraying of the distal infraspinatus tendon at its insertion with regions of interstitial high and intermediate T2 signal intensity in a mildly thickened tendon. There is moderate thickening and intermediate signal intensity within the subscapularis tendon.Rotator cuff muscle bulk is normal.The long head of biceps tendon is intact; however, there is mildly increased thickness and intermediate signal intensity in the biceps tendon in the bicipital groove.  Cystic changes are seen at the greater tuberosity. On this nonarthrographic examination, the anterior superior glenoid labrum demonstrates a mildly globular contour and intermediate signal intensity.The glenohumeral articular cartilage is mildly thinned.  There is a small effusion.  IMPRESSION: -- Full-thickness, full width tear of the supraspinatus tendon with retraction to the level of the acromioclavicular joint. -- Articular surface fraying with interstitial tear and underlying mild tendinosis of the infraspinatus. -- Moderate subscapularis tendinosis. -- Mild biceps tendinosis. -- Moderate  acromioclavicular osteoarthritis. -- Mild glenohumeral osteoarthrosis. -- Degenerative changes in the anterior superior labrum.  ROS  Cardiovascular History: No reported cardiovascular signs or symptoms such as High blood pressure, coronary artery disease, abnormal heart rate or  rhythm, heart attack, blood thinner therapy or heart weakness and/or failure Pulmonary or Respiratory History: Coughing up mucus (Bronchitis) Neurological History: Curved spine Review of Past Neurological Studies: No results found for this or any previous visit. Psychological-Psychiatric History: No reported psychological or psychiatric signs or symptoms such as difficulty sleeping, anxiety, depression, delusions or hallucinations (schizophrenial), mood swings (bipolar disorders) or suicidal ideations or attempts Gastrointestinal History: Reflux or heatburn and Inflamed pancreas (Pancreatitis) Genitourinary History: No reported renal or genitourinary signs or symptoms such as difficulty voiding or producing urine, peeing blood, non-functioning kidney, kidney stones, difficulty emptying the bladder, difficulty controlling the flow of urine, or chronic kidney disease Hematological History: No reported hematological signs or symptoms such as prolonged bleeding, low or poor functioning platelets, bruising or bleeding easily, hereditary bleeding problems, low energy levels due to low hemoglobin or being anemic Endocrine History: Slow thyroid Rheumatologic History: Joint aches and or swelling due to excess weight (Osteoarthritis) and Rheumatoid arthritis Musculoskeletal History: Negative for myasthenia gravis, muscular dystrophy, multiple sclerosis or malignant hyperthermia Work History: Quit going to work on his/her own  Allergies  Mr. Russett is allergic to vesicare [solifenacin].  Laboratory Chemistry  Inflammation Markers (CRP: Acute Phase) (ESR: Chronic Phase) No results found for: CRP, ESRSEDRATE, LATICACIDVEN                       Rheumatology Markers No results found for: RF, ANA, LABURIC, URICUR, LYMEIGGIGMAB, LYMEABIGMQN              Renal Function Markers Lab Results  Component Value Date   BUN 26 (H) 05/17/2017   CREATININE 0.86 05/17/2017   GFRAA >60 05/17/2017   GFRNONAA >60  05/17/2017                 Hepatic Function Markers Lab Results  Component Value Date   AST 30 05/17/2017   ALT 18 05/17/2017   ALBUMIN 4.5 05/17/2017   ALKPHOS 39 05/17/2017   LIPASE 107 (H) 05/17/2017                 Electrolytes Lab Results  Component Value Date   NA 138 05/17/2017   K 4.3 05/17/2017   CL 105 05/17/2017   CALCIUM 9.6 05/17/2017                        Neuropathy Markers No results found for: VITAMINB12, FOLATE, HGBA1C, HIV               Bone Pathology Markers No results found for: VD25OH, VD125OH2TOT, VC9449QP5, FF6384YK5, 25OHVITD1, 25OHVITD2, 25OHVITD3, TESTOFREE, TESTOSTERONE                       Coagulation Parameters Lab Results  Component Value Date   PLT 275 05/17/2017                 Cardiovascular Markers Lab Results  Component Value Date   TROPONINI <0.03 12/03/2016   HGB 13.7 05/17/2017   HCT 40.7 05/17/2017                 CA Markers No results found for: CEA, CA125, LABCA2  Note: Lab results reviewed.  [place]  Drug: Mr. Sek  reports that he does not use drugs. Alcohol:  reports that he does not drink alcohol. Tobacco:  reports that he has quit smoking. he has never used smokeless tobacco. Medical:  has a past medical history of Anxiety, Arthritis, BPH (benign prostatic hyperplasia), Bronchitis, Chronic constipation, Chronic pain, Depression, Hepatitis C, Herpes, History of pancreatitis, History of seizure disorder, Hypothyroidism, Memory impairment, Myoclonic jerking, Neck injury, Nocturia, SOB (shortness of breath), Trigger finger, and Urinary frequency. Family: family history includes Bladder Cancer in his mother; Breast cancer in his mother; Heart disease in his unknown relative; Kidney disease in his cousin and mother; Prostate cancer in his father.  Past Surgical History:  Procedure Laterality Date  . ROTATOR CUFF REPAIR Left   . ROTATOR CUFF REPAIR Right   . TRIGGER FINGER RELEASE     Active Ambulatory  Problems    Diagnosis Date Noted  . BPH with obstruction/lower urinary tract symptoms 10/21/2015  . Cervicalgia 12/07/2017  . History of rotator cuff surgery 12/07/2017  . Cervical radiculopathy 12/07/2017  . DDD (degenerative disc disease), cervical 12/07/2017  . Chronic pain syndrome 12/07/2017   Resolved Ambulatory Problems    Diagnosis Date Noted  . No Resolved Ambulatory Problems   Past Medical History:  Diagnosis Date  . Anxiety   . Arthritis   . BPH (benign prostatic hyperplasia)   . Bronchitis   . Chronic constipation   . Chronic pain   . Depression   . Hepatitis C   . Herpes   . History of pancreatitis   . History of seizure disorder   . Hypothyroidism   . Memory impairment   . Myoclonic jerking   . Neck injury   . Nocturia   . SOB (shortness of breath)   . Trigger finger   . Urinary frequency    Constitutional Exam  General appearance: Well nourished, well developed, and well hydrated. In no apparent acute distress Vitals:   12/07/17 1118  BP: 127/86  Pulse: (!) 58  Temp: 97.9 F (36.6 C)  SpO2: 99%  Weight: 179 lb (81.2 kg)  Height: _0  (1.803 m)   BMI Assessment: Estimated body mass index is 24.97 kg/m as calculated from the following:   Height as of this encounter: _1  (1.803 m).   Weight as of this encounter: 179 lb (81.2 kg).  BMI interpretation table: BMI level Category Range association with higher incidence of chronic pain  <18 kg/m2 Underweight   18.5-24.9 kg/m2 Ideal body weight   25-29.9 kg/m2 Overweight Increased incidence by 20%  30-34.9 kg/m2 Obese (Class I) Increased incidence by 68%  35-39.9 kg/m2 Severe obesity (Class II) Increased incidence by 136%  >40 kg/m2 Extreme obesity (Class III) Increased incidence by 254%   BMI Readings from Last 4 Encounters:  12/07/17 24.97 kg/m  05/17/17 23.60 kg/m  12/03/16 23.71 kg/m  11/29/16 23.71 kg/m   Wt Readings from Last 4 Encounters:  12/07/17 179 lb (81.2 kg)  05/17/17  174 lb (78.9 kg)  12/03/16 170 lb (77.1 kg)  11/29/16 170 lb (77.1 kg)  Psych/Mental status: Alert, oriented x 3 (person, place, & time)       Eyes: PERLA Respiratory: No evidence of acute respiratory distress  Cervical Spine Area Exam  Skin & Axial Inspection: No masses, redness, edema, swelling, or associated skin lesions Alignment: Symmetrical Functional ROM: Diminished ROM, bilaterally Stability: No instability detected Muscle Tone/Strength: Functionally intact. No obvious neuro-muscular anomalies detected.  Sensory (Neurological): Dermatomal pain pattern Palpation: Complains of area being tender to palpation Positive provocative maneuver for for cervical facet disease  Upper Extremity (UE) Exam    Side: Right upper extremity  Side: Left upper extremity  Skin & Extremity Inspection: Evidence of prior arthroplastic surgery  Skin & Extremity Inspection: Evidence of prior arthroplastic surgery  Functional ROM: Decreased ROM for shoulder  Functional ROM: Decreased ROM for shoulder  Muscle Tone/Strength: Functionally intact. No obvious neuro-muscular anomalies detected.  Muscle Tone/Strength: Functionally intact. No obvious neuro-muscular anomalies detected.  Sensory (Neurological): Arthropathic arthralgia          Sensory (Neurological): Arthropathic arthralgia          Palpation: No palpable anomalies              Palpation: No palpable anomalies              Specialized Test(s): Deferred         Specialized Test(s): Deferred          Thoracic Spine Area Exam  Skin & Axial Inspection: No masses, redness, or swelling Alignment: Symmetrical Functional ROM: Unrestricted ROM Stability: No instability detected Muscle Tone/Strength: Functionally intact. No obvious neuro-muscular anomalies detected. Sensory (Neurological): Unimpaired Muscle strength & Tone: No palpable anomalies  Lumbar Spine Area Exam  Skin & Axial Inspection: No masses, redness, or swelling Alignment:  Symmetrical Functional ROM: Unrestricted ROM      Stability: No instability detected Muscle Tone/Strength: Functionally intact. No obvious neuro-muscular anomalies detected. Sensory (Neurological): Unimpaired Palpation: No palpable anomalies       Provocative Tests: Lumbar Hyperextension and rotation test: Positive bilaterally for facet joint pain. Lumbar Lateral bending test: evaluation deferred today       Patrick's Maneuver: evaluation deferred today                    Gait & Posture Assessment  Ambulation: Unassisted Gait: Relatively normal for age and body habitus Posture: WNL   Lower Extremity Exam    Side: Right lower extremity  Side: Left lower extremity  Skin & Extremity Inspection: Skin color, temperature, and hair growth are WNL. No peripheral edema or cyanosis. No masses, redness, swelling, asymmetry, or associated skin lesions. No contractures.  Skin & Extremity Inspection: Skin color, temperature, and hair growth are WNL. No peripheral edema or cyanosis. No masses, redness, swelling, asymmetry, or associated skin lesions. No contractures.  Functional ROM: Unrestricted ROM          Functional ROM: Unrestricted ROM          Muscle Tone/Strength: Functionally intact. No obvious neuro-muscular anomalies detected.  Muscle Tone/Strength: Functionally intact. No obvious neuro-muscular anomalies detected.  Sensory (Neurological): Unimpaired  Sensory (Neurological): Unimpaired  Palpation: No palpable anomalies  Palpation: No palpable anomalies   Assessment  Primary Diagnosis & Pertinent Problem List: The primary encounter diagnosis was Neck pain. Diagnoses of Cervicalgia, History of rotator cuff surgery, Cervical radiculopathy, DDD (degenerative disc disease), cervical, and Chronic pain syndrome were also pertinent to this visit.  Visit Diagnosis (New problems to examiner): 1. Neck pain   2. Cervicalgia   3. History of rotator cuff surgery   4. Cervical radiculopathy   5. DDD  (degenerative disc disease), cervical   6. Chronic pain syndrome   General Recommendations: The pain condition that the patient suffers from is best treated with a multidisciplinary approach that involves an increase in physical activity to prevent de-conditioning and worsening of the pain cycle, as well as  psychological counseling (formal and/or informal) to address the co-morbid psychological affects of pain. Treatment will often involve judicious use of pain medications and interventional procedures to decrease the pain, allowing the patient to participate in the physical activity that will ultimately produce long-lasting pain reductions. The goal of the multidisciplinary approach is to return the patient to a higher level of overall function and to restore their ability to perform activities of daily living.  62 year old male who presents with a chief complaint of neck pain, right heel pain, right shoulder pain.  Patient has a history of 2 bilateral rotator cuff surgeries.  He states that he is continuing to have severe neck pain that radiates down into his right shoulder and right arm. Patient also endorses issues with fine motor control and has had problems dropping objects in his left hand. He has tried various medications in the past including gabapentin which makes him sedated even at the lowest dose, meloxicam and other NSAIDs which upset his stomach, Tylenol which does nothing.  To further evaluate the patient's neck pain that radiates down his bilateral arms, right greater than left, discussed obtaining cervical MRI without contrast.  We will also obtain MRI of his right shoulder since the patient states that his right shoulder pain has gotten significantly worse since his last MRI in 2017 which showed a full-thickness full width tear of the supraspinatus tendon with retraction to the level of the [facility] joint along with a interstitial tear underlying mild tendinosis of the infraspinatus along with  biceps tendinosis, AC osteoarthritis, glenohumeral arthritis.  To be considered for opioid therapy, we discussed steps that are involved.  Patient must complete a urine drug screen today.  I will also send him to pain psychology for evaluation of risk of substance abuse disorder.  Pending results from both of these, I will consider taking the patient over.  Can consider oxycodone 5 mg, quantity 45-22-month  Plan: -UDS today -Pain psych referral -MRI of neck to evaluate for cervical radiculopathy -MRI of right shoulder to evaluate for interval progression of supraspinatus tendon tear  Note: Please be advised that as per protocol, today's visit has been an evaluation only. We have not taken over the patient's controlled substance management.  Ordered Lab-work, Procedure(s), Referral(s), & Consult(s): Orders Placed This Encounter  Procedures  . MR CERVICAL SPINE WO CONTRAST  . MR SHOULDER RIGHT WO CONTRAST  . Compliance Drug Analysis, Ur  . Ambulatory referral to Psychology   Pharmacological management options:  Opioid Analgesics: The patient was informed that there is no guarantee that he would be a candidate for opioid analgesics. The decision will be made following CDC guidelines. This decision will be based on the results of diagnostic studies, as well as Mr. DBallengeerisk profile. Consider HC or Oxycodone 5 mg #45-60/mo  Membrane stabilizer: To be determined at a later time  Muscle relaxant: To be determined at a later time  NSAID: To be determined at a later time  Other analgesic(s): To be determined at a later time   Interventional management options: Mr. DDefinawas informed that there is no guarantee that he would be a candidate for interventional therapies. The decision will be based on the results of diagnostic studies, as well as Mr. DSoterrisk profile.  Procedure(s) under consideration:  Cervical ESI Cervical facet blocks Cervical facet radiofrequency  ablation Shoulder joint injection   Provider-requested follow-up: Return in about 4 weeks (around 01/04/2018) for After Psychological evaluation, After Imaging.  Future Appointments  Date  Time Provider [place]  01/04/2018 10:45 AM Gillis Santa, MD [facility] None    Primary Care Physician: System, Pcp Not In Location: [facility] Note by: Gillis Santa, M.D, Date: 12/07/2017; Time: 2:31 PM  Patient Instructions  You have been instructed to get MRI of shoulder and cervical area.  See medical psychologist and then call for a follow up appointment after these things are completed,

## 2017-12-12 LAB — COMPLIANCE DRUG ANALYSIS, UR

## 2017-12-13 ENCOUNTER — Telehealth: Payer: Self-pay | Admitting: *Deleted

## 2017-12-28 ENCOUNTER — Ambulatory Visit
Admission: RE | Admit: 2017-12-28 | Discharge: 2017-12-28 | Disposition: A | Discharge status: Home / Self Care | Payer: Medicaid Other | Source: Ambulatory Visit | Attending: Student in an Organized Health Care Education/Training Program | Admitting: Student in an Organized Health Care Education/Training Program

## 2017-12-28 DIAGNOSIS — M75101 Unspecified rotator cuff tear or rupture of right shoulder, not specified as traumatic: Secondary | ICD-10-CM | POA: Insufficient documentation

## 2017-12-28 DIAGNOSIS — M5030 Other cervical disc degeneration, unspecified cervical region: Secondary | ICD-10-CM

## 2017-12-28 DIAGNOSIS — M5412 Radiculopathy, cervical region: Secondary | ICD-10-CM

## 2017-12-28 DIAGNOSIS — Z9889 Other specified postprocedural states: Secondary | ICD-10-CM

## 2017-12-28 DIAGNOSIS — M6289 Other specified disorders of muscle: Secondary | ICD-10-CM | POA: Insufficient documentation

## 2018-01-04 ENCOUNTER — Other Ambulatory Visit: Payer: Self-pay

## 2018-01-04 ENCOUNTER — Ambulatory Visit
Payer: Medicaid Other | Attending: Student in an Organized Health Care Education/Training Program | Admitting: Student in an Organized Health Care Education/Training Program

## 2018-01-04 ENCOUNTER — Encounter: Payer: Self-pay | Admitting: Student in an Organized Health Care Education/Training Program

## 2018-01-04 VITALS — BP 136/89 | HR 51 | Temp 98.1°F | Resp 18 | Ht 71.0 in | Wt 180.0 lb

## 2018-01-04 DIAGNOSIS — E039 Hypothyroidism, unspecified: Secondary | ICD-10-CM | POA: Insufficient documentation

## 2018-01-04 DIAGNOSIS — Z87891 Personal history of nicotine dependence: Secondary | ICD-10-CM | POA: Insufficient documentation

## 2018-01-04 DIAGNOSIS — M25511 Pain in right shoulder: Secondary | ICD-10-CM | POA: Diagnosis not present

## 2018-01-04 DIAGNOSIS — M542 Cervicalgia: Secondary | ICD-10-CM

## 2018-01-04 DIAGNOSIS — Z9889 Other specified postprocedural states: Secondary | ICD-10-CM | POA: Diagnosis not present

## 2018-01-04 DIAGNOSIS — M722 Plantar fascial fibromatosis: Secondary | ICD-10-CM

## 2018-01-04 DIAGNOSIS — G894 Chronic pain syndrome: Secondary | ICD-10-CM | POA: Diagnosis present

## 2018-01-04 DIAGNOSIS — M4722 Other spondylosis with radiculopathy, cervical region: Secondary | ICD-10-CM

## 2018-01-04 DIAGNOSIS — M47812 Spondylosis without myelopathy or radiculopathy, cervical region: Secondary | ICD-10-CM

## 2018-01-04 DIAGNOSIS — F329 Major depressive disorder, single episode, unspecified: Secondary | ICD-10-CM | POA: Diagnosis not present

## 2018-01-04 DIAGNOSIS — M5030 Other cervical disc degeneration, unspecified cervical region: Secondary | ICD-10-CM

## 2018-01-04 DIAGNOSIS — F419 Anxiety disorder, unspecified: Secondary | ICD-10-CM | POA: Diagnosis not present

## 2018-01-04 DIAGNOSIS — M5010 Cervical disc disorder with radiculopathy, unspecified cervical region: Secondary | ICD-10-CM | POA: Insufficient documentation

## 2018-01-04 DIAGNOSIS — B1920 Unspecified viral hepatitis C without hepatic coma: Secondary | ICD-10-CM | POA: Diagnosis not present

## 2018-01-04 DIAGNOSIS — G40909 Epilepsy, unspecified, not intractable, without status epilepticus: Secondary | ICD-10-CM | POA: Diagnosis not present

## 2018-01-04 DIAGNOSIS — Z79899 Other long term (current) drug therapy: Secondary | ICD-10-CM | POA: Insufficient documentation

## 2018-01-04 DIAGNOSIS — M75101 Unspecified rotator cuff tear or rupture of right shoulder, not specified as traumatic: Secondary | ICD-10-CM

## 2018-01-04 NOTE — Patient Instructions (Signed)
____________________________________________________________________________________________  General Risks and Possible Complications  Patient Responsibilities: It is important that you read this as it is part of your informed consent. It is our duty to inform you of the risks and possible complications associated with treatments offered to you. It is your responsibility as a patient to read this and to ask questions about anything that is not clear or that you believe was not covered in this document.  Patient's Rights: You have the right to refuse treatment. You also have the right to change your mind, even after initially having agreed to have the treatment done. However, under this last option, if you wait until the last second to change your mind, you may be charged for the materials used up to that point.  Introduction: Medicine is not an exact science. Everything in Medicine, including the lack of treatment(s), carries the potential for danger, harm, or loss (which is by definition: Risk). In Medicine, a complication is a secondary problem, condition, or disease that can aggravate an already existing one. All treatments carry the risk of possible complications. The fact that a side effects or complications occurs, does not imply that the treatment was conducted incorrectly. It must be clearly understood that these can happen even when everything is done following the highest safety standards.  No treatment: You can choose not to proceed with the proposed treatment alternative. The "PRO(s)" would include: avoiding the risk of complications associated with the therapy. The "CON(s)" would include: not getting any of the treatment benefits. These benefits fall under one of three categories: diagnostic; therapeutic; and/or palliative. Diagnostic benefits include: getting information which can ultimately lead to improvement of the disease or symptom(s). Therapeutic benefits are those associated with the  successful treatment of the disease. Finally, palliative benefits are those related to the decrease of the primary symptoms, without necessarily curing the condition (example: decreasing the pain from a flare-up of a chronic condition, such as incurable terminal cancer).  General Risks and Complications: These are associated to most interventional treatments. They can occur alone, or in combination. They fall under one of the following six (6) categories: no benefit or worsening of symptoms; bleeding; infection; nerve damage; allergic reactions; and/or death. 1. No benefits or worsening of symptoms: In Medicine there are no guarantees, only probabilities. No healthcare provider can ever guarantee that a medical treatment will work, they can only state the probability that it may. Furthermore, there is always the possibility that the condition may worsen, either directly, or indirectly, as a consequence of the treatment. 2. Bleeding: This is more common if the patient is taking a blood thinner, either prescription or over the counter (example: Goody Powders, Fish oil, Aspirin, Garlic, etc.), or if suffering a condition associated with impaired coagulation (example: Hemophilia, cirrhosis of the liver, low platelet counts, etc.). However, even if you do not have one on these, it can still happen. If you have any of these conditions, or take one of these drugs, make sure to notify your treating physician. 3. Infection: This is more common in patients with a compromised immune system, either due to disease (example: diabetes, cancer, human immunodeficiency virus [HIV], etc.), or due to medications or treatments (example: therapies used to treat cancer and rheumatological diseases). However, even if you do not have one on these, it can still happen. If you have any of these conditions, or take one of these drugs, make sure to notify your treating physician. 4. Nerve Damage: This is more common when the   treatment is  an invasive one, but it can also happen with the use of medications, such as those used in the treatment of cancer. The damage can occur to small secondary nerves, or to large primary ones, such as those in the spinal cord and brain. This damage may be temporary or permanent and it may lead to impairments that can range from temporary numbness to permanent paralysis and/or brain death. 5. Allergic Reactions: Any time a substance or material comes in contact with our body, there is the possibility of an allergic reaction. These can range from a mild skin rash (contact dermatitis) to a severe systemic reaction (anaphylactic reaction), which can result in death. 6. Death: In general, any medical intervention can result in death, most of the time due to an unforeseen complication. ____________________________________________________________________________________________  Facet Blocks Patient Information  Description: The facets are joints in the spine between the vertebrae.  Like any joints in the body, facets can become irritated and painful.  Arthritis can also effect the facets.  By injecting steroids and local anesthetic in and around these joints, we can temporarily block the nerve supply to them.  Steroids act directly on irritated nerves and tissues to reduce selling and inflammation which often leads to decreased pain.  Facet blocks may be done anywhere along the spine from the neck to the low back depending upon the location of your pain.   After numbing the skin with local anesthetic (like Novocaine), a small needle is passed onto the facet joints under x-ray guidance.  You may experience a sensation of pressure while this is being done.  The entire block usually lasts about 15-25 minutes.   Conditions which may be treated by facet blocks:   Low back/buttock pain  Neck/shoulder pain  Certain types of headaches  Preparation for the injection:  1. Do not eat any solid food or dairy  products within 8 hours of your appointment. 2. You may drink clear liquid up to 3 hours before appointment.  Clear liquids include water, black coffee, juice or soda.  No milk or cream please. 3. You may take your regular medication, including pain medications, with a sip of water before your appointment.  Diabetics should hold regular insulin (if taken separately) and take 1/2 normal NPH dose the morning of the procedure.  Carry some sugar containing items with you to your appointment. 4. A driver must accompany you and be prepared to drive you home after your procedure. 5. Bring all your current medications with you. 6. An IV may be inserted and sedation may be given at the discretion of the physician. 7. A blood pressure cuff, EKG and other monitors will often be applied during the procedure.  Some patients may need to have extra oxygen administered for a short period. 8. You will be asked to provide medical information, including your allergies and medications, prior to the procedure.  We must know immediately if you are taking blood thinners (like Coumadin/Warfarin) or if you are allergic to IV iodine contrast (dye).  We must know if you could possible be pregnant.  Possible side-effects:   Bleeding from needle site  Infection (rare, may require surgery)  Nerve injury (rare)  Numbness & tingling (temporary)  Difficulty urinating (rare, temporary)  Spinal headache (a headache worse with upright posture)  Light-headedness (temporary)  Pain at injection site (serveral days)  Decreased blood pressure (rare, temporary)  Weakness in arm/leg (temporary)  Pressure sensation in back/neck (temporary)   Call if you   experience:   Fever/chills associated with headache or increased back/neck pain  Headache worsened by an upright position  New onset, weakness or numbness of an extremity below the injection site  Hives or difficulty breathing (go to the emergency  room)  Inflammation or drainage at the injection site(s)  Severe back/neck pain greater than usual  New symptoms which are concerning to you  Please note:  Although the local anesthetic injected can often make your back or neck feel good for several hours after the injection, the pain will likely return. It takes 3-7 days for steroids to work.  You may not notice any pain relief for at least one week.  If effective, we will often do a series of 2-3 injections spaced 3-6 weeks apart to maximally decrease your pain.  After the initial series, you may be a candidate for a more permanent nerve block of the facets.  If you have any questions, please call [phone] [facility] 

## 2018-01-04 NOTE — Progress Notes (Signed)
Patient's Name: Hayden Hall  MRN: [id number]  Referring Provider: No ref. provider found  DOB: Jun 23, 1956  PCP: Theotis Burrow, MD  DOS: 01/04/2018  Note by: Gillis Santa, MD  Service setting: Ambulatory outpatient  Specialty: Interventional Pain Management  Location: [facility] (AMB) Pain Management Facility    Patient type: Established   Primary Reason(s) for Visit: Encounter for evaluation before starting new chronic pain management plan of care (Level of risk: moderate) CC: Foot Pain and Neck Pain  HPI  Hayden Hall is a 62 y.o. year old, male patient, who comes today for a follow-up evaluation to review the test results and decide on a treatment plan. He has BPH with obstruction/lower urinary tract symptoms; Cervicalgia; History of rotator cuff surgery; Cervical radiculopathy; DDD (degenerative disc disease), cervical; and Chronic pain syndrome on their problem list. His primarily concern today is the Foot Pain and Neck Pain  Pain Assessment: Location: Right Foot Radiating: radiates up ankle Onset: More than a month ago Duration: Chronic pain Quality: Numbness, Burning Severity: 8 /10 (self-reported pain score)  Note: Reported level is compatible with observation.                         When using our objective Pain Scale, levels between 6 and 10/10 are said to belong in an emergency room, as it progressively worsens from a 6/10, described as severely limiting, requiring emergency care not usually available at an outpatient pain management facility. At a 6/10 level, communication becomes difficult and requires great effort. Assistance to reach the emergency department may be required. Facial flushing and profuse sweating along with potentially dangerous increases in heart rate and blood pressure will be evident. Effect on ADL:   Timing: Constant Modifying factors: rest  Hayden Hall comes in today for a follow-up visit after his initial evaluation on 12/13/2017. Today we went over  the results of his tests. These were explained in "Layman's terms". During today's appointment we went over my diagnostic impression, as well as the proposed treatment plan.  Patient returns today for follow-up after right shoulder MRI, cervical MRI.  Patient's urine drug screen from last time showed low urine creatinine.  Will repeat today.  Have instructed patient that I will not be able to prescribe him opioids until we have a adequate sample for UDS.  In considering the treatment plan options, Hayden Hall was reminded that I no longer take patients for medication management only. I asked him to let me know if he had no intention of taking advantage of the interventional therapies, so that we could make arrangements to provide this space to someone interested. I also made it clear that undergoing interventional therapies for the purpose of getting pain medications is very inappropriate on the part of a patient, and it will not be tolerated in this practice. This type of behavior would suggest true addiction and therefore it requires referral to an addiction specialist.   Further details on both, my assessment(s), as well as the proposed treatment plan, please see below.  Controlled Substance Pharmacotherapy Assessment REMS (Risk Evaluation and Mitigation Strategy)  Analgesic: 0 MME/day: 0 mg/day. Pill Count: None expected due to no prior prescriptions written by our practice. No notes on file  Monitoring: [place] PMP: Online review of the past 33-monthperiod previously conducted. Not applicable at this point since we have not taken over the patient's medication management yet. List of other Serum/Urine Drug Screening Test(s):  No results found  for: AMPHSCRSER, BARBSCRSER, BENZOSCRSER, COCAINSCRSER, COCAINSCRNUR, PCPSCRSER, THCSCRSER, THCU, CANNABQUANT, OPIATESCRSER, OXYSCRSER, [place], [facility] List of all UDS test(s) done:  Lab Results  Component Value Date   SUMMARY FINAL 12/07/2017   Last  UDS on record: Summary  Date Value Ref Range Status  12/07/2017 FINAL  Final    Comment:    ==================================================================== TOXASSURE COMP DRUG ANALYSIS,UR ==================================================================== Specimen Alert Note:  Urinary creatinine is low; ability to detect some drugs may be compromised.  Interpret results with caution. ==================================================================== Test                             Result       Flag       Units Drug Absent but Declared for Prescription Verification   Tramadol                       Not Detected UNEXPECTED ng/mg creat   Diphenhydramine                Not Detected UNEXPECTED   Promethazine                   Not Detected UNEXPECTED ==================================================================== Test                      Result    Flag   Units      Ref Range   Creatinine              17        L      mg/dL      >=20 ==================================================================== Declared Medications:  The flagging and interpretation on this report are based on the  following declared medications.  Unexpected results may arise from  inaccuracies in the declared medications.  **Note: The testing scope of this panel includes these medications:  Diphenhydramine  Promethazine  Tramadol (Ultram)  **Note: The testing scope of this panel does not include following  reported medications:  Cyanocobalamin  Hydrocortisone  Levothyroxine  Multivitamin  Nystatin  Omega-3 Fatty Acids  Omeprazole  Sulfamethoxazole (Bactrim)  Trimethoprim (Bactrim)  Valacyclovir (Valtrex)  Vitamin C ==================================================================== For clinical consultation, please call [phone]. ====================================================================    UDS interpretation: Unexpected findings: Low urine creatinine, will repeat           Medication Assessment Form: Not applicable. No opioids. Treatment compliance: Not applicable repeat UDS Risk Assessment Profile: Aberrant behavior: See initial evaluations. None observed or detected today Comorbid factors increasing risk of overdose: See initial evaluation. No additional risks detected today Medical Psychology Evaluation: Moderate Risk  ORT Scoring interpretation table:  Score <3 = Low Risk for SUD  Score between 4-7 = Moderate Risk for SUD  Score >8 = High Risk for Opioid Abuse   Risk Mitigation Strategies:  Patient opioid safety counseling: Not applicable. Patient-Prescriber Agreement (PPA): No agreement signed.  Controlled substance notification to other providers: Not applicable  Pharmacologic Plan: Will need repeat UDS with appropriate urine creatinine and pending results from that can consider chronic opioid therapy.             Laboratory Chemistry  Inflammation Markers (CRP: Acute Phase) (ESR: Chronic Phase) No results found for: CRP, ESRSEDRATE, LATICACIDVEN                       Rheumatology Markers No results found for: RF, ANA, LABURIC, URICUR, LYMEIGGIGMAB, LYMEABIGMQN  Renal Function Markers Lab Results  Component Value Date   BUN 26 (H) 05/17/2017   CREATININE 0.86 05/17/2017   GFRAA >60 05/17/2017   GFRNONAA >60 05/17/2017                              Hepatic Function Markers Lab Results  Component Value Date   AST 30 05/17/2017   ALT 18 05/17/2017   ALBUMIN 4.5 05/17/2017   ALKPHOS 39 05/17/2017   LIPASE 107 (H) 05/17/2017                        Electrolytes Lab Results  Component Value Date   NA 138 05/17/2017   K 4.3 05/17/2017   CL 105 05/17/2017   CALCIUM 9.6 05/17/2017                        Neuropathy Markers No results found for: VITAMINB12, FOLATE, HGBA1C, HIV                      Bone Pathology Markers No results found for: VD25OH, VD125OH2TOT, OI7867EH2, CN4709GG8, 25OHVITD1, 25OHVITD2,  25OHVITD3, TESTOFREE, TESTOSTERONE                       Coagulation Parameters Lab Results  Component Value Date   PLT 275 05/17/2017                        Cardiovascular Markers Lab Results  Component Value Date   TROPONINI <0.03 12/03/2016   HGB 13.7 05/17/2017   HCT 40.7 05/17/2017                         CA Markers No results found for: CEA, CA125, LABCA2                      Note: Lab results reviewed.  Recent Diagnostic Imaging Review  Cervical Imaging: Cervical MR wo contrast:  Results for orders placed during the hospital encounter of 12/28/17  MR CERVICAL SPINE WO CONTRAST   Narrative CLINICAL DATA:  Neck pain since the 1970s, worse over the past 15 years. Pain radiates down the right shoulder  EXAM: MRI CERVICAL SPINE WITHOUT CONTRAST  TECHNIQUE: Multiplanar, multisequence MR imaging of the cervical spine was performed. No intravenous contrast was administered.  COMPARISON:  11/09/2016  FINDINGS: Alignment: Slight C3-4 and C7-T1 anterolisthesis  Vertebrae: No fracture, evidence of discitis, or bone lesion.  Cord: Normal signal and morphology.  Posterior Fossa, vertebral arteries, paraspinal tissues: Negative.  Disc levels:  C2-3: Mild facet spurring.  No impingement  C3-4: Disc narrowing with bulky rightward endplate and uncovertebral spurring. Asymmetric rightward degenerative facet spurring. High-grade right foraminal stenosis. Patent canal and left foramen  C4-5: Advanced degenerative disc narrowing with posterior disc osteophyte complex effacing the ventral subarachnoid space and contacting the ventral cord. Asymmetric rightward facet spurring. Left more than right uncovertebral spurring. Left foraminal impingement is high-grade. Patent right foramen  C5-6: Advanced degenerative disc narrowing with posterior disc osteophyte complex effacing the ventral subarachnoid space. Biforaminal impingement from disc height loss and  uncovertebral spurring  C6-7: Advanced degenerative disc narrowing with right preferential disc bulging and endplate spurring. Right uncovertebral ridging. Moderate right foraminal narrowing. Patent left foramen. Ventral subarachnoid space effacement without cord compression  C7-T1:Facet  arthropathy with slight anterolisthesis. Tiny central disc protrusion without neural contact.  IMPRESSION: 1. Advanced disc degeneration with right-sided C3-4/C4-5 facet arthropathy. No noted progression since 11/09/2016. 2. Right foraminal impingement at C3-4, C5-6, and to a lesser extent at C6-7. 3. Left foraminal impingement at C4-5 and C5-6. 4. Mild, noncompressive spinal stenosis at C4-5 and C5-6.   Electronically Signed   By: Monte Fantasia M.D.   On: 12/28/2017 09:24    Results for orders placed during the hospital encounter of 12/28/17  MR SHOULDER RIGHT WO CONTRAST   Narrative CLINICAL DATA:  Right shoulder pain for 2-3 years. Prior rotator cuff surgery 2016.  EXAM: MRI OF THE RIGHT SHOULDER WITHOUT CONTRAST  TECHNIQUE: Multiplanar, multisequence MR imaging of the shoulder was performed. No intravenous contrast was administered.  COMPARISON:  None.  FINDINGS: Rotator cuff: Large full-thickness retracted supraspinatus tendon tear. Maximum retraction is 3.5 cm. The infraspinatus and subscapularis tendons are intact.  Muscles: Retraction and minimal fatty atrophy of the supraspinatus muscle.  Biceps long head: Significant tendinopathy involving the intra-articular portion.  Acromioclavicular Joint: Moderate degenerative changes type 1-2 acromion. No lateral downsloping or undersurface spurring.  Glenohumeral Joint: Moderate degenerative changes. Small joint effusion. There is thickening of the capsular structures in the axillary recess which can be seen with adhesive capsulitis or synovitis.  Labrum:  Labral degenerative changes without obvious tear.  Bones:  No  acute bony findings.  Other: Expected fluid in the subacromial/subdeltoid bursa.  IMPRESSION: 1. Large full-thickness retracted supraspinatus tendon tear. Retraction and minimal fatty atrophy of the supraspinatus muscle. The infraspinatus and subscapularis tendons are intact. 2. Significant tendinopathy involving the intra-articular portion of the long head biceps tendon. 3. No findings for bony impingement. 4. Labral degenerative changes without obvious tear. 5. Moderate glenohumeral joint degenerative changes.   Electronically Signed   By: Marijo Sanes M.D.   On: 12/28/2017 09:22    Complexity Note: Imaging results reviewed. Results shared with Mr. Chagnon, using Layman's terms.                         Meds   Current Outpatient Medications:  .  Ascorbic Acid (VITAMIN C PO), Take by mouth., Disp: , Rfl:  .  Cyanocobalamin (VITAMIN B 12 PO), Take by mouth., Disp: , Rfl:  .  Diphenhyd-Hydrocort-Nystatin (FIRST-DUKES MOUTHWASH MT), Reported on 10/19/2015, Disp: , Rfl: 6 .  DOCOSAHEXAENOIC ACID PO, Take by mouth., Disp: , Rfl:  .  levothyroxine (SYNTHROID, LEVOTHROID) 25 MCG tablet, Take 25 mcg by mouth daily., Disp: , Rfl: 4 .  Multiple Vitamin (MULTI-VITAMINS) TABS, Take by mouth., Disp: , Rfl:  .  Omeprazole (RA OMEPRAZOLE) 20 MG TBEC, Take 20 mg by mouth. Reported on 10/19/2015, Disp: , Rfl:  .  tiZANidine (ZANAFLEX) 4 MG tablet, Take 4 mg by mouth every 6 (six) hours as needed for muscle spasms., Disp: , Rfl:  .  valACYclovir (VALTREX) 500 MG tablet, Take 500 mg by mouth., Disp: , Rfl:  .  promethazine (PHENERGAN) 25 MG tablet, Take 25 mg by mouth. Reported on 10/19/2015, Disp: , Rfl:  .  sulfamethoxazole-trimethoprim (BACTRIM DS,SEPTRA DS) 800-160 MG tablet, Take 1 tablet by mouth 2 (two) times daily. (Patient not taking: Reported on 11/08/2016), Disp: 20 tablet, Rfl: 0 .  traMADol (ULTRAM) 50 MG tablet, Take by mouth every 6 (six) hours as needed. for pain, Disp: , Rfl: 3  ROS   Constitutional: Denies any fever or chills Gastrointestinal: No reported hemesis,  hematochezia, vomiting, or acute GI distress Musculoskeletal: Denies any acute onset joint swelling, redness, loss of ROM, or weakness Neurological: No reported episodes of acute onset apraxia, aphasia, dysarthria, agnosia, amnesia, paralysis, loss of coordination, or loss of consciousness  Allergies  Mr. Fye is allergic to vesicare [solifenacin].  [place]  Drug: Mr. Polhamus  reports that he does not use drugs. Alcohol:  reports that he does not drink alcohol. Tobacco:  reports that he has quit smoking. He has never used smokeless tobacco. Medical:  has a past medical history of Anxiety, Arthritis, BPH (benign prostatic hyperplasia), Bronchitis, Chronic constipation, Chronic pain, Depression, Hepatitis C, Herpes, History of pancreatitis, History of seizure disorder, Hypothyroidism, Memory impairment, Myoclonic jerking, Neck injury, Nocturia, SOB (shortness of breath), Trigger finger, and Urinary frequency. Surgical: Mr. Drewes  has a past surgical history that includes Trigger finger release; Rotator cuff repair (Left); and Rotator cuff repair (Right). Family: family history includes Bladder Cancer in his mother; Breast cancer in his mother; Heart disease in his unknown relative; Kidney disease in his cousin and mother; Prostate cancer in his father.  Constitutional Exam  General appearance: Well nourished, well developed, and well hydrated. In no apparent acute distress Vitals:   01/04/18 1112  BP: 136/89  Pulse: (!) 51  Resp: 18  Temp: 98.1 F (36.7 C)  SpO2: 100%  Weight: 180 lb (81.6 kg)  Height: '5\' 11"'$  (1.803 m)   BMI Assessment: Estimated body mass index is 25.1 kg/m as calculated from the following:   Height as of this encounter: '5\' 11"'$  (1.803 m).   Weight as of this encounter: 180 lb (81.6 kg).  BMI interpretation table: BMI level Category Range association with higher incidence of chronic  pain  <18 kg/m2 Underweight   18.5-24.9 kg/m2 Ideal body weight   25-29.9 kg/m2 Overweight Increased incidence by 20%  30-34.9 kg/m2 Obese (Class I) Increased incidence by 68%  35-39.9 kg/m2 Severe obesity (Class II) Increased incidence by 136%  >40 kg/m2 Extreme obesity (Class III) Increased incidence by 254%   BMI Readings from Last 4 Encounters:  01/04/18 25.10 kg/m  12/07/17 24.97 kg/m  05/17/17 23.60 kg/m  12/03/16 23.71 kg/m   Wt Readings from Last 4 Encounters:  01/04/18 180 lb (81.6 kg)  12/07/17 179 lb (81.2 kg)  05/17/17 174 lb (78.9 kg)  12/03/16 170 lb (77.1 kg)  Psych/Mental status: Alert, oriented x 3 (person, place, & time)       Eyes: PERLA Respiratory: No evidence of acute respiratory distress   Cervical Spine Area Exam  Skin & Axial Inspection: No masses, redness, edema, swelling, or associated skin lesions Alignment: Symmetrical Functional ROM: Diminished ROM, bilaterally Stability: No instability detected Muscle Tone/Strength: Functionally intact. No obvious neuro-muscular anomalies detected. Sensory (Neurological): Dermatomal pain pattern Palpation: Complains of area being tender to palpation Positive provocative maneuver for for cervical facet disease         Upper Extremity (UE) Exam    Side: Right upper extremity  Side: Left upper extremity   Skin & Extremity Inspection: Evidence of prior arthroplastic surgery  Skin & Extremity Inspection: Evidence of prior arthroplastic surgery   Functional ROM: Decreased ROM for shoulder  Functional ROM: Decreased ROM for shoulder   Muscle Tone/Strength: Functionally intact. No obvious neuro-muscular anomalies detected.  Muscle Tone/Strength: Functionally intact. No obvious neuro-muscular anomalies detected.   Sensory (Neurological): Arthropathic arthralgia          Sensory (Neurological): Arthropathic arthralgia  Palpation: No palpable anomalies              Palpation: No palpable anomalies                Specialized Test(s): Deferred         Specialized Test(s): Deferred           Thoracic Spine Area Exam  Skin & Axial Inspection: No masses, redness, or swelling Alignment: Symmetrical Functional ROM: Unrestricted ROM Stability: No instability detected Muscle Tone/Strength: Functionally intact. No obvious neuro-muscular anomalies detected. Sensory (Neurological): Unimpaired Muscle strength & Tone: No palpable anomalies  Lumbar Spine Area Exam  Skin & Axial Inspection: No masses, redness, or swelling Alignment: Symmetrical Functional ROM: Unrestricted ROM      Stability: No instability detected Muscle Tone/Strength: Functionally intact. No obvious neuro-muscular anomalies detected. Sensory (Neurological): Unimpaired Palpation: No palpable anomalies       Provocative Tests: Lumbar Hyperextension and rotation test: Positive bilaterally for facet joint pain. Lumbar Lateral bending test: evaluation deferred today       Patrick's Maneuver: evaluation deferred today                    Gait & Posture Assessment  Ambulation: Unassisted Gait: Relatively normal for age and body habitus Posture: WNL   Lower Extremity Exam    Side: Right lower extremity  Side: Left lower extremity  Skin & Extremity Inspection: Skin color, temperature, and hair growth are WNL. No peripheral edema or cyanosis. No masses, redness, swelling, asymmetry, or associated skin lesions. No contractures.  Skin & Extremity Inspection: Skin color, temperature, and hair growth are WNL. No peripheral edema or cyanosis. No masses, redness, swelling, asymmetry, or associated skin lesions. No contractures.  Functional ROM: Unrestricted ROM          Functional ROM: Unrestricted ROM          Muscle Tone/Strength: Functionally intact. No obvious neuro-muscular anomalies detected.  Muscle Tone/Strength: Functionally intact. No obvious neuro-muscular anomalies detected.  Sensory (Neurological): Unimpaired  Sensory  (Neurological): Unimpaired  Palpation: No palpable anomalies  Palpation: No palpable anomalies     Assessment & Plan  Primary Diagnosis & Pertinent Problem List: The primary encounter diagnosis was Cervical facet joint syndrome - R C3,4,5,6,7. Diagnoses of Osteoarthritis of spine with radiculopathy, cervical region, Cervicalgia, History of rotator cuff surgery, Tear of right supraspinatus tendon, DDD (degenerative disc disease), cervical, Chronic pain syndrome, and Plantar fasciitis were also pertinent to this visit.  Visit Diagnosis: 1. 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 Osteoarthritis of spine with radiculopathy, cervical region   3. Cervicalgia   4. History of rotator cuff surgery   5. Tear of right supraspinatus tendon   6. DDD (degenerative disc disease), cervical   7. Chronic pain syndrome   8. Plantar fasciitis    General Recommendations: The pain condition that the patient suffers from is best treated with a multidisciplinary approach that involves an increase in physical activity to prevent de-conditioning and worsening of the pain cycle, as well as psychological counseling (formal and/or informal) to address the co-morbid psychological affects of pain. Treatment will often involve judicious use of pain medications and interventional procedures to decrease the pain, allowing the patient to participate in the physical activity that will ultimately produce long-lasting pain reductions. The goal of the multidisciplinary approach is to return the patient to a higher level of overall function and to restore their ability to perform activities of daily living.  61 year old  male who presents with a chief complaint of neck pain, right heel pain, right shoulder pain.  Patient has a history of 2 bilateral rotator cuff surgeries.  He states that he is continuing to have severe neck pain that radiates down into his right shoulder and right arm. Patient also endorses issues with  fine motor control and has had problems dropping objects in his left hand. He has tried various medications in the past including gabapentin which makes him sedated even at the lowest dose, meloxicam and other NSAIDs which upset his stomach, Tylenol which does nothing.   At the patient's last visit, cervical MRI and right shoulder MRI were obtained.  Patient's cervical MRI is concerning for advanced cervical degenerative disc disease with right-sided facet arthropathy most pronounced at C3, 4, 5, 6.  Patient also has right neuroforaminal impingement at C3/4, C5/6 along with left foraminal impingement at C4/5 and C5/6.  Patient has mild spinal canal stenosis at C4/5 and C5/6.  Regards to his right shoulder MRI, patient has a large full-thickness retracted supraspinatus tendon tear with significant tendinopathy and moderate glenohumeral joint degenerative changes.  Today we discussed therapeutic options for his neck and shoulder pain which could be secondary to cervical facet arthropathy and cervical spondylosis.  We discussed right C3, 4, 5, 6, 7 cervical facet medial branch nerve block with possible radiofrequency ablation thereafter if diagnostic block is effective.  In regards to his large full-thickness supraspinatus tendon tear, patient requested to see his previous orthopedic surgeon which I think is reasonable.  Patient's shoulder pain could be secondary to cervical facet referral pattern but given that patient does have a significant full-thickness supraspinatus tendon tear, it is reasonable for him to follow-up with his previous orthopedic surgeon to discuss whether surgical revision is warranted.  In regards to medication management, the patient's last urine drug screen showed a low urine creatinine.  I will repeat his urine drug screen today.  Pending UDS results, we can consider low-dose chronic opioid therapy.  Patient is also complaining of isolated burning right ankle and foot pain which could  be secondary to plantar fasciitis.  I will refer him to podiatry for further evaluation of his plantar fasciitis.  Plan: -Repeat UDS -Right C3, 4, 5, 6, 7 cervical facet medial branch nerve blocks (diagnostic) for cervical spondylosis, facet arthropathy -Referral to orthopedic surgery given right full-thickness supraspinatus tendon tear with significant tendinopathy and persistent right shoulder pain -Referral to podiatry for evaluation and management of right plantar fasciitis.  Lab-work, procedure(s), and/or referral(s): Orders Placed This Encounter  Procedures  . CERVICAL FACET (MEDIAL BRANCH NERVE BLOCK)   . Compliance Drug Analysis, Ur  . Ambulatory referral to Orthopedic Surgery  . Ambulatory referral to Podiatry    Pharmacological management options:  Opioid Analgesics: Can consider oxycodone 5 mg, quantity 45-68-month pending UDS Membrane stabilizer: Patient is tried gabapentin, Lyrica, Cymbalta which were not effective and resulted in side effects of cognitive dysfunction Muscle relaxant: We have discussed the possibility of a trial NSAID: We have discussed the possibility of a trial Other analgesic(s): To be determined at a later time    Provider-requested follow-up: Return in about 3 weeks (around 01/22/2018) for Procedure, Medication Management. Time Note: Greater than 50% of the 25 minute(s) of face-to-face time spent with Mr. DReuter was spent in counseling/coordination of care regarding: Mr. DMilnesprimary cause of pain, the results of his recent test(s), the significance of each one oth the test(s) anomalies and it's corresponding characteristic pain pattern(s),  the treatment plan, treatment alternatives and the risks and possible complications of proposed treatment. Future Appointments  Date Time Provider [place]  01/22/2018  9:00 AM Gillis Santa, MD [facility] None    Primary Care Physician: Theotis Burrow, MD Location: [facility] Note by: Gillis Santa, M.D Date: 01/04/2018; Time: 1:50 PM  Patient Instructions  ____________________________________________________________________________________________  General Risks and Possible Complications  Patient Responsibilities: It is important that you read this as it is part of your informed consent. It is our duty to inform you of the risks and possible complications associated with treatments offered to you. It is your responsibility as a patient to read this and to ask questions about anything that is not clear or that you believe was not covered in this document.  Patient's Rights: You have the right to refuse treatment. You also have the right to change your mind, even after initially having agreed to have the treatment done. However, under this last option, if you wait until the last second to change your mind, you may be charged for the materials used up to that point.  Introduction: Medicine is not an Chief Strategy Officer. Everything in Medicine, including the lack of treatment(s), carries the potential for danger, harm, or loss (which is by definition: Risk). In Medicine, a complication is a secondary problem, condition, or disease that can aggravate an already existing one. All treatments carry the risk of possible complications. The fact that a side effects or complications occurs, does not imply that the treatment was conducted incorrectly. It must be clearly understood that these can happen even when everything is done following the highest safety standards.  No treatment: You can choose not to proceed with the proposed treatment alternative. The "PRO(s)" would include: avoiding the risk of complications associated with the therapy. The "CON(s)" would include: not getting any of the treatment benefits. These benefits fall under one of three categories: diagnostic; therapeutic; and/or palliative. Diagnostic benefits include: getting information which can  ultimately lead to improvement of the disease or symptom(s). Therapeutic benefits are those associated with the successful treatment of the disease. Finally, palliative benefits are those related to the decrease of the primary symptoms, without necessarily curing the condition (example: decreasing the pain from a flare-up of a chronic condition, such as incurable terminal cancer).  General Risks and Complications: These are associated to most interventional treatments. They can occur alone, or in combination. They fall under one of the following six (6) categories: no benefit or worsening of symptoms; bleeding; infection; nerve damage; allergic reactions; and/or death. 1. No benefits or worsening of symptoms: In Medicine there are no guarantees, only probabilities. No healthcare provider can ever guarantee that a medical treatment will work, they can only state the probability that it may. Furthermore, there is always the possibility that the condition may worsen, either directly, or indirectly, as a consequence of the treatment. 2. Bleeding: This is more common if the patient is taking a blood thinner, either prescription or over the counter (example: Goody Powders, Fish oil, Aspirin, Garlic, etc.), or if suffering a condition associated with impaired coagulation (example: Hemophilia, cirrhosis of the liver, low platelet counts, etc.). However, even if you do not have one on these, it can still happen. If you have any of these conditions, or take one of these drugs, make sure to notify your treating physician. 3. Infection: This is more common in patients with a compromised immune system, either due to disease (example: diabetes, cancer,  human immunodeficiency virus [HIV], etc.), or due to medications or treatments (example: therapies used to treat cancer and rheumatological diseases). However, even if you do not have one on these, it can still happen. If you have any of these conditions, or take one of these  drugs, make sure to notify your treating physician. 4. Nerve Damage: This is more common when the treatment is an invasive one, but it can also happen with the use of medications, such as those used in the treatment of cancer. The damage can occur to small secondary nerves, or to large primary ones, such as those in the spinal cord and brain. This damage may be temporary or permanent and it may lead to impairments that can range from temporary numbness to permanent paralysis and/or brain death. 5. Allergic Reactions: Any time a substance or material comes in contact with our body, there is the possibility of an allergic reaction. These can range from a mild skin rash (contact dermatitis) to a severe systemic reaction (anaphylactic reaction), which can result in death. 6. Death: In general, any medical intervention can result in death, most of the time due to an unforeseen complication. ____________________________________________________________________________________________  Facet Blocks Patient Information  Description: The facets are joints in the spine between the vertebrae.  Like any joints in the body, facets can become irritated and painful.  Arthritis can also effect the facets.  By injecting steroids and local anesthetic in and around these joints, we can temporarily block the nerve supply to them.  Steroids act directly on irritated nerves and tissues to reduce selling and inflammation which often leads to decreased pain.  Facet blocks may be done anywhere along the spine from the neck to the low back depending upon the location of your pain.   After numbing the skin with local anesthetic (like Novocaine), a small needle is passed onto the facet joints under x-ray guidance.  You may experience a sensation of pressure while this is being done.  The entire block usually lasts about 15-25 minutes.   Conditions which may be treated by facet blocks:   Low back/buttock pain  Neck/shoulder  pain  Certain types of headaches  Preparation for the injection:  1. Do not eat any solid food or dairy products within 8 hours of your appointment. 2. You may drink clear liquid up to 3 hours before appointment.  Clear liquids include water, black coffee, juice or soda.  No milk or cream please. 3. You may take your regular medication, including pain medications, with a sip of water before your appointment.  Diabetics should hold regular insulin (if taken separately) and take 1/2 normal NPH dose the morning of the procedure.  Carry some sugar containing items with you to your appointment. 4. A driver must accompany you and be prepared to drive you home after your procedure. 5. Bring all your current medications with you. 6. An IV may be inserted and sedation may be given at the discretion of the physician. 7. A blood pressure cuff, EKG and other monitors will often be applied during the procedure.  Some patients may need to have extra oxygen administered for a short period. 8. You will be asked to provide medical information, including your allergies and medications, prior to the procedure.  We must know immediately if you are taking blood thinners (like Coumadin/Warfarin) or if you are allergic to IV iodine contrast (dye).  We must know if you could possible be pregnant.  Possible side-effects:   Bleeding from  needle site  Infection (rare, may require surgery)  Nerve injury (rare)  Numbness & tingling (temporary)  Difficulty urinating (rare, temporary)  Spinal headache (a headache worse with upright posture)  Light-headedness (temporary)  Pain at injection site (serveral days)  Decreased blood pressure (rare, temporary)  Weakness in arm/leg (temporary)  Pressure sensation in back/neck (temporary)   Call if you experience:   Fever/chills associated with headache or increased back/neck pain  Headache worsened by an upright position  New onset, weakness or numbness of  an extremity below the injection site  Hives or difficulty breathing (go to the emergency room)  Inflammation or drainage at the injection site(s)  Severe back/neck pain greater than usual  New symptoms which are concerning to you  Please note:  Although the local anesthetic injected can often make your back or neck feel good for several hours after the injection, the pain will likely return. It takes 3-7 days for steroids to work.  You may not notice any pain relief for at least one week.  If effective, we will often do a series of 2-3 injections spaced 3-6 weeks apart to maximally decrease your pain.  After the initial series, you may be a candidate for a more permanent nerve block of the facets.  If you have any questions, please call #[phone]) [facility]

## 2018-01-10 LAB — COMPLIANCE DRUG ANALYSIS, UR

## 2018-01-22 ENCOUNTER — Ambulatory Visit
Admission: RE | Admit: 2018-01-22 | Discharge: 2018-01-22 | Disposition: A | Discharge status: Home / Self Care | Payer: Medicaid Other | Source: Ambulatory Visit | Attending: Student in an Organized Health Care Education/Training Program | Admitting: Student in an Organized Health Care Education/Training Program

## 2018-01-22 ENCOUNTER — Encounter: Payer: Self-pay | Admitting: Student in an Organized Health Care Education/Training Program

## 2018-01-22 ENCOUNTER — Ambulatory Visit: Payer: Medicaid Other | Admitting: Student in an Organized Health Care Education/Training Program

## 2018-01-22 ENCOUNTER — Other Ambulatory Visit: Payer: Self-pay

## 2018-01-22 VITALS — BP 144/103 | HR 59 | Temp 97.6°F | Resp 11 | Ht 71.0 in | Wt 183.0 lb

## 2018-01-22 DIAGNOSIS — M4722 Other spondylosis with radiculopathy, cervical region: Secondary | ICD-10-CM | POA: Diagnosis not present

## 2018-01-22 DIAGNOSIS — M79671 Pain in right foot: Secondary | ICD-10-CM | POA: Diagnosis present

## 2018-01-22 DIAGNOSIS — M542 Cervicalgia: Secondary | ICD-10-CM | POA: Diagnosis present

## 2018-01-22 DIAGNOSIS — Z0289 Encounter for other administrative examinations: Secondary | ICD-10-CM | POA: Diagnosis not present

## 2018-01-22 DIAGNOSIS — M47812 Spondylosis without myelopathy or radiculopathy, cervical region: Secondary | ICD-10-CM

## 2018-01-22 DIAGNOSIS — G894 Chronic pain syndrome: Secondary | ICD-10-CM | POA: Diagnosis not present

## 2018-01-22 MED ORDER — LIDOCAINE HCL 1 % IJ SOLN
10.0000 mL | Freq: Once | INTRAMUSCULAR | Status: DC
Start: 2018-01-22 — End: 2018-01-22
  Filled 2018-01-22: qty 10

## 2018-01-22 MED ORDER — OXYCODONE HCL 5 MG PO TABS
5.0000 mg | ORAL_TABLET | Freq: Two times a day (BID) | ORAL | 0 refills | Status: DC | PRN
Start: 2018-01-22 — End: 2018-02-13

## 2018-01-22 MED ORDER — ROPIVACAINE HCL 2 MG/ML IJ SOLN
10.0000 mL | Freq: Once | INTRAMUSCULAR | Status: DC
  Filled 2018-01-22: qty 10

## 2018-01-22 MED ORDER — DEXAMETHASONE SODIUM PHOSPHATE 10 MG/ML IJ SOLN
10.0000 mg | Freq: Once | INTRAMUSCULAR | Status: DC
  Filled 2018-01-22: qty 1

## 2018-01-22 MED ORDER — FENTANYL CITRATE (PF) 100 MCG/2ML IJ SOLN
25.0000 ug | INTRAMUSCULAR | Status: DC | PRN
  Filled 2018-01-22: qty 2

## 2018-01-22 MED ORDER — LACTATED RINGERS IV SOLN: 1000.0000 mL | Freq: Once | INTRAVENOUS | Status: DC

## 2018-01-22 NOTE — Progress Notes (Signed)
Patient's Name: Hayden Hall  MRN: [id number]  Referring Provider: Theotis Burrow*  DOB: 1956/06/04  PCP: Theotis Burrow, MD  DOS: 01/22/2018  Note by: Gillis Santa, MD  Service setting: Ambulatory outpatient  Specialty: Interventional Pain Management  Patient type: Established  Location: [facility] (AMB) Pain Management Facility  Visit type: Interventional Procedure   Primary Reason for Visit: Interventional Pain Management Treatment. CC: Foot Pain (right); Neck Pain; and Shoulder Pain (right)  Procedure:       Anesthesia, Analgesia, Anxiolysis:  Type: Cervical Facet Medial Branch Block(s) #1  Primary Purpose: Diagnostic Region: Posterolateral cervical spine Level: C3, C4, C5, C6, & C7 Medial Branch Level(s). Injecting these levels blocks the C3-4, C4-5, C5-6, and C6-7 cervical facet joints. Laterality: Right Paraspinal  Type: Local Anesthesia Indication(s): Analgesia         Route: Infiltration ([place]/IM) IV Access: Declined Sedation: None since NPO instructions were not followed  Local Anesthetic: Lidocaine 1-2%   Indications: 1. 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 Osteoarthritis of spine with radiculopathy, cervical region   3. Chronic pain syndrome   4. Pain management contract signed    Pain Score: Pre-procedure: 10-Worst pain ever/10 Post-procedure: 3 /10  Pre-op Assessment:  Mr. Fick is a 62 y.o. (year old), male patient, seen today for interventional treatment. He  has a past surgical history that includes Trigger finger release; Rotator cuff repair (Left); and Rotator cuff repair (Right). Mr. Yett has a current medication list which includes the following prescription(s): ascorbic acid, cyanocobalamin, dicyclomine, diphenhyd-hydrocort-nystatin, levothyroxine, multi-vitamins, omeprazole, tizanidine, valacyclovir, docosahexaenoic acid, oxycodone, promethazine, sulfamethoxazole-trimethoprim, and tramadol, and the following  Facility-Administered Medications: dexamethasone, fentanyl, lactated ringers, lidocaine, and ropivacaine (pf) 2 mg/ml (0.2%). His primarily concern today is the Foot Pain (right); Neck Pain; and Shoulder Pain (right)  Initial Vital Signs:  Pulse/HCG Rate: (!) 59ECG Heart Rate: (!) 59 Temp: 97.6 F (36.4 C) Resp: 18 BP: (!) 136/91 SpO2: 100 %  BMI: Estimated body mass index is 25.52 kg/m as calculated from the following:   Height as of this encounter: 5\' 11"  (1.803 m).   Weight as of this encounter: 183 lb (83 kg).  Risk Assessment: Allergies: Reviewed. He is allergic to vesicare [solifenacin].  Allergy Precautions: None required Coagulopathies: Reviewed. None identified.  Blood-thinner therapy: None at this time Active Infection(s): Reviewed. None identified. Mr. Darko is afebrile  Site Confirmation: Mr. Czajka was asked to confirm the procedure and laterality before marking the site Procedure checklist: Completed Consent: Before the procedure and under the influence of no sedative(s), amnesic(s), or anxiolytics, the patient was informed of the treatment options, risks and possible complications. To fulfill our ethical and legal obligations, as recommended by the American Medical Association's Code of Ethics, I have informed the patient of my clinical impression; the nature and purpose of the treatment or procedure; the risks, benefits, and possible complications of the intervention; the alternatives, including doing nothing; the risk(s) and benefit(s) of the alternative treatment(s) or procedure(s); and the risk(s) and benefit(s) of doing nothing. The patient was provided information about the general risks and possible complications associated with the procedure. These may include, but are not limited to: failure to achieve desired goals, infection, bleeding, organ or nerve damage, allergic reactions, paralysis, and death. In addition, the patient was informed of those risks and  complications associated to Spine-related procedures, such as failure to decrease pain; infection (i.e.: Meningitis, epidural or intraspinal abscess); bleeding (i.e.: epidural hematoma, subarachnoid hemorrhage, or any other  type of intraspinal or peri-dural bleeding); organ or nerve damage (i.e.: Any type of peripheral nerve, nerve root, or spinal cord injury) with subsequent damage to sensory, motor, and/or autonomic systems, resulting in permanent pain, numbness, and/or weakness of one or several areas of the body; allergic reactions; (i.e.: anaphylactic reaction); and/or death. Furthermore, the patient was informed of those risks and complications associated with the medications. These include, but are not limited to: allergic reactions (i.e.: anaphylactic or anaphylactoid reaction(s)); adrenal axis suppression; blood sugar elevation that in diabetics may result in ketoacidosis or comma; water retention that in patients with history of congestive heart failure may result in shortness of breath, pulmonary edema, and decompensation with resultant heart failure; weight gain; swelling or edema; medication-induced neural toxicity; particulate matter embolism and blood vessel occlusion with resultant organ, and/or nervous system infarction; and/or aseptic necrosis of one or more joints. Finally, the patient was informed that Medicine is not an exact science; therefore, there is also the possibility of unforeseen or unpredictable risks and/or possible complications that may result in a catastrophic outcome. The patient indicated having understood very clearly. We have given the patient no guarantees and we have made no promises. Enough time was given to the patient to ask questions, all of which were answered to the patient's satisfaction. Mr. Batzel has indicated that he wanted to continue with the procedure. Attestation: I, the ordering provider, attest that I have discussed with the patient the benefits, risks,  side-effects, alternatives, likelihood of achieving goals, and potential problems during recovery for the procedure that I have provided informed consent. Date  Time: 01/22/2018  8:16 AM  Pre-Procedure Preparation:  Monitoring: As per clinic protocol. Respiration, ETCO2, SpO2, BP, heart rate and rhythm monitor placed and checked for adequate function Safety Precautions: Patient was assessed for positional comfort and pressure points before starting the procedure. Time-out: I initiated and conducted the "Time-out" before starting the procedure, as per protocol. The patient was asked to participate by confirming the accuracy of the "Time Out" information. Verification of the correct person, site, and procedure were performed and confirmed by me, the nursing staff, and the patient. "Time-out" conducted as per Joint Commission's Universal Protocol (UP.01.01.01). Time: 0920  Description of Procedure:       Position: Prone with head of the table raised to facilitate breathing. Laterality: Right Level: C3, C4, C5, C6, & C7 Medial Branch Level(s). Area Prepped: Posterior Cervico-thoracic Region Prepping solution: ChloraPrep (2% chlorhexidine gluconate and 70% isopropyl alcohol) Safety Precautions: Aspiration looking for blood return was conducted prior to all injections. At no point did we inject any substances, as a needle was being advanced. Before injecting, the patient was told to immediately notify me if he was experiencing any new onset of "ringing in the ears, or metallic taste in the mouth". No attempts were made at seeking any paresthesias. Safe injection practices and needle disposal techniques used. Medications properly checked for expiration dates. SDV (single dose vial) medications used. After the completion of the procedure, all disposable equipment used was discarded in the proper designated medical waste containers. Local Anesthesia: Protocol guidelines were followed. The patient was  positioned over the fluoroscopy table. The area was prepped in the usual manner. The time-out was completed. The target area was identified using fluoroscopy. A 12-in long, straight, sterile hemostat was used with fluoroscopic guidance to locate the targets for each level blocked. Once located, the skin was marked with an approved surgical skin marker. Once all sites were marked, the skin (  epidermis, dermis, and hypodermis), as well as deeper tissues (fat, connective tissue and muscle) were infiltrated with a small amount of a short-acting local anesthetic, loaded on a 10cc syringe with a 25G, 1.5-in  Needle. An appropriate amount of time was allowed for local anesthetics to take effect before proceeding to the next step. Local Anesthetic: Lidocaine 1.0% The unused portion of the local anesthetic was discarded in the proper designated containers. Technical explanation of process:  C3 Medial Branch Nerve Block (MBB): The target area for the C3 dorsal medial articular branch is the lateral concave waist of the articular pillar of C3. Under fluoroscopic guidance, a Quincke needle was inserted until contact was made with os over the postero-lateral aspect of the articular pillar of C3 (target area). After negative aspiration for blood, 42mL of the nerve block solution was injected without difficulty or complication. The needle was removed intact. C4 Medial Branch Nerve Block (MBB): The target area for the C4 dorsal medial articular branch is the lateral concave waist of the articular pillar of C4. Under fluoroscopic guidance, a Quincke needle was inserted until contact was made with os over the postero-lateral aspect of the articular pillar of C4 (target area). After negative aspiration for blood, 1 mL of the nerve block solution was injected without difficulty or complication. The needle was removed intact. C5 Medial Branch Nerve Block (MBB): The target area for the C5 dorsal medial articular branch is the lateral  concave waist of the articular pillar of C5. Under fluoroscopic guidance, a Quincke needle was inserted until contact was made with os over the postero-lateral aspect of the articular pillar of C5 (target area). After negative aspiration for blood, 1 mL of the nerve block solution was injected without difficulty or complication. The needle was removed intact. C6 Medial Branch Nerve Block (MBB): The target area for the C6 dorsal medial articular branch is the lateral concave waist of the articular pillar of C6. Under fluoroscopic guidance, a Quincke needle was inserted until contact was made with os over the postero-lateral aspect of the articular pillar of C6 (target area). After negative aspiration for blood, 72mL of the nerve block solution was injected without difficulty or complication. The needle was removed intact. C7 Medial Branch Nerve Block (MBB): The target for the C7 dorsal medial articular branch lies on the superior-medial tip of the C7 transverse process. Under fluoroscopic guidance, a Quincke needle was inserted until contact was made with os over the postero-lateral aspect of the articular pillar of C7 (target area). After negative aspiration for blood, 67mL of the nerve block solution was injected without difficulty or complication. The needle was removed intact. Procedural Needles: 25-gauge, 3.5-inch, Quincke needles used for all levels. Nerve block solution: 5 cc solution made of 4 cc of 0.2% ropivacaine, 1 cc of Decadron 10 mg/cc.  1 cc injected at each level as stated above.    Once the entire procedure was completed, the treated area was cleaned, making sure to leave some of the prepping solution back to take advantage of its long term bactericidal properties.  Vitals:   01/22/18 0920 01/22/18 0925 01/22/18 0930 01/22/18 0934  BP: (!) 145/100 (!) 142/102 (!) 141/102 (!) 144/103  Pulse:      Resp: 16 17 13 11   Temp:      SpO2: 96% 96% 96% 95%  Weight:      Height:        Start  Time: 0920 hrs. End Time: 0934 hrs.  Imaging Guidance (Spinal):  Type of Imaging Technique: Fluoroscopy Guidance (Spinal) Indication(s): Assistance in needle guidance and placement for procedures requiring needle placement in or near specific anatomical locations not easily accessible without such assistance. Exposure Time: Please see nurses notes. Contrast: None used. Fluoroscopic Guidance: I was personally present during the use of fluoroscopy. "Tunnel Vision Technique" used to obtain the best possible view of the target area. Parallax error corrected before commencing the procedure. "Direction-depth-direction" technique used to introduce the needle under continuous pulsed fluoroscopy. Once target was reached, antero-posterior, oblique, and lateral fluoroscopic projection used confirm needle placement in all planes. Images permanently stored in EMR. Interpretation: No contrast injected. I personally interpreted the imaging intraoperatively. Adequate needle placement confirmed in multiple planes. Permanent images saved into the patient's record.  Antibiotic Prophylaxis:   Anti-infectives (From admission, onward)   None     Indication(s): None identified  Post-operative Assessment:  Post-procedure Vital Signs:  Pulse/HCG Rate: (!) 5961 Temp: 97.6 F (36.4 C) Resp: 11 BP: (!) 144/103 SpO2: 95 %  EBL: None  Complications: No immediate post-treatment complications observed by team, or reported by patient.  Note: The patient tolerated the entire procedure well. A repeat set of vitals were taken after the procedure and the patient was kept under observation following institutional policy, for this type of procedure. Post-procedural neurological assessment was performed, showing return to baseline, prior to discharge. The patient was provided with post-procedure discharge instructions, including a section on how to identify potential problems. Should any problems arise concerning this  procedure, the patient was given instructions to immediately contact us, at any time, without hesitation. In any case, we plan to contact the patient by telephone for a follow-up status report regarding this interventional procedure.  Comments:  No additional relevant information.  Plan of Care  Patient's urine drug screen was reviewed and appropriate.  [place] PMP checked and appropriate.  Last opioid prescription fill was in January 2019.  Will have patient sign opioid contract and start oxycodone 5 mg twice daily as needed, quantity 60 for the month.   Imaging Orders     DG C-Arm 1-60 Min-No Report Procedure Orders    No procedure(s) ordered today    Medications ordered for procedure: Meds ordered this encounter  Medications  . lactated ringers infusion 1,000 mL  . fentaNYL (SUBLIMAZE) injection 25-100 mcg    Make sure Narcan is available in the pyxis when using this medication. In the event of respiratory depression (RR< 8/min): Titrate NARCAN (naloxone) in increments of 0.1 to 0.2 mg IV at 2-3 minute intervals, until desired degree of reversal.  . lidocaine (XYLOCAINE) 1 % (with pres) injection 10 mL  . ropivacaine (PF) 2 mg/mL (0.2%) (NAROPIN) injection 10 mL  . dexamethasone (DECADRON) injection 10 mg  . oxyCODONE (OXY IR/ROXICODONE) 5 MG immediate release tablet    Sig: Take 1 tablet (5 mg total) by mouth every 12 (twelve) hours as needed for severe pain.    Dispense:  60 tablet    Refill:  0    Do not place this medication, or any other prescription from our practice, on "Automatic Refill". Patient may have prescription filled one day early if pharmacy is closed on scheduled refill date.  To last for 30 days from fill date   Medications administered: Zakai L. Hinderer had no medications administered during this visit.  See the medical record for exact dosing, route, and time of administration.  New Prescriptions   OXYCODONE (OXY IR/ROXICODONE) 5 MG IMMEDIATE  RELEASE TABLET  Take 1 tablet (5 mg total) by mouth every 12 (twelve) hours as needed for severe pain.   Disposition: Discharge home  Discharge Date & Time: 01/22/2018; 0945 hrs.   Physician-requested Follow-up: Return in about 3 weeks (around 02/12/2018) for Post Procedure Evaluation, Medication Management.  Future Appointments  Date Time Provider [place]  02/13/2018 12:15 PM Gillis Santa, MD [facility] None   Primary Care Physician: Theotis Burrow, MD Location: [facility] Facility Note by: Gillis Santa, MD Date: 01/22/2018; Time: 10:22 AM  Disclaimer:  Medicine is not an exact science. The only guarantee in medicine is that nothing is guaranteed. It is important to note that the decision to proceed with this intervention was based on the information collected from the patient. The Data and conclusions were drawn from the patient's questionnaire, the interview, and the physical examination. Because the information was provided in large part by the patient, it cannot be guaranteed that it has not been purposely or unconsciously manipulated. Every effort has been made to obtain as much relevant data as possible for this evaluation. It is important to note that the conclusions that lead to this procedure are derived in large part from the available data. Always take into account that the treatment will also be dependent on availability of resources and existing treatment guidelines, considered by other Pain Management Practitioners as being common knowledge and practice, at the time of the intervention. For Medico-Legal purposes, it is also important to point out that variation in procedural techniques and pharmacological choices are the acceptable norm. The indications, contraindications, technique, and results of the above procedure should only be interpreted and judged by a Board-Certified Interventional Pain Specialist with extensive familiarity and expertise  in the same exact procedure and technique.

## 2018-01-22 NOTE — Patient Instructions (Signed)
Facet Blocks Patient Information  Description: The facets are joints in the spine between the vertebrae.  Like any joints in the body, facets can become irritated and painful.  Arthritis can also effect the facets.  By injecting steroids and local anesthetic in and around these joints, we can temporarily block the nerve supply to them.  Steroids act directly on irritated nerves and tissues to reduce selling and inflammation which often leads to decreased pain.  Facet blocks may be done anywhere along the spine from the neck to the low back depending upon the location of your pain.   After numbing the skin with local anesthetic (like Novocaine), a small needle is passed onto the facet joints under x-ray guidance.  You may experience a sensation of pressure while this is being done.  The entire block usually lasts about 15-25 minutes.   Conditions which may be treated by facet blocks:   Low back/buttock pain  Neck/shoulder pain  Certain types of headaches  Preparation for the injection:  1. Do not eat any solid food or dairy products within 8 hours of your appointment. 2. You may drink clear liquid up to 3 hours before appointment.  Clear liquids include water, black coffee, juice or soda.  No milk or cream please. 3. You may take your regular medication, including pain medications, with a sip of water before your appointment.  Diabetics should hold regular insulin (if taken separately) and take 1/2 normal NPH dose the morning of the procedure.  Carry some sugar containing items with you to your appointment. 4. A driver must accompany you and be prepared to drive you home after your procedure. 5. Bring all your current medications with you. 6. An IV may be inserted and sedation may be given at the discretion of the physician. 7. A blood pressure cuff, EKG and other monitors will often be applied during the procedure.  Some patients may need to have extra oxygen administered for a short  period. 8. You will be asked to provide medical information, including your allergies and medications, prior to the procedure.  We must know immediately if you are taking blood thinners (like Coumadin/Warfarin) or if you are allergic to IV iodine contrast (dye).  We must know if you could possible be pregnant.  Possible side-effects:   Bleeding from needle site  Infection (rare, may require surgery)  Nerve injury (rare)  Numbness & tingling (temporary)  Difficulty urinating (rare, temporary)  Spinal headache (a headache worse with upright posture)  Light-headedness (temporary)  Pain at injection site (serveral days)  Decreased blood pressure (rare, temporary)  Weakness in arm/leg (temporary)  Pressure sensation in back/neck (temporary)   Call if you experience:   Fever/chills associated with headache or increased back/neck pain  Headache worsened by an upright position  New onset, weakness or numbness of an extremity below the injection site  Hives or difficulty breathing (go to the emergency room)  Inflammation or drainage at the injection site(s)  Severe back/neck pain greater than usual  New symptoms which are concerning to you  Please note:  Although the local anesthetic injected can often make your back or neck feel good for several hours after the injection, the pain will likely return. It takes 3-7 days for steroids to work.  You may not notice any pain relief for at least one week.  If effective, we will often do a series of 2-3 injections spaced 3-6 weeks apart to maximally decrease your pain.  After the initial   series, you may be a candidate for a more permanent nerve block of the facets.  If you have any questions, please call [phone] [facility]  Pain Management Discharge Instructions  General Discharge Instructions :  If you need to reach your doctor call: Monday-Friday 8:00 am - 4:00 pm at [phone] or  toll free [phone].  After clinic hours [phone] to have operator reach doctor.  Bring all of your medication bottles to all your appointments in the pain clinic.  To cancel or reschedule your appointment with Pain Management please remember to call 24 hours in advance to avoid a fee.  Refer to the educational materials which you have been given on: General Risks, I had my Procedure. Discharge Instructions, Post Sedation.  Post Procedure Instructions:  The drugs you were given will stay in your system until tomorrow, so for the next 24 hours you should not drive, make any legal decisions or drink any alcoholic beverages.  You may eat anything you prefer, but it is better to start with liquids then soups and crackers, and gradually work up to solid foods.  Please notify your doctor immediately if you have any unusual bleeding, trouble breathing or pain that is not related to your normal pain.  Depending on the type of procedure that was done, some parts of your body may feel week and/or numb.  This usually clears up by tonight or the next day.  Walk with the use of an assistive device or accompanied by an adult for the 24 hours.  You may use ice on the affected area for the first 24 hours.  Put ice in a Ziploc bag and cover with a towel and place against area 15 minutes on 15 minutes off.  You may switch to heat after 24 hours. 

## 2018-01-23 ENCOUNTER — Telehealth: Payer: Self-pay | Admitting: *Deleted

## 2018-01-23 NOTE — Telephone Encounter (Signed)
No problems post procedure. 

## 2018-02-13 ENCOUNTER — Ambulatory Visit
Payer: Medicaid Other | Attending: Student in an Organized Health Care Education/Training Program | Admitting: Student in an Organized Health Care Education/Training Program

## 2018-02-13 ENCOUNTER — Other Ambulatory Visit: Payer: Self-pay

## 2018-02-13 ENCOUNTER — Encounter: Payer: Self-pay | Admitting: Student in an Organized Health Care Education/Training Program

## 2018-02-13 VITALS — BP 118/87 | HR 65 | Temp 98.0°F | Resp 1 | Ht 71.0 in | Wt 180.0 lb

## 2018-02-13 DIAGNOSIS — K5909 Other constipation: Secondary | ICD-10-CM | POA: Diagnosis not present

## 2018-02-13 DIAGNOSIS — R413 Other amnesia: Secondary | ICD-10-CM | POA: Diagnosis not present

## 2018-02-13 DIAGNOSIS — F419 Anxiety disorder, unspecified: Secondary | ICD-10-CM | POA: Insufficient documentation

## 2018-02-13 DIAGNOSIS — Z0289 Encounter for other administrative examinations: Secondary | ICD-10-CM

## 2018-02-13 DIAGNOSIS — M19011 Primary osteoarthritis, right shoulder: Secondary | ICD-10-CM | POA: Insufficient documentation

## 2018-02-13 DIAGNOSIS — R350 Frequency of micturition: Secondary | ICD-10-CM | POA: Insufficient documentation

## 2018-02-13 DIAGNOSIS — Z803 Family history of malignant neoplasm of breast: Secondary | ICD-10-CM | POA: Insufficient documentation

## 2018-02-13 DIAGNOSIS — Z8619 Personal history of other infectious and parasitic diseases: Secondary | ICD-10-CM | POA: Diagnosis not present

## 2018-02-13 DIAGNOSIS — M25562 Pain in left knee: Secondary | ICD-10-CM | POA: Diagnosis not present

## 2018-02-13 DIAGNOSIS — E039 Hypothyroidism, unspecified: Secondary | ICD-10-CM | POA: Diagnosis not present

## 2018-02-13 DIAGNOSIS — G894 Chronic pain syndrome: Secondary | ICD-10-CM | POA: Diagnosis not present

## 2018-02-13 DIAGNOSIS — M47812 Spondylosis without myelopathy or radiculopathy, cervical region: Secondary | ICD-10-CM | POA: Diagnosis not present

## 2018-02-13 DIAGNOSIS — Z9889 Other specified postprocedural states: Secondary | ICD-10-CM

## 2018-02-13 DIAGNOSIS — Z841 Family history of disorders of kidney and ureter: Secondary | ICD-10-CM | POA: Diagnosis not present

## 2018-02-13 DIAGNOSIS — N400 Enlarged prostate without lower urinary tract symptoms: Secondary | ICD-10-CM | POA: Diagnosis not present

## 2018-02-13 DIAGNOSIS — Z87891 Personal history of nicotine dependence: Secondary | ICD-10-CM | POA: Insufficient documentation

## 2018-02-13 DIAGNOSIS — Z79890 Hormone replacement therapy (postmenopausal): Secondary | ICD-10-CM | POA: Insufficient documentation

## 2018-02-13 DIAGNOSIS — Z8052 Family history of malignant neoplasm of bladder: Secondary | ICD-10-CM | POA: Diagnosis not present

## 2018-02-13 DIAGNOSIS — F329 Major depressive disorder, single episode, unspecified: Secondary | ICD-10-CM | POA: Diagnosis not present

## 2018-02-13 DIAGNOSIS — Z79891 Long term (current) use of opiate analgesic: Secondary | ICD-10-CM | POA: Diagnosis not present

## 2018-02-13 DIAGNOSIS — Z888 Allergy status to other drugs, medicaments and biological substances status: Secondary | ICD-10-CM | POA: Insufficient documentation

## 2018-02-13 DIAGNOSIS — M4722 Other spondylosis with radiculopathy, cervical region: Secondary | ICD-10-CM | POA: Diagnosis not present

## 2018-02-13 DIAGNOSIS — Z79899 Other long term (current) drug therapy: Secondary | ICD-10-CM | POA: Diagnosis not present

## 2018-02-13 DIAGNOSIS — Z8719 Personal history of other diseases of the digestive system: Secondary | ICD-10-CM | POA: Diagnosis not present

## 2018-02-13 DIAGNOSIS — Z8042 Family history of malignant neoplasm of prostate: Secondary | ICD-10-CM | POA: Insufficient documentation

## 2018-02-13 DIAGNOSIS — N401 Enlarged prostate with lower urinary tract symptoms: Secondary | ICD-10-CM | POA: Insufficient documentation

## 2018-02-13 DIAGNOSIS — M542 Cervicalgia: Secondary | ICD-10-CM

## 2018-02-13 DIAGNOSIS — M25561 Pain in right knee: Secondary | ICD-10-CM | POA: Insufficient documentation

## 2018-02-13 DIAGNOSIS — M5010 Cervical disc disorder with radiculopathy, unspecified cervical region: Secondary | ICD-10-CM | POA: Diagnosis not present

## 2018-02-13 DIAGNOSIS — M79671 Pain in right foot: Secondary | ICD-10-CM | POA: Diagnosis present

## 2018-02-13 MED ORDER — OXYCODONE HCL 5 MG PO TABS: 5.0000 mg | ORAL_TABLET | Freq: Two times a day (BID) | ORAL | 0 refills | Status: DC | PRN

## 2018-02-13 NOTE — Progress Notes (Signed)
Patient's Name: Hayden Hall  MRN: [id number]  Referring Provider: Theotis Burrow*  DOB: August 17, 1956  PCP: Theotis Burrow, MD  DOS: 02/13/2018  Note by: Gillis Santa, MD  Service setting: Ambulatory outpatient  Specialty: Interventional Pain Management  Location: [facility] (AMB) Pain Management Facility    Patient type: Established   Primary Reason(s) for Visit: Encounter for prescription drug management & post-procedure evaluation of chronic illness with mild to moderate exacerbation(Level of risk: moderate) CC: Foot Pain (right); Shoulder Pain (right); Neck Pain; and Knee Pain (bilaterally)  HPI  Hayden Hall is a 62 y.o. year old, male patient, who comes today for a post-procedure evaluation and medication management. He has BPH with obstruction/lower urinary tract symptoms; Cervicalgia; History of rotator cuff surgery; Cervical radiculopathy; DDD (degenerative disc disease), cervical; Chronic pain syndrome; Primary osteoarthritis of right shoulder; and Osteoarthritis of spine with radiculopathy, cervical region on their problem list. His primarily concern today is the Foot Pain (right); Shoulder Pain (right); Neck Pain; and Knee Pain (bilaterally)  Pain Assessment: Location: Right Foot Radiating: radiates through ankle and around top of foot, sometimes up to knee; independent of this, neck pain radiates to right and/or left shoulder Onset: More than a month ago Duration: Chronic pain Quality: Burning, Numbness, Constant Severity: 7 /10 (subjective, self-reported pain score)  Note: Reported level is compatible with observation.                         When using our objective Pain Scale, levels between 6 and 10/10 are said to belong in an emergency room, as it progressively worsens from a 6/10, described as severely limiting, requiring emergency care not usually available at an outpatient pain management facility. At a 6/10 level, communication becomes difficult and requires  great effort. Assistance to reach the emergency department may be required. Facial flushing and profuse sweating along with potentially dangerous increases in heart rate and blood pressure will be evident. Effect on ADL:   Timing: Constant Modifying factors: medications BP: 118/87  HR: 65  Hayden Hall was last seen on 01/23/2018 for a procedure. During today's appointment we reviewed Hayden Hall post-procedure results, as well as his outpatient medication regimen.  Further details on both, my assessment(s), as well as the proposed treatment plan, please see below.  Controlled Substance Pharmacotherapy Assessment REMS (Risk Evaluation and Mitigation Strategy)  Analgesic: Oxycodone 5 mg BID prn, #60/mo MME/day: 15 mg/day.  [name], RN  02/13/2018 11:57 AM  Sign at close encounter Bottle presented however markings are worn due to carrying bottle in pocket, per patient.4 pills remain. Pt states last administration was yesterday.   Pharmacokinetics: Liberation and absorption (onset of action): WNL Distribution (time to peak effect): WNL Metabolism and excretion (duration of action): WNL         Pharmacodynamics: Desired effects: Analgesia: Hayden Hall reports >50% benefit. Functional ability: Patient reports that medication allows him to accomplish basic ADLs Clinically meaningful improvement in function (CMIF): Sustained CMIF goals met Perceived effectiveness: Described as relatively effective, allowing for increase in activities of daily living (ADL) Undesirable effects: Side-effects or Adverse reactions: None reported Monitoring: [place] PMP: Online review of the past 62-monthperiod conducted. Compliant with practice rules and regulations Last UDS on record: Summary  Date Value Ref Range Status  01/04/2018 FINAL  Final    Comment:    ==================================================================== [facility] COMP DRUG  ANALYSIS,UR ==================================================================== Test  Result       Flag       Units Drug Present not Declared for Prescription Verification   Naproxen                       PRESENT      UNEXPECTED Drug Absent but Declared for Prescription Verification   Tramadol                       Not Detected UNEXPECTED ng/mg creat   Tizanidine                     Not Detected UNEXPECTED    Tizanidine, as indicated in the declared medication list, is not    always detected even when used as directed.   Diphenhydramine                Not Detected UNEXPECTED   Promethazine                   Not Detected UNEXPECTED ==================================================================== Test                      Result    Flag   Units      Ref Range   Creatinine              29               mg/dL      >=20 ==================================================================== Declared Medications:  The flagging and interpretation on this report are based on the  following declared medications.  Unexpected results may arise from  inaccuracies in the declared medications.  **Note: The testing scope of this panel includes these medications:  Diphenhydramine  Promethazine (Phenergan)  Tramadol (Ultram)  **Note: The testing scope of this panel does not include small to  moderate amounts of these reported medications:  Tizanidine (Zanaflex)  **Note: The testing scope of this panel does not include following  reported medications:  Cyanocobalamin  Hydrocortisone  Levothyroxine  Multivitamin  Nystatin  Omeprazole  Sulfamethoxazole (Bactrim)  Trimethoprim (Bactrim)  Undefined Miscellaneous Drug  Valacyclovir (Valtrex)  Vitamin C ==================================================================== For clinical consultation, please call [phone]. ====================================================================    UDS interpretation:  Compliant          Medication Assessment Form: Reviewed. Patient indicates being compliant with therapy Treatment compliance: Non-compliant. Steps taken to remind the patient of the seriousness of adequate therapy compliance patient has been over-taking medications, up to 4-5x/dau Risk Assessment Profile: Aberrant behavior: See prior evaluations. None observed or detected today Comorbid factors increasing risk of overdose: See prior notes. No additional risks detected today Risk of substance use disorder (SUD): High Opioid Risk Tool - 02/13/18 1158      Family History of Substance Abuse   Alcohol  Positive Male    Illegal Drugs  Negative    Rx Drugs  Negative      Personal History of Substance Abuse   Alcohol  Negative    Illegal Drugs  Positive Male or Male    Rx Drugs  Positive Male or Male      Age   Age between 28-45 years   No      History of Preadolescent Sexual Abuse   History of Preadolescent Sexual Abuse  Negative or Male      Psychological Disease   Psychological Disease  Positive    Bipolar  Positive    Schizophrenia  Positive    Depression  Positive      Total Score   Opioid Risk Tool Scoring  13    Opioid Risk Interpretation  High Risk      ORT Scoring interpretation table:  Score <3 = Low Risk for SUD  Score between 4-7 = Moderate Risk for SUD  Score >8 = High Risk for Opioid Abuse   Risk Mitigation Strategies:  Patient Counseling: Covered Patient-Prescriber Agreement (PPA): Present and active  Notification to other healthcare providers: Done  Pharmacologic Plan: No change in therapy, at this time.             Post-Procedure Assessment  01/22/2018 Procedure: Right C3, C4, C5, C6, C7 facet medial branch nerve block Pre-procedure pain score:  10/10 Post-procedure pain score: 3/10         Influential Factors: BMI: 25.10 kg/m Intra-procedural challenges: None observed.         Assessment challenges: None detected.              Reported  side-effects: None.        Post-procedural adverse reactions or complications: None reported         Sedation: Please see nurses note. When no sedatives are used, the analgesic levels obtained are directly associated to the effectiveness of the local anesthetics. However, when sedation is provided, the level of analgesia obtained during the initial 1 hour following the intervention, is believed to be the result of a combination of factors. These factors may include, but are not limited to: 1. The effectiveness of the local anesthetics used. 2. The effects of the analgesic(s) and/or anxiolytic(s) used. 3. The degree of discomfort experienced by the patient at the time of the procedure. 4. The patients ability and reliability in recalling and recording the events. 5. The presence and influence of possible secondary gains and/or psychosocial factors. Reported result: Relief experienced during the 1st hour after the procedure: (pain was "some better") (Ultra-Short Term Relief)            Interpretative annotation: Clinically appropriate result. Analgesia during this period is likely to be Local Anesthetic and/or IV Sedative (Analgesic/Anxiolytic) related.          Effects of local anesthetic: The analgesic effects attained during this period are directly associated to the localized infiltration of local anesthetics and therefore cary significant diagnostic value as to the etiological location, or anatomical origin, of the pain. Expected duration of relief is directly dependent on the pharmacodynamics of the local anesthetic used. Long-acting (4-6 hours) anesthetics used.  Reported result: Relief during the next 4 to 6 hour after the procedure: 20 % (Short-Term Relief)            Interpretative annotation: Clinically appropriate result. Analgesia during this period is likely to be Local Anesthetic-related.          Long-term benefit: Defined as the period of time past the expected duration of local  anesthetics (1 hour for short-acting and 4-6 hours for long-acting). With the possible exception of prolonged sympathetic blockade from the local anesthetics, benefits during this period are typically attributed to, or associated with, other factors such as analgesic sensory neuropraxia, antiinflammatory effects, or beneficial biochemical changes provided by agents other than the local anesthetics.  Reported result: Extended relief following procedure: 75 %(as the day goes on, pain goes up to a 10) (Long-Term Relief)            Interpretative annotation: Clinically appropriate result. Good relief. No permanent  benefit expected. Inflammation plays a part in the etiology to the pain.          Current benefits: Defined as reported results that persistent at this point in time.   Analgesia: 50-75 %            Function: Somewhat improved ROM: Somewhat improved Interpretative annotation: Ongoing benefit. No permanent benefit expected. Effective diagnostic intervention.          Interpretation: Results would suggest a successful diagnostic and therapeutic intervention.                  Plan:  Please see "Plan of Care" for details.                Laboratory Chemistry  Inflammation Markers (CRP: Acute Phase) (ESR: Chronic Phase) No results found for: CRP, ESRSEDRATE, LATICACIDVEN                       Rheumatology Markers No results found for: RF, ANA, LABURIC, URICUR, LYMEIGGIGMAB, LYMEABIGMQN                      Renal Function Markers Lab Results  Component Value Date   BUN 26 (H) 05/17/2017   CREATININE 0.86 05/17/2017   GFRAA >60 05/17/2017   GFRNONAA >60 05/17/2017                              Hepatic Function Markers Lab Results  Component Value Date   AST 30 05/17/2017   ALT 18 05/17/2017   ALBUMIN 4.5 05/17/2017   ALKPHOS 39 05/17/2017   LIPASE 107 (H) 05/17/2017                        Electrolytes Lab Results  Component Value Date   NA 138 05/17/2017   K 4.3 05/17/2017    CL 105 05/17/2017   CALCIUM 9.6 05/17/2017                        Neuropathy Markers No results found for: VITAMINB12, FOLATE, HGBA1C, HIV                      Bone Pathology Markers No results found for: VD25OH, VD125OH2TOT, UD1497WY6, VZ8588FO2, 25OHVITD1, 25OHVITD2, 25OHVITD3, TESTOFREE, TESTOSTERONE                       Coagulation Parameters Lab Results  Component Value Date   PLT 275 05/17/2017                        Cardiovascular Markers Lab Results  Component Value Date   TROPONINI <0.03 12/03/2016   HGB 13.7 05/17/2017   HCT 40.7 05/17/2017                         CA Markers No results found for: CEA, CA125, LABCA2                      Note: Lab results reviewed.  Recent Diagnostic Imaging Results  DG C-Arm 1-60 Min-No Report Fluoroscopy was utilized by the requesting physician.  No radiographic  interpretation.   Complexity Note: Imaging results reviewed. Results shared with Mr. Lyssy, using Layman's terms.  Meds   Current Outpatient Medications:  .  Ascorbic Acid (VITAMIN C PO), Take by mouth., Disp: , Rfl:  .  Cyanocobalamin (VITAMIN B 12 PO), Take by mouth., Disp: , Rfl:  .  dicyclomine (BENTYL) 10 MG capsule, Take 10 mg by mouth 4 (four) times daily -  before meals and at bedtime., Disp: , Rfl:  .  Diphenhyd-Hydrocort-Nystatin (FIRST-DUKES MOUTHWASH MT), Reported on 10/19/2015, Disp: , Rfl: 6 .  levothyroxine (SYNTHROID, LEVOTHROID) 25 MCG tablet, Take 25 mcg by mouth daily., Disp: , Rfl: 4 .  Multiple Vitamin (MULTI-VITAMINS) TABS, Take by mouth., Disp: , Rfl:  .  Omeprazole (RA OMEPRAZOLE) 20 MG TBEC, Take 20 mg by mouth. Reported on 10/19/2015, Disp: , Rfl:  .  [START ON 02/20/2018] oxyCODONE (OXY IR/ROXICODONE) 5 MG immediate release tablet, Take 1 tablet (5 mg total) by mouth every 12 (twelve) hours as needed for severe pain. For chronic pain To last for 30 days from fill date, Disp: 60 tablet, Rfl: 0 .  tiZANidine  (ZANAFLEX) 4 MG tablet, Take 4 mg by mouth every 6 (six) hours as needed for muscle spasms., Disp: , Rfl:  .  valACYclovir (VALTREX) 500 MG tablet, Take 500 mg by mouth., Disp: , Rfl:  .  DOCOSAHEXAENOIC ACID PO, Take by mouth., Disp: , Rfl:  .  promethazine (PHENERGAN) 25 MG tablet, Take 25 mg by mouth. Reported on 10/19/2015, Disp: , Rfl:  .  sulfamethoxazole-trimethoprim (BACTRIM DS,SEPTRA DS) 800-160 MG tablet, Take 1 tablet by mouth 2 (two) times daily. (Patient not taking: Reported on 11/08/2016), Disp: 20 tablet, Rfl: 0 .  traMADol (ULTRAM) 50 MG tablet, Take by mouth every 6 (six) hours as needed. for pain, Disp: , Rfl: 3  ROS  Constitutional: Denies any fever or chills Gastrointestinal: No reported hemesis, hematochezia, vomiting, or acute GI distress Musculoskeletal: Denies any acute onset joint swelling, redness, loss of ROM, or weakness Neurological: No reported episodes of acute onset apraxia, aphasia, dysarthria, agnosia, amnesia, paralysis, loss of coordination, or loss of consciousness  Allergies  Mr. Crisafulli is allergic to vesicare [solifenacin].  [place]  Drug: Mr. Leeper  reports that he does not use drugs. Alcohol:  reports that he does not drink alcohol. Tobacco:  reports that he has quit smoking. He has never used smokeless tobacco. Medical:  has a past medical history of Anxiety, Arthritis, BPH (benign prostatic hyperplasia), Bronchitis, Chronic constipation, Chronic pain, Depression, Hepatitis C, Herpes, History of pancreatitis, History of seizure disorder, Hypothyroidism, Memory impairment, Myoclonic jerking, Neck injury, Nocturia, SOB (shortness of breath), Trigger finger, and Urinary frequency. Surgical: Mr. Mauss  has a past surgical history that includes Trigger finger release; Rotator cuff repair (Left); and Rotator cuff repair (Right). Family: family history includes Bladder Cancer in his mother; Breast cancer in his mother; Heart disease in his unknown relative;  Kidney disease in his cousin and mother; Prostate cancer in his father.  Constitutional Exam  General appearance: Well nourished, well developed, and well hydrated. In no apparent acute distress Vitals:   02/13/18 1137  BP: 118/87  Pulse: 65  Resp: (!) 1  Temp: 98 F (36.7 C)  TempSrc: Oral  SpO2: 100%  Weight: 180 lb (81.6 kg)  Height: '5\' 11"'$  (1.803 m)   BMI Assessment: Estimated body mass index is 25.1 kg/m as calculated from the following:   Height as of this encounter: '5\' 11"'$  (1.803 m).   Weight as of this encounter: 180 lb (81.6 kg).  BMI interpretation table: BMI level Category  Range association with higher incidence of chronic pain  <18 kg/m2 Underweight   18.5-24.9 kg/m2 Ideal body weight   25-29.9 kg/m2 Overweight Increased incidence by 20%  30-34.9 kg/m2 Obese (Class I) Increased incidence by 68%  35-39.9 kg/m2 Severe obesity (Class II) Increased incidence by 136%  >40 kg/m2 Extreme obesity (Class III) Increased incidence by 254%   Patient's current BMI Ideal Body weight  Body mass index is 25.1 kg/m. Ideal body weight: 75.3 kg (166 lb 0.1 oz) Adjusted ideal body weight: 77.8 kg (171 lb 9.7 oz)   BMI Readings from Last 4 Encounters:  02/13/18 25.10 kg/m  01/22/18 25.52 kg/m  01/04/18 25.10 kg/m  12/07/17 24.97 kg/m   Wt Readings from Last 4 Encounters:  02/13/18 180 lb (81.6 kg)  01/22/18 183 lb (83 kg)  01/04/18 180 lb (81.6 kg)  12/07/17 179 lb (81.2 kg)  Psych/Mental status: Alert, oriented x 3 (person, place, & time)       Eyes: PERLA Respiratory: No evidence of acute respiratory distress Cervical Spine Area Exam  Skin & Axial Inspection:No masses, redness, edema, swelling, or associated skin lesions Alignment:Symmetrical Functional HUD:[id number] ROM, bilaterally Stability:No instability detected Muscle Tone/Strength:Functionally intact. No obvious neuro-muscular anomalies detected. Sensory (Neurological):Dermatomal pain  pattern Palpation:Complains of area being tender to palpationPositive provocative maneuver forfor cervical facet disease         Upper Extremity (UE) Exam    Side:Right upper extremity  Side:Left upper extremity   Skin & Extremity Inspection:Evidence of prior arthroplastic surgery  Skin & Extremity Inspection:Evidence of prior arthroplastic surgery   Functional HYI:FOYDXAJOI ROMfor shoulder  Functional NOM:VEHMCNOBS ROMfor shoulder   Muscle Tone/Strength:Functionally intact. No obvious neuro-muscular anomalies detected.  Muscle Tone/Strength:Functionally intact. No obvious neuro-muscular anomalies detected.   Sensory (Neurological):Arthropathic arthralgia  Sensory (Neurological):Arthropathic arthralgia   Palpation:No palpable anomalies  Palpation:No palpable anomalies   Specialized Test(s):Deferred  Specialized Test(s):Deferred    Thoracic Spine Area Exam  Skin & Axial Inspection:No masses, redness, or swelling Alignment:Symmetrical Functional JGG:[id number] ROM Stability:No instability detected Muscle Tone/Strength:Functionally intact. No obvious neuro-muscular anomalies detected. Sensory (Neurological):Unimpaired Muscle strength & Tone:No palpable anomalies  Lumbar Spine Area Exam  Skin & Axial Inspection:No masses, redness, or swelling Alignment:Symmetrical Functional TKP:[id number] ROM Stability:No instability detected Muscle Tone/Strength:Functionally intact. No obvious neuro-muscular anomalies detected. Sensory (Neurological):Unimpaired Palpation:No palpable anomalies Provocative Tests: Lumbar Hyperextension and rotation test:Positivebilaterally for facet joint pain. Lumbar Lateral bending test:evaluation deferred today Patrick's Maneuver:evaluation deferred today  Gait & Posture Assessment   Ambulation:Unassisted Gait:Relatively normal for age and body habitus Posture:WNL  Lower Extremity Exam    Side:Right lower extremity  Side:Left lower extremity  Skin & Extremity Inspection:Skin color, temperature, and hair growth are WNL. No peripheral edema or cyanosis. No masses, redness, swelling, asymmetry, or associated skin lesions. No contractures.  Skin & Extremity Inspection:Skin color, temperature, and hair growth are WNL. No peripheral edema or cyanosis. No masses, redness, swelling, asymmetry, or associated skin lesions. No contractures.  Functional GYF:[id number] ROM  Functional BWG:[id number] ROM  Muscle Tone/Strength:Functionally intact. No obvious neuro-muscular anomalies detected.  Muscle Tone/Strength:Functionally intact. No obvious neuro-muscular anomalies detected.  Sensory (Neurological):Unimpaired  Sensory (Neurological):Unimpaired  Palpation:No palpable anomalies  Palpation:No palpable anomalies    Assessment  Primary Diagnosis & Pertinent Problem List: The primary encounter diagnosis was Primary osteoarthritis of right shoulder. Diagnoses of History of rotator cuff surgery, Cervical facet joint syndrome - R C3,4,5,6,7, Osteoarthritis of spine with radiculopathy, cervical region, Chronic pain syndrome, Pain management contract signed, and Cervicalgia were also pertinent to this visit.  Status Diagnosis  Having a Flare-up Persistent Persistent 1. Primary osteoarthritis of right shoulder   2. History of rotator cuff surgery   3. Cervical facet joint syndrome - R C3,4,5,6,7   4. Osteoarthritis of spine with radiculopathy, cervical region   5. Chronic pain syndrome   6. Pain management contract signed   7. Cervicalgia     Problems updated and reviewed during this visit: Problem  Primary Osteoarthritis of Right Shoulder  Osteoarthritis of Spine With Radiculopathy, Cervical Region   General Recommendations: The pain  condition that the patient suffers from is best treated with a multidisciplinary approach that involves an increase in physical activity to prevent de-conditioning and worsening of the pain cycle, as well as psychological counseling (formal and/or informal) to address the co-morbid psychological affects of pain. Treatment will often involve judicious use of pain medications and interventional procedures to decrease the pain, allowing the patient to participate in the physical activity that will ultimately produce long-lasting pain reductions. The goal of the multidisciplinary approach is to return the patient to a higher level of overall function and to restore their ability to perform activities of daily living.  62 year old male who presents with a chief complaint of neck pain, right heel pain, right shoulder pain. Patient has a history of 2 bilateral rotator cuff surgeries. He states that he is continuing to have severe neck pain that radiates down into his right shoulder and right arm. Patient also endorses issues with fine motor control and has had problems dropping objects in his left hand. He hastried various medications in the past including gabapentin which makes him sedated even at the lowest dose, meloxicam and other NSAIDs which upset his stomach, Tylenol which does nothing. Patient's cervical MRI is concerning for advanced cervical degenerative disc disease with right-sided facet arthropathy most pronounced at C3, 4, 5, 6.  Patient also has right neuroforaminal impingement at C3/4, C5/6 along with left foraminal impingement at C4/5 and C5/6.  Patient has mild spinal canal stenosis at C4/5 and C5/6.  Regards to his right shoulder MRI, patient has a large full-thickness retracted supraspinatus tendon tear with significant tendinopathy and moderate glenohumeral joint degenerative changes.  Patient presents today for postprocedural follow-up status post right C3, 4, 5, 6, 7 cervical facet medial  branch nerve blocks which were effective for his neck pain on the right.  He is continuing to endorse approximately 70 to 75% neck pain relief indicating a positive diagnostic block.  Patient is endorsing persistent right shoulder pain is interested in right shoulder injection.  Risks and benefits of right shoulder steroid injection were discussed and patient would like to proceed.  In regards to his right foot pain, I recommended the patient follow-up with podiatrist which he states he has an appointment with.  In regards to medication management, had an extensive discussion with the patient about medication compliance.  His oxycodone is prescribed 5 mg twice daily as needed however he has been taking it 4-5 times a day and will be out of his medications early.  I reviewed our opioid contract with the patient and stated that he should take no more than 2 tablets daily.  Patient endorsed understanding.  I will refill his oxycodone prescription as below, 1 month from his previous fill date.  Plan: -Successful diagnostic right C3, 4, 5, 6, 7 cervical facet medial branch nerve block.  Can repeat in the future if return of pain and can consider radiofrequency ablation of these nerves. -Scheduled for right posterior  shoulder joint steroid injection -Recommend patient follow-up with podiatrist regarding his right foot pain -Refill oxycodone prescription as below.  Patient counseled on medication compliance and his responsibilities when he comes for chronic opioid therapy.   Plan of Care  Pharmacotherapy (Medications Ordered): Meds ordered this encounter  Medications  . oxyCODONE (OXY IR/ROXICODONE) 5 MG immediate release tablet    Sig: Take 1 tablet (5 mg total) by mouth every 12 (twelve) hours as needed for severe pain. For chronic pain To last for 30 days from fill date    Dispense:  60 tablet    Refill:  0    Do not place this medication, or any other prescription from our practice, on "Automatic  Refill". Patient may have prescription filled one day early if pharmacy is closed on scheduled refill date.  To last for 30 days from fill date   Lab-work, procedure(s), and/or referral(s): Orders Placed This Encounter  Procedures  . SHOULDER INJECTION    Provider-requested follow-up: Return in about 8 days (around 02/21/2018) for Procedure. Time Note: Greater than 50% of the 25 minute(s) of face-to-face time spent with Mr. Sweetser, was spent in counseling/coordination of care regarding: the appropriate use of the pain scale, "Drug Holidays", Mr. Orsino primary cause of pain, the treatment plan, treatment alternatives, the risks and possible complications of proposed treatment, medication side effects, the opioid analgesic risks and possible complications, the results, interpretation and significance of  his recent diagnostic interventional treatment(s), the appropriate use of his medications, realistic expectations, the medication agreement and the patient's responsibilities when it comes to controlled substances.  Future Appointments  Date Time Provider [place]  02/19/2018  8:30 AM Edrick Kins, [place] [facility]  02/21/2018  8:45 AM Gillis Santa, MD [facility] None  03/22/2018 11:30 AM Gillis Santa, MD [facility] None    Primary Care Physician: Theotis Burrow, MD Location: [facility] Facility Note by: Gillis Santa, M.D Date: 02/13/2018; Time: 2:51 PM  Patient Instructions   Pain Management Discharge Instructions  General Discharge Instructions :  If you need to reach your doctor call: Monday-Friday 8:00 am - 4:00 pm at [phone] or toll free [phone].  After clinic hours [phone] to have operator reach doctor.  Bring all of your medication bottles to all your appointments in the pain clinic.  To cancel or reschedule your appointment with Pain Management please remember to call 24 hours in advance to avoid a  fee.  Refer to the educational materials which you have been given on: General Risks, I had my Procedure. Discharge Instructions, Post Sedation.  Post Procedure Instructions:  The drugs you were given will stay in your system until tomorrow, so for the next 24 hours you should not drive, make any legal decisions or drink any alcoholic beverages.  You may eat anything you prefer, but it is better to start with liquids then soups and crackers, and gradually work up to solid foods.  Please notify your doctor immediately if you have any unusual bleeding, trouble breathing or pain that is not related to your normal pain.  Depending on the type of procedure that was done, some parts of your body may feel week and/or numb.  This usually clears up by tonight or the next day.  Walk with the use of an assistive device or accompanied by an adult for the 24 hours.  You may use ice on the affected area for the first 24 hours.  Put ice in a Ziploc bag and  cover with a towel and place against area 15 minutes on 15 minutes off.  You may switch to heat after 24 hours.GENERAL RISKS AND COMPLICATIONS  What are the risk, side effects and possible complications? Generally speaking, most procedures are safe.  However, with any procedure there are risks, side effects, and the possibility of complications.  The risks and complications are dependent upon the sites that are lesioned, or the type of nerve block to be performed.  The closer the procedure is to the spine, the more serious the risks are.  Great care is taken when placing the radio frequency needles, block needles or lesioning probes, but sometimes complications can occur. 1. Infection: Any time there is an injection through the skin, there is a risk of infection.  This is why sterile conditions are used for these blocks.  There are four possible types of infection. 1. Localized skin infection. 2. Central Nervous System Infection-This can be in the form of  Meningitis, which can be deadly. 3. Epidural Infections-This can be in the form of an epidural abscess, which can cause pressure inside of the spine, causing compression of the spinal cord with subsequent paralysis. This would require an emergency surgery to decompress, and there are no guarantees that the patient would recover from the paralysis. 4. Discitis-This is an infection of the intervertebral discs.  It occurs in about 1% of discography procedures.  It is difficult to treat and it may lead to surgery.        2. Pain: the needles have to go through skin and soft tissues, will cause soreness.       3. Damage to internal structures:  The nerves to be lesioned may be near blood vessels or    other nerves which can be potentially damaged.       4. Bleeding: Bleeding is more common if the patient is taking blood thinners such as  aspirin, Coumadin, Ticiid, Plavix, etc., or if he/she have some genetic predisposition  such as hemophilia. Bleeding into the spinal canal can cause compression of the spinal  cord with subsequent paralysis.  This would require an emergency surgery to  decompress and there are no guarantees that the patient would recover from the  paralysis.       5. Pneumothorax:  Puncturing of a lung is a possibility, every time a needle is introduced in  the area of the chest or upper back.  Pneumothorax refers to free air around the  collapsed lung(s), inside of the thoracic cavity (chest cavity).  Another two possible  complications related to a similar event would include: Hemothorax and Chylothorax.   These are variations of the Pneumothorax, where instead of air around the collapsed  lung(s), you may have blood or chyle, respectively.       6. Spinal headaches: They may occur with any procedures in the area of the spine.       7. Persistent CSF (Cerebro-Spinal Fluid) leakage: This is a rare problem, but may occur  with prolonged intrathecal or epidural catheters either due to the  formation of a fistulous  track or a dural tear.       8. Nerve damage: By working so close to the spinal cord, there is always a possibility of  nerve damage, which could be as serious as a permanent spinal cord injury with  paralysis.       9. Death:  Although rare, severe deadly allergic reactions known as "Anaphylactic  reaction" can occur  to any of the medications used.      10. Worsening of the symptoms:  We can always make thing worse.  What are the chances of something like this happening? Chances of any of this occuring are extremely low.  By statistics, you have more of a chance of getting killed in a motor vehicle accident: while driving to the hospital than any of the above occurring .  Nevertheless, you should be aware that they are possibilities.  In general, it is similar to taking a shower.  Everybody knows that you can slip, hit your head and get killed.  Does that mean that you should not shower again?  Nevertheless always keep in mind that statistics do not mean anything if you happen to be on the wrong side of them.  Even if a procedure has a 1 (one) in a 1,000,000 (million) chance of going wrong, it you happen to be that one..Also, keep in mind that by statistics, you have more of a chance of having something go wrong when taking medications.  Who should not have this procedure? If you are on a blood thinning medication (e.g. Coumadin, Plavix, see list of "Blood Thinners"), or if you have an active infection going on, you should not have the procedure.  If you are taking any blood thinners, please inform your physician.  How should I prepare for this procedure?  Do not eat or drink anything at least six hours prior to the procedure.  Bring a driver with you .  It cannot be a taxi.  Come accompanied by an adult that can drive you back, and that is strong enough to help you if your legs get weak or numb from the local anesthetic.  Take all of your medicines the morning of the  procedure with just enough water to swallow them.  If you have diabetes, make sure that you are scheduled to have your procedure done first thing in the morning, whenever possible.  If you have diabetes, take only half of your insulin dose and notify our nurse that you have done so as soon as you arrive at the clinic.  If you are diabetic, but only take blood sugar pills (oral hypoglycemic), then do not take them on the morning of your procedure.  You may take them after you have had the procedure.  Do not take aspirin or any aspirin-containing medications, at least eleven (11) days prior to the procedure.  They may prolong bleeding.  Wear loose fitting clothing that may be easy to take off and that you would not mind if it got stained with Betadine or blood.  Do not wear any jewelry or perfume  Remove any nail coloring.  It will interfere with some of our monitoring equipment.  NOTE: Remember that this is not meant to be interpreted as a complete list of all possible complications.  Unforeseen problems may occur.  BLOOD THINNERS The following drugs contain aspirin or other products, which can cause increased bleeding during surgery and should not be taken for 2 weeks prior to and 1 week after surgery.  If you should need take something for relief of minor pain, you may take acetaminophen which is found in Tylenol,m Datril, Anacin-3 and Panadol. It is not blood thinner. The products listed below are.  Do not take any of the products listed below in addition to any listed on your instruction sheet.  A.P.C or A.P.C with Codeine Codeine Phosphate Capsules #3 Ibuprofen Ridaura  ABC  compound Congesprin Imuran rimadil  Advil Cope Indocin Robaxisal  Alka-Seltzer Effervescent Pain Reliever and Antacid Coricidin or Coricidin-D  Indomethacin Rufen  Alka-Seltzer plus Cold Medicine Cosprin Ketoprofen S-A-C Tablets  Anacin Analgesic Tablets or Capsules Coumadin Korlgesic Salflex  Anacin Extra Strength  Analgesic tablets or capsules CP-2 Tablets Lanoril Salicylate  Anaprox Cuprimine Capsules Levenox Salocol  Anexsia-D Dalteparin Magan Salsalate  Anodynos Darvon compound Magnesium Salicylate Sine-off  Ansaid Dasin Capsules Magsal Sodium Salicylate  Anturane Depen Capsules Marnal Soma  APF Arthritis pain formula Dewitt's Pills Measurin Stanback  Argesic Dia-Gesic Meclofenamic Sulfinpyrazone  Arthritis Bayer Timed Release Aspirin Diclofenac Meclomen Sulindac  Arthritis pain formula Anacin Dicumarol Medipren Supac  Analgesic (Safety coated) Arthralgen Diffunasal Mefanamic Suprofen  Arthritis Strength Bufferin Dihydrocodeine Mepro Compound Suprol  Arthropan liquid Dopirydamole Methcarbomol with Aspirin Synalgos  ASA tablets/Enseals Disalcid Micrainin Tagament  Ascriptin Doan's Midol Talwin  Ascriptin A/D Dolene Mobidin Tanderil  Ascriptin Extra Strength Dolobid Moblgesic Ticlid  Ascriptin with Codeine Doloprin or Doloprin with Codeine Momentum Tolectin  Asperbuf Duoprin Mono-gesic Trendar  Aspergum Duradyne Motrin or Motrin IB Triminicin  Aspirin plain, buffered or enteric coated Durasal Myochrisine Trigesic  Aspirin Suppositories Easprin Nalfon Trillsate  Aspirin with Codeine Ecotrin Regular or Extra Strength Naprosyn Uracel  Atromid-S Efficin Naproxen Ursinus  Auranofin Capsules Elmiron Neocylate Vanquish  Axotal Emagrin Norgesic Verin  Azathioprine Empirin or Empirin with Codeine Normiflo Vitamin E  Azolid Emprazil Nuprin Voltaren  Bayer Aspirin plain, buffered or children's or timed BC Tablets or powders Encaprin Orgaran Warfarin Sodium  Buff-a-Comp Enoxaparin Orudis Zorpin  Buff-a-Comp with Codeine Equegesic Os-Cal-Gesic   Buffaprin Excedrin plain, buffered or Extra Strength Oxalid   Bufferin Arthritis Strength Feldene Oxphenbutazone   Bufferin plain or Extra Strength Feldene Capsules Oxycodone with Aspirin   Bufferin with Codeine Fenoprofen Fenoprofen Pabalate or Pabalate-SF    Buffets II Flogesic Panagesic   Buffinol plain or Extra Strength Florinal or Florinal with Codeine Panwarfarin   Buf-Tabs Flurbiprofen Penicillamine   Butalbital Compound Four-way cold tablets Penicillin   Butazolidin Fragmin Pepto-Bismol   Carbenicillin Geminisyn Percodan   Carna Arthritis Reliever Geopen Persantine   Carprofen Gold's salt Persistin   Chloramphenicol Goody's Phenylbutazone   Chloromycetin Haltrain Piroxlcam   Clmetidine heparin Plaquenil   Cllnoril Hyco-pap Ponstel   Clofibrate Hydroxy chloroquine Propoxyphen         Before stopping any of these medications, be sure to consult the physician who ordered them.  Some, such as Coumadin (Warfarin) are ordered to prevent or treat serious conditions such as "deep thrombosis", "pumonary embolisms", and other heart problems.  The amount of time that you may need off of the medication may also vary with the medication and the reason for which you were taking it.  If you are taking any of these medications, please make sure you notify your pain physician before you undergo any procedures.

## 2018-02-13 NOTE — Progress Notes (Signed)
Bottle presented however markings are worn due to carrying bottle in pocket, per patient.4 pills remain. Pt states last administration was yesterday.

## 2018-02-13 NOTE — Patient Instructions (Signed)

## 2018-02-16 ENCOUNTER — Ambulatory Visit: Payer: Self-pay | Admitting: Podiatry

## 2018-02-19 ENCOUNTER — Emergency Department: Payer: Medicaid Other

## 2018-02-19 ENCOUNTER — Encounter: Payer: Self-pay | Admitting: Podiatry

## 2018-02-19 ENCOUNTER — Other Ambulatory Visit: Payer: Self-pay

## 2018-02-19 ENCOUNTER — Ambulatory Visit: Payer: Medicaid Other | Admitting: Podiatry

## 2018-02-19 ENCOUNTER — Encounter

## 2018-02-19 ENCOUNTER — Ambulatory Visit (INDEPENDENT_AMBULATORY_CARE_PROVIDER_SITE_OTHER): Payer: Medicaid Other

## 2018-02-19 ENCOUNTER — Emergency Department
Admission: EM | Admit: 2018-02-19 | Discharge: 2018-02-19 | Disposition: A | Discharge status: Home / Self Care | Payer: Medicaid Other | Attending: Emergency Medicine | Admitting: Emergency Medicine

## 2018-02-19 ENCOUNTER — Encounter: Payer: Self-pay | Admitting: Emergency Medicine

## 2018-02-19 DIAGNOSIS — T594X4A Toxic effect of chlorine gas, undetermined, initial encounter: Secondary | ICD-10-CM | POA: Diagnosis not present

## 2018-02-19 DIAGNOSIS — E039 Hypothyroidism, unspecified: Secondary | ICD-10-CM | POA: Insufficient documentation

## 2018-02-19 DIAGNOSIS — M722 Plantar fascial fibromatosis: Secondary | ICD-10-CM

## 2018-02-19 DIAGNOSIS — Z87891 Personal history of nicotine dependence: Secondary | ICD-10-CM | POA: Insufficient documentation

## 2018-02-19 DIAGNOSIS — Z79899 Other long term (current) drug therapy: Secondary | ICD-10-CM | POA: Diagnosis not present

## 2018-02-19 DIAGNOSIS — M79671 Pain in right foot: Secondary | ICD-10-CM

## 2018-02-19 DIAGNOSIS — R05 Cough: Secondary | ICD-10-CM | POA: Diagnosis present

## 2018-02-19 DIAGNOSIS — R062 Wheezing: Secondary | ICD-10-CM | POA: Insufficient documentation

## 2018-02-19 DIAGNOSIS — J9801 Acute bronchospasm: Secondary | ICD-10-CM | POA: Insufficient documentation

## 2018-02-19 DIAGNOSIS — M79672 Pain in left foot: Principal | ICD-10-CM

## 2018-02-19 MED ORDER — PSEUDOEPH-BROMPHEN-DM 30-2-10 MG/5ML PO SYRP: 5.0000 mL | ORAL_SOLUTION | Freq: Four times a day (QID) | ORAL | 0 refills | Status: DC | PRN

## 2018-02-19 NOTE — ED Notes (Signed)
See triage note  Presents with cough,sinus pressure and nasal congestion for couple of days  No fever  But states he mixed a couple of bleaches together yesterday  So sx's got worse

## 2018-02-19 NOTE — ED Provider Notes (Signed)
[facility] Emergency Department Provider Note    ____________________________________________   First MD Initiated Contact with Patient 02/19/18 1159     (approximate)  I have reviewed the triage vital signs and the nursing notes.   HISTORY  Chief Complaint Cough; Nasal Congestion; and Toxic Inhalation    HPI Hayden Hall is a 62 y.o. male patient complaining of cough secondary to inhaling fumes from a mixture of bleach and washing detergent.  Patient states cough is productive producing green phlegm.  Patient states wheezing but denies shortness of breath.  No palliative measure for complaint.  Past Medical History:  Diagnosis Date  . Anxiety   . Arthritis    RA   . BPH (benign prostatic hyperplasia)   . Bronchitis   . Chronic constipation   . Chronic pain   . Depression   . Hepatitis C   . Herpes   . History of pancreatitis   . History of seizure disorder   . Hypothyroidism   . Memory impairment   . Myoclonic jerking   . Neck injury   . Nocturia   . SOB (shortness of breath)   . Trigger finger   . Urinary frequency     Patient Active Problem List   Diagnosis Date Noted  . 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 Osteoarthritis of spine with radiculopathy, cervical region 02/13/2018  . Cervicalgia 12/07/2017  . History of rotator cuff surgery 12/07/2017  . Cervical radiculopathy 12/07/2017  . DDD (degenerative disc disease), cervical 12/07/2017  . Chronic pain syndrome 12/07/2017  . BPH with obstruction/lower urinary tract symptoms 10/21/2015    Past Surgical History:  Procedure Laterality Date  . ROTATOR CUFF REPAIR Left   . ROTATOR CUFF REPAIR Right   . TRIGGER FINGER RELEASE      Prior to Admission medications   Medication Sig Start Date End Date Taking? Authorizing Provider  Ascorbic Acid (VITAMIN C PO) Take by mouth.    [provider]  brompheniramine-pseudoephedrine-DM 30-2-10 MG/5ML  syrup Take 5 mLs by mouth 4 (four) times daily as needed. 02/19/18   Sable Feil, PA-C  Cyanocobalamin (VITAMIN B 12 PO) Take by mouth.    [provider]  dicyclomine (BENTYL) 10 MG capsule Take 10 mg by mouth 4 (four) times daily -  before meals and at bedtime.    [provider]  Diphenhyd-Hydrocort-Nystatin (FIRST-DUKES MOUTHWASH MT) Reported on 10/19/2015 09/28/15   [provider]  DOCOSAHEXAENOIC ACID PO Take by mouth.    [provider]  levothyroxine (SYNTHROID, LEVOTHROID) 25 MCG tablet Take 25 mcg by mouth daily. 09/16/15   [provider]  Multiple Vitamin (MULTI-VITAMINS) TABS Take by mouth.    [provider]  Omeprazole (RA OMEPRAZOLE) 20 MG TBEC Take 20 mg by mouth. Reported on 10/19/2015 12/24/08   [provider]  oxyCODONE (OXY IR/ROXICODONE) 5 MG immediate release tablet Take 1 tablet (5 mg total) by mouth every 12 (twelve) hours as needed for severe pain. For chronic pain To last for 30 days from fill date 02/20/18 03/22/18  Gillis Santa, MD  promethazine (PHENERGAN) 25 MG tablet Take 25 mg by mouth. Reported on 10/19/2015    [provider]  sulfamethoxazole-trimethoprim (BACTRIM DS,SEPTRA DS) 800-160 MG tablet Take 1 tablet by mouth 2 (two) times daily. Patient not taking: Reported on 11/08/2016 06/19/16   Sable Feil, PA-C  tiZANidine (ZANAFLEX) 4 MG tablet Take 4 mg by mouth every 6 (six) hours  as needed for muscle spasms.    [provider]  traMADol (ULTRAM) 50 MG tablet Take by mouth every 6 (six) hours as needed. for pain 08/20/15   [provider]  valACYclovir (VALTREX) 500 MG tablet Take 500 mg by mouth. 01/17/11   [provider]    Allergies Vesicare [solifenacin]  Family History  Problem Relation Age of Onset  . Breast cancer Mother   . Kidney disease Mother   . Bladder Cancer Mother   . Heart disease Unknown   . Kidney disease Cousin   . Prostate cancer  Father   . Kidney cancer Neg Hx     Social History Social History   Tobacco Use  . Smoking status: Former Research scientist (life sciences)  . Smokeless tobacco: Never Used  . Tobacco comment: quit 18 years  Substance Use Topics  . Alcohol use: No    Alcohol/week: 0.0 oz  . Drug use: No    Review of Systems Constitutional: No fever/chills Eyes: No visual changes. ENT: No sore throat. Cardiovascular: Denies chest pain. Respiratory: Denies shortness of breath.  Productive cough with wheezing. Gastrointestinal: No abdominal pain.  No nausea, no vomiting.  No diarrhea.  No constipation. Genitourinary: Negative for dysuria. Musculoskeletal: Negative for back pain. Skin: Negative for rash. Neurological: Negative for headaches, focal weakness or numbness. Psychiatric:Anxiety and depression. Endocrine:Hypothyroidism. Allergic/Immunilogical:Vesicare ____________________________________________   PHYSICAL EXAM:  VITAL SIGNS: ED Triage Vitals  Enc Vitals Group     BP 02/19/18 1134 133/85     Pulse Rate 02/19/18 1134 74     Resp 02/19/18 1134 20     Temp 02/19/18 1134 98.3 F (36.8 C)     Temp Source 02/19/18 1134 Oral     SpO2 --      Weight 02/19/18 1129 180 lb (81.6 kg)     Height 02/19/18 1129 5\' 11"  (1.803 m)     Head Circumference --      Peak Flow --      Pain Score 02/19/18 1129 8     Pain Loc --      Pain Edu? --      Excl. in [place]? --     Constitutional: Alert and oriented. Well appearing and in no acute distress. Nose: Clear rhinorrhea Mouth/Throat: Mucous membranes are moist.  Oropharynx non-erythematous.  Postnasal drainage Neck: No stridor.  Hematological/Lymphatic/Immunilogical: No cervical lymphadenopathy. Cardiovascular: Normal rate, regular rhythm. Grossly normal heart sounds.  Good peripheral circulation. Respiratory: Normal respiratory effort.  No retractions. Productive cough with mild wheezing. Skin:  Skin is warm, dry and intact. No rash noted. Psychiatric: Mood and  affect are normal. Speech and behavior are normal.  ____________________________________________   LABS (all labs ordered are listed, but only abnormal results are displayed)  Labs Reviewed - No data to display ____________________________________________  EKG   ____________________________________________  RADIOLOGY  ED MD interpretation:    Official radiology report(s): Dg Chest 2 View  Result Date: 02/19/2018 CLINICAL DATA:  Cough. EXAM: CHEST - 2 VIEW COMPARISON:  April 17, 2012 FINDINGS: The heart size and mediastinal contours are within normal limits. Both lungs are clear. The visualized skeletal structures are unremarkable. IMPRESSION: No active cardiopulmonary disease. Electronically Signed   By: Dorise Bullion III M.D   On: 02/19/2018 11:58   Dg Foot Complete Right  Result Date: 02/19/2018 Please see detailed radiograph report in office note.   ____________________________________________   PROCEDURES  Procedure(s) performed: None  Procedures  Critical Care performed: No  ____________________________________________   INITIAL  IMPRESSION / ASSESSMENT AND PLAN / ED COURSE  As part of my medical decision making, I reviewed the following data within the electronic MEDICAL RECORD NUMBER    Bronchospasm and cough secondary to chemical irritant.  Discussed x-ray findings with patient.  Patient given discharge care instruction.  Patient advised take medication as directed and follow-up PCP if condition persist.      ____________________________________________   FINAL CLINICAL IMPRESSION(S) / ED DIAGNOSES  Final diagnoses:  Cough due to bronchospasm     ED Discharge Orders        Ordered    brompheniramine-pseudoephedrine-DM 30-2-10 MG/5ML syrup  4 times daily PRN     02/19/18 1212       Note:  This document was prepared using Dragon voice recognition software and may include unintentional dictation errors.    Sable Feil, PA-C 02/19/18  [place], Randall An, MD 02/19/18 [phone]

## 2018-02-19 NOTE — ED Triage Notes (Signed)
Says was cleaning with bleach ( the new kind that can be used on colors.) yesterday and mixed it with arm and hammer washing detergent. Today feels bad.  Coughing up green phlegm.  Says he was congested prior.

## 2018-02-21 ENCOUNTER — Ambulatory Visit
Payer: Medicaid Other | Attending: Student in an Organized Health Care Education/Training Program | Admitting: Student in an Organized Health Care Education/Training Program

## 2018-02-21 ENCOUNTER — Other Ambulatory Visit: Payer: Self-pay

## 2018-02-21 ENCOUNTER — Encounter: Payer: Self-pay | Admitting: Student in an Organized Health Care Education/Training Program

## 2018-02-21 VITALS — BP 136/86 | HR 57 | Temp 98.1°F | Resp 18 | Ht 71.0 in | Wt 180.0 lb

## 2018-02-21 DIAGNOSIS — M542 Cervicalgia: Secondary | ICD-10-CM | POA: Diagnosis present

## 2018-02-21 DIAGNOSIS — M19011 Primary osteoarthritis, right shoulder: Secondary | ICD-10-CM

## 2018-02-21 DIAGNOSIS — M25511 Pain in right shoulder: Secondary | ICD-10-CM | POA: Diagnosis present

## 2018-02-21 DIAGNOSIS — Z9889 Other specified postprocedural states: Secondary | ICD-10-CM | POA: Diagnosis not present

## 2018-02-21 DIAGNOSIS — M75101 Unspecified rotator cuff tear or rupture of right shoulder, not specified as traumatic: Secondary | ICD-10-CM

## 2018-02-21 MED ORDER — DEXAMETHASONE SODIUM PHOSPHATE 10 MG/ML IJ SOLN
INTRAMUSCULAR | Status: AC
  Filled 2018-02-21: qty 1

## 2018-02-21 MED ORDER — BUPIVACAINE HCL (PF) 0.25 % IJ SOLN
INTRAMUSCULAR | Status: AC
  Filled 2018-02-21: qty 30

## 2018-02-21 NOTE — Progress Notes (Signed)
Patient's Name: Hayden Hall  MRN: [id number]  Referring Provider: Theotis Burrow*  DOB: 11-24-1955  PCP: Theotis Burrow, MD  DOS: 02/21/2018  Note by: Gillis Santa, MD  Service setting: Ambulatory outpatient  Specialty: Interventional Pain Management  Patient type: Established  Location: [facility] (AMB) Pain Management Facility  Visit type: Interventional Procedure   Primary Reason for Visit: Interventional Pain Management Treatment. CC: Shoulder Pain (right) and Neck Pain  Procedure:  Anesthesia, Analgesia, Anxiolysis:  Type: Diagnostic Glenohumeral Joint (shoulder) Injection          CPT: 20610      Primary Purpose: Diagnostic Region: Superior Shoulder Area Level:  Shoulder Target Area: Glenohumeral Joint (shoulder) Approach: Posterolateral approach. Laterality: Right Position: Sitting  Type: Local Anesthesia Indication(s): Analgesia         Route: Infiltration (Tucker/IM) IV Access: Declined Sedation: Declined  Local Anesthetic: Lidocaine 1%   Indications: 1. Primary osteoarthritis of right shoulder   2. History of rotator cuff surgery   3. Tear of right supraspinatus tendon    Pain Score: Pre-procedure: 7 /10 Post-procedure: 4 /10  Pre-op Assessment:  Hayden Hall is a 62 y.o. (year old), male patient, seen today for interventional treatment. He  has a past surgical history that includes Trigger finger release; Rotator cuff repair (Left); and Rotator cuff repair (Right). Hayden Hall has a current medication list which includes the following prescription(s): ascorbic acid, cyanocobalamin, dicyclomine, diphenhyd-hydrocort-nystatin, docosahexaenoic acid, levothyroxine, multi-vitamins, omeprazole, oxycodone, promethazine, tizanidine, valacyclovir, brompheniramine-pseudoephedrine-dm, sulfamethoxazole-trimethoprim, and tramadol. His primarily concern today is the Shoulder Pain (right) and Neck Pain  Initial Vital Signs:  Pulse/HCG Rate: (!) 46  Temp: 98.1 F (36.7  C) Resp: 16 BP: 117/84 SpO2: 100 %  BMI: Estimated body mass index is 25.1 kg/m as calculated from the following:   Height as of this encounter: 5\' 11"  (1.803 m).   Weight as of this encounter: 180 lb (81.6 kg).  Risk Assessment: Allergies: Reviewed. He is allergic to vesicare [solifenacin].  Allergy Precautions: None required Coagulopathies: Reviewed. None identified.  Blood-thinner therapy: None at this time Active Infection(s): Reviewed. None identified. Hayden Hall is afebrile  Site Confirmation: Hayden Hall was asked to confirm the procedure and laterality before marking the site Procedure checklist: Completed Consent: Before the procedure and under the influence of no sedative(s), amnesic(s), or anxiolytics, the patient was informed of the treatment options, risks and possible complications. To fulfill our ethical and legal obligations, as recommended by the American Medical Association's Code of Ethics, I have informed the patient of my clinical impression; the nature and purpose of the treatment or procedure; the risks, benefits, and possible complications of the intervention; the alternatives, including doing nothing; the risk(s) and benefit(s) of the alternative treatment(s) or procedure(s); and the risk(s) and benefit(s) of doing nothing. The patient was provided information about the general risks and possible complications associated with the procedure. These may include, but are not limited to: failure to achieve desired goals, infection, bleeding, organ or nerve damage, allergic reactions, paralysis, and death. In addition, the patient was informed of those risks and complications associated to the procedure, such as failure to decrease pain; infection; bleeding; organ or nerve damage with subsequent damage to sensory, motor, and/or autonomic systems, resulting in permanent pain, numbness, and/or weakness of one or several areas of the body; allergic reactions; (i.e.:  anaphylactic reaction); and/or death. Furthermore, the patient was informed of those risks and complications associated with the medications. These include, but are not limited to: allergic  reactions (i.e.: anaphylactic or anaphylactoid reaction(s)); adrenal axis suppression; blood sugar elevation that in diabetics may result in ketoacidosis or comma; water retention that in patients with history of congestive heart failure may result in shortness of breath, pulmonary edema, and decompensation with resultant heart failure; weight gain; swelling or edema; medication-induced neural toxicity; particulate matter embolism and blood vessel occlusion with resultant organ, and/or nervous system infarction; and/or aseptic necrosis of one or more joints. Finally, the patient was informed that Medicine is not an exact science; therefore, there is also the possibility of unforeseen or unpredictable risks and/or possible complications that may result in a catastrophic outcome. The patient indicated having understood very clearly. We have given the patient no guarantees and we have made no promises. Enough time was given to the patient to ask questions, all of which were answered to the patient's satisfaction. Hayden Hall has indicated that he wanted to continue with the procedure. Attestation: I, the ordering provider, attest that I have discussed with the patient the benefits, risks, side-effects, alternatives, likelihood of achieving goals, and potential problems during recovery for the procedure that I have provided informed consent. Date  Time: 02/21/2018  8:21 AM  Pre-Procedure Preparation:  Monitoring: As per clinic protocol. Respiration, ETCO2, SpO2, BP, heart rate and rhythm monitor placed and checked for adequate function Safety Precautions: Patient was assessed for positional comfort and pressure points before starting the procedure. Time-out: I initiated and conducted the "Time-out" before starting the  procedure, as per protocol. The patient was asked to participate by confirming the accuracy of the "Time Out" information. Verification of the correct person, site, and procedure were performed and confirmed by me, the nursing staff, and the patient. "Time-out" conducted as per Joint Commission's Universal Protocol (UP.01.01.01). Time: 0907  Description of Procedure Process:   Area Prepped: Entire shoulder Area Prepping solution: ChloraPrep (2% chlorhexidine gluconate and 70% isopropyl alcohol) Safety Precautions: Aspiration looking for blood return was conducted prior to all injections. At no point did we inject any substances, as a needle was being advanced. No attempts were made at seeking any paresthesias. Safe injection practices and needle disposal techniques used. Medications properly checked for expiration dates. SDV (single dose vial) medications used. Description of the Procedure: Protocol guidelines were followed. The patient was placed in position over the procedure table. The target area was identified and the area prepped in the usual manner. Skin & deeper tissues infiltrated with local anesthetic. Appropriate amount of time allowed to pass for local anesthetics to take effect. The procedure needles were then advanced to the target area. Proper needle placement secured. Negative aspiration confirmed. Solution injected in intermittent fashion, asking for systemic symptoms every 0.5cc of injectate. The needles were then removed and the area cleansed, making sure to leave some of the prepping solution back to take advantage of its long term bactericidal properties.  Vitals:   02/21/18 0829 02/21/18 0911  BP: 117/84 136/86  Pulse: (!) 46 (!) 57  Resp: 16 18  Temp: 98.1 F (36.7 C)   TempSrc: Oral   SpO2: 100% 100%  Weight: 180 lb (81.6 kg)   Height: 5\' 11"  (1.803 m)     Start Time: 0907 hrs. End Time: 0911 hrs. Materials:  Needle(s) Type: Regular needle Gauge: 22G Length:  3.5-in Medication(s): Please see orders for medications and dosing details. 4cc solution made of 3 cc of 0.25% bupivacaine, 1 cc of Decadron 10 mg/cc Imaging Guidance (Non-Spinal):  Type of Imaging Technique: Fluoroscopy Guidance (Non-Spinal) Indication(s): Assistance  in needle guidance and placement for procedures requiring needle placement in or near specific anatomical locations not easily accessible without such assistance. Exposure Time: Please see nurses notes. Contrast: None used. Fluoroscopic Guidance: I was personally present during the use of fluoroscopy. "Tunnel Vision Technique" used to obtain the best possible view of the target area. Parallax error corrected before commencing the procedure. "Direction-depth-direction" technique used to introduce the needle under continuous pulsed fluoroscopy. Once target was reached, antero-posterior, oblique, and lateral fluoroscopic projection used confirm needle placement in all planes. Images permanently stored in EMR. Interpretation: No contrast injected. I personally interpreted the imaging intraoperatively. Adequate needle placement confirmed in multiple planes. Permanent images saved into the patient's record.  Antibiotic Prophylaxis:   Anti-infectives (From admission, onward)   None     Indication(s): None identified  Post-operative Assessment:  Post-procedure Vital Signs:  Pulse/HCG Rate: (!) 57  Temp: 98.1 F (36.7 C) Resp: 18 BP: 136/86 SpO2: 100 %  EBL: None  Complications: No immediate post-treatment complications observed by team, or reported by patient.  Note: The patient tolerated the entire procedure well. A repeat set of vitals were taken after the procedure and the patient was kept under observation following institutional policy, for this type of procedure. Post-procedural neurological assessment was performed, showing return to baseline, prior to discharge. The patient was provided with post-procedure discharge  instructions, including a section on how to identify potential problems. Should any problems arise concerning this procedure, the patient was given instructions to immediately contact us, at any time, without hesitation. In any case, we plan to contact the patient by telephone for a follow-up status report regarding this interventional procedure.  Comments:  No additional relevant information.  Plan of Care   Imaging Orders  No imaging studies ordered today   Procedure Orders    No procedure(s) ordered today    Medications ordered for procedure: No orders of the defined types were placed in this encounter.  Medications administered: Amaris L. Flemings had no medications administered during this visit.  See the medical record for exact dosing, route, and time of administration.  New Prescriptions   No medications on file   Disposition: Discharge home  Discharge Date & Time: 02/21/2018; 0925 hrs.   Physician-requested Follow-up: No follow-ups on file.  Future Appointments  Date Time Provider [place]  03/22/2018 11:30 AM Gillis Santa, MD [facility] None  04/02/2018  8:15 AM Edrick Kins, DPM [facility]   Primary Care Physician: Theotis Burrow, MD Location: [facility] Note by: Gillis Santa, MD Date: 02/21/2018; Time: 9:54 AM  Disclaimer:  Medicine is not an exact science. The only guarantee in medicine is that nothing is guaranteed. It is important to note that the decision to proceed with this intervention was based on the information collected from the patient. The Data and conclusions were drawn from the patient's questionnaire, the interview, and the physical examination. Because the information was provided in large part by the patient, it cannot be guaranteed that it has not been purposely or unconsciously manipulated. Every effort has been made to obtain as much relevant data as possible for this evaluation. It is  important to note that the conclusions that lead to this procedure are derived in large part from the available data. Always take into account that the treatment will also be dependent on availability of resources and existing treatment guidelines, considered by other Pain Management Practitioners as being common knowledge and practice, at the time of  the intervention. For Medico-Legal purposes, it is also important to point out that variation in procedural techniques and pharmacological choices are the acceptable norm. The indications, contraindications, technique, and results of the above procedure should only be interpreted and judged by a Board-Certified Interventional Pain Specialist with extensive familiarity and expertise in the same exact procedure and technique.

## 2018-02-21 NOTE — Patient Instructions (Signed)

## 2018-02-21 NOTE — Progress Notes (Signed)
Safety precautions to be maintained throughout the outpatient stay will include: orient to surroundings, keep bed in low position, maintain call bell within reach at all times, provide assistance with transfer out of bed and ambulation.  

## 2018-02-21 NOTE — Progress Notes (Signed)
   Subjective: 62 year old male presenting today as a new patient with a chief complaint of right heel and arch pain that began several weeks ago. He reports associated pain that radiates across the dorsum of the foot. He also reports painful callus lesions to the lateral aspect of the plantar foot. He has been wearing OTC insoles and taking Meloxicam for treatment with some relief. Walking and standing for long periods of time increases the pain. Patient is here for further evaluation and treatment.   Past Medical History:  Diagnosis Date  . Anxiety   . Arthritis    RA   . BPH (benign prostatic hyperplasia)   . Bronchitis   . Chronic constipation   . Chronic pain   . Depression   . Hepatitis C   . Herpes   . History of pancreatitis   . History of seizure disorder   . Hypothyroidism   . Memory impairment   . Myoclonic jerking   . Neck injury   . Nocturia   . SOB (shortness of breath)   . Trigger finger   . Urinary frequency      Objective: Physical Exam General: The patient is alert and oriented x3 in no acute distress.  Dermatology: Skin is warm, dry and supple bilateral lower extremities. Negative for open lesions or macerations bilateral.   Vascular: Dorsalis Pedis and Posterior Tibial pulses palpable bilateral.  Capillary fill time is immediate to all digits.  Neurological: Epicritic and protective threshold intact bilateral.   Musculoskeletal: Tenderness to palpation to the plantar aspect of the right heel along the plantar fascia. All other joints range of motion within normal limits bilateral. Strength 5/5 in all groups bilateral.   Radiographic exam: Normal osseous mineralization. Joint spaces preserved. No fracture/dislocation/boney destruction. No other soft tissue abnormalities or radiopaque foreign bodies.   Assessment: 1. Plantar fasciitis right 2. Pain in right foot  Plan of Care:  1. Patient evaluated. Xrays reviewed.   2. Injection of 0.5cc Celestone  soluspan injected into the right plantar fascia  3. Continue taking Meloxicam daily. Patient already has prescription for it.  4. Continue wearing OTC insoles and good shoe gear.  5. Return to clinic in 6 weeks.    Edrick Kins, DPM [facility]  Dr. Edrick Kins, DPM    [address], [place] [address]                Office [phone]  Fax [phone]

## 2018-02-22 ENCOUNTER — Telehealth: Payer: Self-pay

## 2018-02-22 NOTE — Telephone Encounter (Signed)
Post procedure phone call.  Patient states he is doing good.  

## 2018-02-27 ENCOUNTER — Ambulatory Visit: Payer: Self-pay | Admitting: Podiatry

## 2018-03-22 ENCOUNTER — Other Ambulatory Visit: Payer: Self-pay

## 2018-03-22 ENCOUNTER — Ambulatory Visit
Payer: Medicaid Other | Attending: Student in an Organized Health Care Education/Training Program | Admitting: Student in an Organized Health Care Education/Training Program

## 2018-03-22 ENCOUNTER — Encounter: Payer: Self-pay | Admitting: Student in an Organized Health Care Education/Training Program

## 2018-03-22 VITALS — BP 124/91 | HR 51 | Temp 98.4°F | Resp 18 | Ht 71.0 in | Wt 170.0 lb

## 2018-03-22 DIAGNOSIS — M5010 Cervical disc disorder with radiculopathy, unspecified cervical region: Secondary | ICD-10-CM | POA: Diagnosis not present

## 2018-03-22 DIAGNOSIS — G894 Chronic pain syndrome: Secondary | ICD-10-CM | POA: Diagnosis not present

## 2018-03-22 DIAGNOSIS — M4722 Other spondylosis with radiculopathy, cervical region: Secondary | ICD-10-CM | POA: Diagnosis not present

## 2018-03-22 DIAGNOSIS — Z79899 Other long term (current) drug therapy: Secondary | ICD-10-CM | POA: Diagnosis not present

## 2018-03-22 DIAGNOSIS — F329 Major depressive disorder, single episode, unspecified: Secondary | ICD-10-CM | POA: Insufficient documentation

## 2018-03-22 DIAGNOSIS — M542 Cervicalgia: Secondary | ICD-10-CM

## 2018-03-22 DIAGNOSIS — B1920 Unspecified viral hepatitis C without hepatic coma: Secondary | ICD-10-CM | POA: Insufficient documentation

## 2018-03-22 DIAGNOSIS — Z87891 Personal history of nicotine dependence: Secondary | ICD-10-CM | POA: Diagnosis not present

## 2018-03-22 DIAGNOSIS — Z5181 Encounter for therapeutic drug level monitoring: Secondary | ICD-10-CM | POA: Diagnosis present

## 2018-03-22 DIAGNOSIS — Z0289 Encounter for other administrative examinations: Secondary | ICD-10-CM | POA: Diagnosis not present

## 2018-03-22 DIAGNOSIS — E039 Hypothyroidism, unspecified: Secondary | ICD-10-CM | POA: Insufficient documentation

## 2018-03-22 DIAGNOSIS — F419 Anxiety disorder, unspecified: Secondary | ICD-10-CM | POA: Insufficient documentation

## 2018-03-22 DIAGNOSIS — Z9889 Other specified postprocedural states: Secondary | ICD-10-CM

## 2018-03-22 DIAGNOSIS — M47812 Spondylosis without myelopathy or radiculopathy, cervical region: Secondary | ICD-10-CM | POA: Diagnosis not present

## 2018-03-22 DIAGNOSIS — M19011 Primary osteoarthritis, right shoulder: Secondary | ICD-10-CM | POA: Insufficient documentation

## 2018-03-22 DIAGNOSIS — M5412 Radiculopathy, cervical region: Secondary | ICD-10-CM

## 2018-03-22 DIAGNOSIS — M75101 Unspecified rotator cuff tear or rupture of right shoulder, not specified as traumatic: Secondary | ICD-10-CM | POA: Insufficient documentation

## 2018-03-22 DIAGNOSIS — M79671 Pain in right foot: Secondary | ICD-10-CM | POA: Insufficient documentation

## 2018-03-22 MED ORDER — OXYCODONE HCL 5 MG PO TABS: 5.0000 mg | ORAL_TABLET | Freq: Two times a day (BID) | ORAL | 0 refills | Status: AC | PRN

## 2018-03-22 MED ORDER — OXYCODONE HCL 5 MG PO TABS: 5.0000 mg | ORAL_TABLET | Freq: Two times a day (BID) | ORAL | 0 refills | Status: DC | PRN

## 2018-03-22 NOTE — Progress Notes (Signed)
Nursing Pain Medication Assessment:  Safety precautions to be maintained throughout the outpatient stay will include: orient to surroundings, keep bed in low position, maintain call bell within reach at all times, provide assistance with transfer out of bed and ambulation.  Medication Inspection Compliance: Pill count conducted under aseptic conditions, in front of the patient. Neither the pills nor the bottle was removed from the patient's sight at any time. Once count was completed pills were immediately returned to the patient in their original bottle.  Medication: Oxycodone IR Pill/Patch Count: 9 of 60 pills remain Pill/Patch Appearance: Markings consistent with prescribed medication Bottle Appearance: Standard pharmacy container. Clearly labeled. Filled Date:05/21/ 2019 Last Medication intake:  Yesterday

## 2018-03-22 NOTE — Progress Notes (Signed)
Patient's Name: Hayden Hall  MRN: [id number]  Referring Provider: Theotis Burrow*  DOB: 1956/07/31  PCP: Theotis Burrow, MD  DOS: 03/22/2018  Note by: Gillis Santa, MD  Service setting: Ambulatory outpatient  Specialty: Interventional Pain Management  Location: [facility] (AMB) Pain Management Facility    Patient type: Established   Primary Reason(s) for Visit: Encounter for prescription drug management. (Level of risk: moderate)  CC: Shoulder Pain (right) and Foot Pain (right)  HPI  Hayden Hall is a 62 y.o. year old, male patient, who comes today for a medication management evaluation. He has BPH with obstruction/lower urinary tract symptoms; Cervicalgia; History of rotator cuff surgery; Cervical radiculopathy; DDD (degenerative disc disease), cervical; Chronic pain syndrome; Primary osteoarthritis of right shoulder; and Osteoarthritis of spine with radiculopathy, cervical region on their problem list. His primarily concern today is the Shoulder Pain (right) and Foot Pain (right)  Pain Assessment: Location: Right Shoulder Radiating: shoulder pain radiates to neck and arm, right foot pain radiates into right lower leg and knee Onset: More than a month ago Duration: Chronic pain Quality: Radiating, Sharp, Dull Severity: 7 /10 (subjective, self-reported pain score)  Note: Reported level is inconsistent with clinical observations.                         When using our objective Pain Scale, levels between 6 and 10/10 are said to belong in an emergency room, as it progressively worsens from a 6/10, described as severely limiting, requiring emergency care not usually available at an outpatient pain management facility. At a 6/10 level, communication becomes difficult and requires great effort. Assistance to reach the emergency department may be required. Facial flushing and profuse sweating along with potentially dangerous increases in heart rate and blood pressure will be  evident. Effect on ADL:   Timing: Intermittent Modifying factors: rest BP: (!) 124/91  HR: (!) 51  Hayden Hall was last scheduled for an appointment on 02/21/2018 for medication management. During today's appointment we reviewed Hayden Hall chronic pain status, as well as his outpatient medication regimen.  Patient presents today for medication management.  Since his last clinic visit with me patient did have a right posterior shoulder steroid injection with me which he states was beneficial for his right shoulder pain.  Patient has been utilizing his medication appropriately and has limiting his intake to no more than twice a day as needed.  The patient  reports that he does not use drugs. His body mass index is 23.71 kg/m.  Further details on both, my assessment(s), as well as the proposed treatment plan, please see below.  Controlled Substance Pharmacotherapy Assessment REMS (Risk Evaluation and Mitigation Strategy)  Analgesic: Oxycodone 5 mg twice daily as needed, quantity 60/month MME/day: 15 mg/day.  Landis Martins, RN  03/22/2018 11:33 AM  Sign at close encounter Nursing Pain Medication Assessment:  Safety precautions to be maintained throughout the outpatient stay will include: orient to surroundings, keep bed in low position, maintain call bell within reach at all times, provide assistance with transfer out of bed and ambulation.  Medication Inspection Compliance: Pill count conducted under aseptic conditions, in front of the patient. Neither the pills nor the bottle was removed from the patient's sight at any time. Once count was completed pills were immediately returned to the patient in their original bottle.  Medication: Oxycodone IR Pill/Patch Count: 9 of 60 pills remain Pill/Patch Appearance: Markings consistent with prescribed medication Bottle Appearance: Standard  pharmacy container. Clearly labeled. Filled Date:05/21/ 2019 Last Medication intake:   Yesterday Pharmacokinetics: Liberation and absorption (onset of action): WNL Distribution (time to peak effect): WNL Metabolism and excretion (duration of action): WNL         Pharmacodynamics: Desired effects: Analgesia: Hayden Hall reports >50% benefit. Functional ability: Patient reports that medication allows him to accomplish basic ADLs Clinically meaningful improvement in function (CMIF): Sustained CMIF goals met Perceived effectiveness: Described as relatively effective, allowing for increase in activities of daily living (ADL) Undesirable effects: Side-effects or Adverse reactions: None reported Monitoring: [place] PMP: Online review of the past 54-monthperiod conducted. Compliant with practice rules and regulations Last UDS on record: Summary  Date Value Ref Range Status  01/04/2018 FINAL  Final    Comment:    ==================================================================== [facility] COMP DRUG ANALYSIS,UR ==================================================================== Test                             Result       Flag       Units Drug Present not Declared for Prescription Verification   Naproxen                       PRESENT      UNEXPECTED Drug Absent but Declared for Prescription Verification   Tramadol                       Not Detected UNEXPECTED ng/mg creat   Tizanidine                     Not Detected UNEXPECTED    Tizanidine, as indicated in the declared medication list, is not    always detected even when used as directed.   Diphenhydramine                Not Detected UNEXPECTED   Promethazine                   Not Detected UNEXPECTED ==================================================================== Test                      Result    Flag   Units      Ref Range   Creatinine              29               mg/dL      >=20 ==================================================================== Declared Medications:  The flagging and interpretation on this  report are based on the  following declared medications.  Unexpected results may arise from  inaccuracies in the declared medications.  **Note: The testing scope of this panel includes these medications:  Diphenhydramine  Promethazine (Phenergan)  Tramadol (Ultram)  **Note: The testing scope of this panel does not include small to  moderate amounts of these reported medications:  Tizanidine (Zanaflex)  **Note: The testing scope of this panel does not include following  reported medications:  Cyanocobalamin  Hydrocortisone  Levothyroxine  Multivitamin  Nystatin  Omeprazole  Sulfamethoxazole (Bactrim)  Trimethoprim (Bactrim)  Undefined Miscellaneous Drug  Valacyclovir (Valtrex)  Vitamin C ==================================================================== For clinical consultation, please call ([phone] ====================================================================    UDS interpretation: Compliant          Medication Assessment Form: Reviewed. Patient indicates being compliant with therapy Treatment compliance: Compliant Risk Assessment Profile: Aberrant behavior: See prior evaluations. None observed or detected today Comorbid  factors increasing risk of overdose: See prior notes. No additional risks detected today Risk of substance use disorder (SUD): Low Opioid Risk Tool - 02/21/18 0846      Family History of Substance Abuse   Alcohol  Positive Male    Illegal Drugs  Negative    Rx Drugs  Negative      Personal History of Substance Abuse   Alcohol  Negative    Illegal Drugs  Positive Male or Male    Rx Drugs  Positive Male or Male      Age   Age between 54-45 years   No      History of Preadolescent Sexual Abuse   History of Preadolescent Sexual Abuse  Negative or Male      Psychological Disease   Psychological Disease  Positive    ADD  Negative    Bipolar  Positive    Schizophrenia  Positive    Depression  Positive      Total Score    Opioid Risk Tool Scoring  13    Opioid Risk Interpretation  High Risk      ORT Scoring interpretation table:  Score <3 = Low Risk for SUD  Score between 4-7 = Moderate Risk for SUD  Score >8 = High Risk for Opioid Abuse   Risk Mitigation Strategies:  Patient Counseling: Covered Patient-Prescriber Agreement (PPA): Present and active  Notification to other healthcare providers: Done  Pharmacologic Plan: No change in therapy, at this time.             Laboratory Chemistry  Inflammation Markers (CRP: Acute Phase) (ESR: Chronic Phase) No results found for: CRP, ESRSEDRATE, LATICACIDVEN                       Rheumatology Markers No results found for: RF, ANA, LABURIC, URICUR, LYMEIGGIGMAB, LYMEABIGMQN, HLAB27                      Renal Function Markers Lab Results  Component Value Date   BUN 26 (H) 05/17/2017   CREATININE 0.86 05/17/2017   GFRAA >60 05/17/2017   GFRNONAA >60 05/17/2017                             Hepatic Function Markers Lab Results  Component Value Date   AST 30 05/17/2017   ALT 18 05/17/2017   ALBUMIN 4.5 05/17/2017   ALKPHOS 39 05/17/2017   LIPASE 107 (H) 05/17/2017                        Electrolytes Lab Results  Component Value Date   NA 138 05/17/2017   K 4.3 05/17/2017   CL 105 05/17/2017   CALCIUM 9.6 05/17/2017                        Neuropathy Markers No results found for: VITAMINB12, FOLATE, HGBA1C, HIV                      Bone Pathology Markers No results found for: VD25OH, VD125OH2TOT, G2877219, GB2010OF1, 25OHVITD1, 25OHVITD2, 25OHVITD3, TESTOFREE, TESTOSTERONE                       Coagulation Parameters Lab Results  Component Value Date   PLT 275 05/17/2017  Cardiovascular Markers Lab Results  Component Value Date   TROPONINI <0.03 12/03/2016   HGB 13.7 05/17/2017   HCT 40.7 05/17/2017                         CA Markers No results found for: CEA, CA125, LABCA2                      Note: Lab  results reviewed.  Recent Diagnostic Imaging Results  DG Foot Complete Right Please see detailed radiograph report in office note. DG Chest 2 View CLINICAL DATA:  Cough.  EXAM: CHEST - 2 VIEW  COMPARISON:  April 17, 2012  FINDINGS: The heart size and mediastinal contours are within normal limits. Both lungs are clear. The visualized skeletal structures are unremarkable.  IMPRESSION: No active cardiopulmonary disease.  Electronically Signed   By: Dorise Bullion III M.D   On: 02/19/2018 11:58  Complexity Note: Imaging results reviewed. Results shared with Mr. Mcgann, using Layman's terms.                         Meds   Current Outpatient Medications:  .  Ascorbic Acid (VITAMIN C PO), Take by mouth., Disp: , Rfl:  .  Cyanocobalamin (VITAMIN B 12 PO), Take by mouth., Disp: , Rfl:  .  dicyclomine (BENTYL) 10 MG capsule, Take 10 mg by mouth 4 (four) times daily -  before meals and at bedtime., Disp: , Rfl:  .  Diphenhyd-Hydrocort-Nystatin (FIRST-DUKES MOUTHWASH MT), Reported on 10/19/2015, Disp: , Rfl: 6 .  DOCOSAHEXAENOIC ACID PO, Take by mouth., Disp: , Rfl:  .  levothyroxine (SYNTHROID, LEVOTHROID) 25 MCG tablet, Take 25 mcg by mouth daily., Disp: , Rfl: 4 .  Multiple Vitamin (MULTI-VITAMINS) TABS, Take by mouth., Disp: , Rfl:  .  Omeprazole (RA OMEPRAZOLE) 20 MG TBEC, Take 20 mg by mouth. Reported on 10/19/2015, Disp: , Rfl:  .  oxyCODONE (OXY IR/ROXICODONE) 5 MG immediate release tablet, Take 1 tablet (5 mg total) by mouth every 12 (twelve) hours as needed for severe pain. For chronic pain To last for 30 days from fill date To fill on or after 03-23-2018, 04-21-2018., Disp: 60 tablet, Rfl: 0 .  promethazine (PHENERGAN) 25 MG tablet, Take 25 mg by mouth. Reported on 10/19/2015, Disp: , Rfl:  .  tiZANidine (ZANAFLEX) 4 MG tablet, Take 4 mg by mouth every 6 (six) hours as needed for muscle spasms., Disp: , Rfl:  .  traMADol (ULTRAM) 50 MG tablet, Take by mouth every 6 (six) hours as  needed. for pain, Disp: , Rfl: 3 .  valACYclovir (VALTREX) 500 MG tablet, Take 500 mg by mouth., Disp: , Rfl:  .  brompheniramine-pseudoephedrine-DM 30-2-10 MG/5ML syrup, Take 5 mLs by mouth 4 (four) times daily as needed. (Patient not taking: Reported on 02/21/2018), Disp: 120 mL, Rfl: 0 .  sulfamethoxazole-trimethoprim (BACTRIM DS,SEPTRA DS) 800-160 MG tablet, Take 1 tablet by mouth 2 (two) times daily. (Patient not taking: Reported on 11/08/2016), Disp: 20 tablet, Rfl: 0  ROS  Constitutional: Denies any fever or chills Gastrointestinal: No reported hemesis, hematochezia, vomiting, or acute GI distress Musculoskeletal: Denies any acute onset joint swelling, redness, loss of ROM, or weakness Neurological: No reported episodes of acute onset apraxia, aphasia, dysarthria, agnosia, amnesia, paralysis, loss of coordination, or loss of consciousness  Allergies  Mr. Schoenfelder is allergic to vesicare [solifenacin].  [place]  Drug: Mr. Bancroft  reports  that he does not use drugs. Alcohol:  reports that he does not drink alcohol. Tobacco:  reports that he has quit smoking. He has never used smokeless tobacco. Medical:  has a past medical history of Anxiety, Arthritis, BPH (benign prostatic hyperplasia), Bronchitis, Chronic constipation, Chronic pain, Depression, Hepatitis C, Herpes, History of pancreatitis, History of seizure disorder, Hypothyroidism, Memory impairment, Myoclonic jerking, Neck injury, Nocturia, SOB (shortness of breath), Trigger finger, and Urinary frequency. Surgical: Mr. Stief  has a past surgical history that includes Trigger finger release; Rotator cuff repair (Left); and Rotator cuff repair (Right). Family: family history includes Bladder Cancer in his mother; Breast cancer in his mother; Heart disease in his unknown relative; Kidney disease in his cousin and mother; Prostate cancer in his father.  Constitutional Exam  General appearance: Well nourished, well developed, and well  hydrated. In no apparent acute distress Vitals:   03/22/18 1126  BP: (!) 124/91  Pulse: (!) 51  Resp: 18  Temp: 98.4 F (36.9 C)  TempSrc: Oral  SpO2: 100%  Weight: 170 lb (77.1 kg)  Height: '5\' 11"'$  (1.803 m)   BMI Assessment: Estimated body mass index is 23.71 kg/m as calculated from the following:   Height as of this encounter: '5\' 11"'$  (1.803 m).   Weight as of this encounter: 170 lb (77.1 kg).  BMI interpretation table: BMI level Category Range association with higher incidence of chronic pain  <18 kg/m2 Underweight   18.5-24.9 kg/m2 Ideal body weight   25-29.9 kg/m2 Overweight Increased incidence by 20%  30-34.9 kg/m2 Obese (Class I) Increased incidence by 68%  35-39.9 kg/m2 Severe obesity (Class II) Increased incidence by 136%  >40 kg/m2 Extreme obesity (Class III) Increased incidence by 254%   Patient's current BMI Ideal Body weight  Body mass index is 23.71 kg/m. Ideal body weight: 75.3 kg (166 lb 0.1 oz) Adjusted ideal body weight: 76 kg (167 lb 9.7 oz)   BMI Readings from Last 4 Encounters:  03/22/18 23.71 kg/m  02/21/18 25.10 kg/m  02/19/18 25.10 kg/m  02/13/18 25.10 kg/m   Wt Readings from Last 4 Encounters:  03/22/18 170 lb (77.1 kg)  02/21/18 180 lb (81.6 kg)  02/19/18 180 lb (81.6 kg)  02/13/18 180 lb (81.6 kg)  Psych/Mental status: Alert, oriented x 3 (person, place, & time)       Eyes: PERLA Respiratory: No evidence of acute respiratory distress  Cervical Spine Area Exam  Skin & Axial Inspection: No masses, redness, edema, swelling, or associated skin lesions Alignment: Symmetrical Functional ROM: Diminished ROM      Stability: No instability detected Muscle Tone/Strength: Functionally intact. No obvious neuro-muscular anomalies detected. Sensory (Neurological): Articular pain pattern Palpation: Complains of area being tender to palpation Positive provocative maneuver for for bilateral occipital neuralgia  Upper Extremity (UE) Exam    Side:  Right upper extremity  Side: Left upper extremity  Skin & Extremity Inspection: Evidence of prior arthroplastic surgery  Skin & Extremity Inspection: Evidence of prior arthroplastic surgery  Functional ROM: Decreased ROM          Functional ROM: Decreased ROM          Muscle Tone/Strength: Functionally intact. No obvious neuro-muscular anomalies detected.  Muscle Tone/Strength: Functionally intact. No obvious neuro-muscular anomalies detected.  Sensory (Neurological): Arthropathic arthralgia          Sensory (Neurological): Arthropathic arthralgia          Palpation: No palpable anomalies  Palpation: No palpable anomalies              Provocative Test(s):  Phalen's test: deferred Tinel's test: deferred Apley's scratch test (touch opposite shoulder):  Action 1 (Across chest): Decreased ROM Action 2 (Overhead): Decreased ROM Action 3 (LB reach): Decreased ROM   Provocative Test(s):  Phalen's test: deferred Tinel's test: deferred Apley's scratch test (touch opposite shoulder):  Action 1 (Across chest): Decreased ROM Action 2 (Overhead): Decreased ROM Action 3 (LB reach): deferred    Thoracic Spine Area Exam  Skin & Axial Inspection: No masses, redness, or swelling Alignment: Symmetrical Functional ROM: Unrestricted ROM Stability: No instability detected Muscle Tone/Strength: Functionally intact. No obvious neuro-muscular anomalies detected. Sensory (Neurological): Unimpaired Muscle strength & Tone: No palpable anomalies  Lumbar Spine Area Exam  Skin & Axial Inspection: No masses, redness, or swelling Alignment: Symmetrical Functional ROM: Unrestricted ROM       Stability: No instability detected Muscle Tone/Strength: Functionally intact. No obvious neuro-muscular anomalies detected. Sensory (Neurological): Unimpaired Palpation: No palpable anomalies       Provocative Tests: Lumbar Hyperextension/rotation test: deferred today       Lumbar quadrant test (Kemp's test):  deferred today       Lumbar Lateral bending test: deferred today       Patrick's Maneuver: deferred today                   FABER test: deferred today       Thigh-thrust test: deferred today       S-I compression test: deferred today       S-I distraction test: deferred today        Gait & Posture Assessment  Ambulation: Unassisted Gait: Relatively normal for age and body habitus Posture: WNL   Lower Extremity Exam    Side: Right lower extremity  Side: Left lower extremity  Stability: No instability observed          Stability: No instability observed          Skin & Extremity Inspection: Skin color, temperature, and hair growth are WNL. No peripheral edema or cyanosis. No masses, redness, swelling, asymmetry, or associated skin lesions. No contractures.  Skin & Extremity Inspection: Skin color, temperature, and hair growth are WNL. No peripheral edema or cyanosis. No masses, redness, swelling, asymmetry, or associated skin lesions. No contractures.  Functional ROM: Unrestricted ROM                  Functional ROM: Unrestricted ROM                  Muscle Tone/Strength: Functionally intact. No obvious neuro-muscular anomalies detected.  Muscle Tone/Strength: Functionally intact. No obvious neuro-muscular anomalies detected.  Sensory (Neurological): Unimpaired  Sensory (Neurological): Unimpaired  Palpation: No palpable anomalies  Palpation: No palpable anomalies   Assessment  Primary Diagnosis & Pertinent Problem List: The primary encounter diagnosis was Chronic pain syndrome. Diagnoses of History of rotator cuff surgery, Tear of right supraspinatus tendon, Cervical facet joint syndrome - R C3,4,5,6,7, Osteoarthritis of spine with radiculopathy, cervical region, Cervicalgia, Pain management contract signed, and Cervical radiculopathy were also pertinent to this visit.  Status Diagnosis  Controlled Controlled Controlled 1. Chronic pain syndrome   2. History of rotator cuff surgery   3.  Tear of right supraspinatus tendon   4. Cervical facet joint syndrome - R C3,4,5,6,7   5. Osteoarthritis of spine with radiculopathy, cervical region   6. Cervicalgia   7. Pain management  contract signed   8. Cervical radiculopathy      General Recommendations: The pain condition that the patient suffers from is best treated with a multidisciplinary approach that involves an increase in physical activity to prevent de-conditioning and worsening of the pain cycle, as well as psychological counseling (formal and/or informal) to address the co-morbid psychological affects of pain. Treatment will often involve judicious use of pain medications and interventional procedures to decrease the pain, allowing the patient to participate in the physical activity that will ultimately produce long-lasting pain reductions. The goal of the multidisciplinary approach is to return the patient to a higher level of overall function and to restore their ability to perform activities of daily living.  62 year old male who presents with a chief complaint of neck pain, right heel pain, right shoulder pain. Patient has a history of 2 bilateral rotator cuff surgeries. He states that he is continuing to have severe neck pain that radiates down into his right shoulder and right arm. Patient also endorses issues with fine motor control and has had problems dropping objects in his left hand. He hastried various medications in the past including gabapentin which makes him sedated even at the lowest dose, meloxicam and other NSAIDs which upset his stomach, Tylenol which does nothing.Patient's cervical MRI is concerning for advanced cervical degenerative disc disease with right-sided facet arthropathy most pronounced at C3, 4, 5, 6. Patient also has right neuroforaminal impingement at C3/4, C5/6 along with left foraminal impingement at C4/5 and C5/6. Patient has mild spinal canal stenosis at C4/5 and C5/6. Regards to his right  shoulder MRI, patient has a large full-thickness retracted supraspinatus tendon tear with significant tendinopathy and moderate glenohumeral joint degenerative changes.  Patient presents today for medication management.  Since his last clinic visit with me patient did have a right posterior shoulder steroid injection with me which he states was beneficial for his right shoulder pain.  Patient has been utilizing his medication appropriately and has limiting his intake to no more than twice a day as needed.  Plan -UDS -Rx for Oxycodone X 2 months as below. ([place] PMP checked and appropriate) -Continue PRN Ibuprofen and APAP (no more than 1200 mg Ibuprofen daily, and no more than 2 g of APAP daily)  Interventional history: -Successful diagnostic right C3, 4, 5, 6, 7 cervical facet medial branch nerve block (01/22/18).  Can repeat in the future if return of pain and can consider radiofrequency ablation of these nerves.   Plan of Care  Pharmacotherapy (Medications Ordered): Meds ordered this encounter  Medications  . DISCONTD: oxyCODONE (OXY IR/ROXICODONE) 5 MG immediate release tablet    Sig: Take 1 tablet (5 mg total) by mouth every 12 (twelve) hours as needed for severe pain. For chronic pain To last for 30 days from fill date To fill on or after 03-23-2018, 04-21-2018.    Dispense:  60 tablet    Refill:  0    Do not place this medication, or any other prescription from our practice, on "Automatic Refill". Patient may have prescription filled one day early if pharmacy is closed on scheduled refill date.  To last for 30 days from fill date  . oxyCODONE (OXY IR/ROXICODONE) 5 MG immediate release tablet    Sig: Take 1 tablet (5 mg total) by mouth every 12 (twelve) hours as needed for severe pain. For chronic pain To last for 30 days from fill date To fill on or after 03-23-2018, 04-21-2018.    Dispense:  60  tablet    Refill:  0    Do not place this medication, or any other prescription from our  practice, on "Automatic Refill". Patient may have prescription filled one day early if pharmacy is closed on scheduled refill date.  To last for 30 days from fill date   Lab-work, procedure(s), and/or referral(s): Orders Placed This Encounter  Procedures  . ToxASSURE Select 13 (MW), Urine     Provider-requested follow-up: Return in about 8 weeks (around 05/17/2018) for Medication Management.  Time Note: Greater than 50% of the 25 minute(s) of face-to-face time spent with Mr. Schaible, was spent in counseling/coordination of care regarding: Mr. Wirt primary cause of pain, the treatment plan, treatment alternatives, medication side effects, the opioid analgesic risks and possible complications, the appropriate use of his medications, realistic expectations, the goals of pain management (increased in functionality), the medication agreement and the patient's responsibilities when it comes to controlled substances.  Future Appointments  Date Time Provider [place]  04/13/2018 10:15 AM Edrick Kins, DPM [facility]  05/15/2018  8:45 AM Gillis Santa, MD [facility] [facility] None    Primary Care Physician: Theotis Burrow, MD Location: [facility] Note by: Gillis Santa, M.D Date: 03/22/2018; Time: 12:07 PM  Patient Instructions  You were given 2 prescriptions for Oxycodone today.

## 2018-03-22 NOTE — Patient Instructions (Signed)
You were given 2 prescriptions for Oxycodone today. 

## 2018-03-27 ENCOUNTER — Telehealth: Payer: Self-pay | Admitting: Student in an Organized Health Care Education/Training Program

## 2018-03-27 LAB — TOXASSURE SELECT 13 (MW), URINE

## 2018-03-27 NOTE — Telephone Encounter (Signed)
Patient states pharmacy only will fill 7 days due to medicaid. Medicaid wants to know why he needs 30 day supply instead of 7. Please Prior Auth for patient and let him know when he can pick up meds

## 2018-03-27 NOTE — Telephone Encounter (Signed)
Voicemail left with patient that PA was sent on 03/25/18 for oxycodone

## 2018-04-02 ENCOUNTER — Ambulatory Visit: Payer: Medicaid Other | Admitting: Podiatry

## 2018-04-13 ENCOUNTER — Encounter

## 2018-04-13 ENCOUNTER — Ambulatory Visit: Payer: Medicaid Other | Admitting: Podiatry

## 2018-04-27 ENCOUNTER — Encounter: Payer: Self-pay | Admitting: Podiatry

## 2018-04-27 ENCOUNTER — Ambulatory Visit: Payer: Medicaid Other | Admitting: Podiatry

## 2018-04-27 DIAGNOSIS — M722 Plantar fascial fibromatosis: Secondary | ICD-10-CM | POA: Diagnosis not present

## 2018-04-27 DIAGNOSIS — B351 Tinea unguium: Secondary | ICD-10-CM | POA: Diagnosis not present

## 2018-04-27 DIAGNOSIS — Z79899 Other long term (current) drug therapy: Secondary | ICD-10-CM

## 2018-04-28 LAB — HEPATIC FUNCTION PANEL
ALBUMIN: 4.8 g/dL (ref 3.6–4.8)
ALT: 13 IU/L (ref 0–44)
AST: 18 IU/L (ref 0–40)
Alkaline Phosphatase: 46 IU/L (ref 39–117)
BILIRUBIN TOTAL: 0.4 mg/dL (ref 0.0–1.2)
BILIRUBIN, DIRECT: 0.09 mg/dL (ref 0.00–0.40)
TOTAL PROTEIN: 7.1 g/dL (ref 6.0–8.5)

## 2018-04-28 MED ORDER — TERBINAFINE HCL 250 MG PO TABS: 250.0000 mg | ORAL_TABLET | Freq: Every day | ORAL | 0 refills | Status: DC

## 2018-04-28 NOTE — Progress Notes (Signed)
   Subjective: 62 year old male presenting today for follow up evaluation of plantar fasciitis of the right foot. He states the last injection he received helped provide some relief temporarily. He states the pain has now returned and is located in the heel and radiates up the posterior leg. He is also concerned for nail fungus and is interested in treatment. He has used OTC fungal medication with no significant relief. Patient is here for further evaluation and treatment.   Past Medical History:  Diagnosis Date  . Anxiety   . Arthritis    RA   . BPH (benign prostatic hyperplasia)   . Bronchitis   . Chronic constipation   . Chronic pain   . Depression   . Hepatitis C   . Herpes   . History of pancreatitis   . History of seizure disorder   . Hypothyroidism   . Memory impairment   . Myoclonic jerking   . Neck injury   . Nocturia   . SOB (shortness of breath)   . Trigger finger   . Urinary frequency      Objective: Physical Exam General: The patient is alert and oriented x3 in no acute distress.  Dermatology: Hyperkeratotic, discolored, thickened, onychodystrophy of nails noted bilaterally. Skin is warm, dry and supple bilateral lower extremities. Negative for open lesions or macerations bilateral.   Vascular: Dorsalis Pedis and Posterior Tibial pulses palpable bilateral.  Capillary fill time is immediate to all digits.  Neurological: Epicritic and protective threshold intact bilateral.   Musculoskeletal: Tenderness to palpation to the plantar aspect of the right heel along the plantar fascia. All other joints range of motion within normal limits bilateral. Strength 5/5 in all groups bilateral.     Assessment: 1. Plantar fasciitis right 2. Onychomycosis nails 1-5 bilateral   Plan of Care:  1. Patient evaluated.   2. Injection of 0.5cc Celestone soluspan injected into the right plantar fascia  3. Continue taking OTC antiinflammatories. 4. Recommended good shoe gear.    5. Orders for liver function tests placed. If WNL, we will call in Lamisil 250 mg #90.  6. Return to clinic as needed.   Edrick Kins, DPM [facility]  Dr. Edrick Kins, DPM    [address], [place] [address]                Office [phone]  Fax [phone]

## 2018-05-14 ENCOUNTER — Telehealth: Payer: Self-pay | Admitting: Pain Medicine

## 2018-05-14 NOTE — Telephone Encounter (Addendum)
Patient states he pulled something in his left shoulder and it is not hurting like the right shoulder  This is dr Holley Raring patient, I entered dr. Dossie Arbour by mistake

## 2018-05-14 NOTE — Telephone Encounter (Signed)
Spoke with patient and he states that he was cleaning and lifting on Friday and has injured his Left shoulder and the pain is equal to his right shoulder for which he has already been treated.  Patient states he has an appt on tomorrow with Dr Holley Raring and would like for him to be prepared to spend some time with him and order xrays and possibly perform a procedure.  I told him that I would make Dr Holley Raring aware of this.

## 2018-05-15 ENCOUNTER — Encounter: Payer: Self-pay | Admitting: Student in an Organized Health Care Education/Training Program

## 2018-05-15 ENCOUNTER — Ambulatory Visit
Payer: Medicaid Other | Attending: Student in an Organized Health Care Education/Training Program | Admitting: Student in an Organized Health Care Education/Training Program

## 2018-05-15 VITALS — BP 112/84 | HR 56 | Temp 97.8°F | Resp 16 | Ht 71.0 in | Wt 180.0 lb

## 2018-05-15 DIAGNOSIS — B1920 Unspecified viral hepatitis C without hepatic coma: Secondary | ICD-10-CM | POA: Insufficient documentation

## 2018-05-15 DIAGNOSIS — N401 Enlarged prostate with lower urinary tract symptoms: Secondary | ICD-10-CM | POA: Insufficient documentation

## 2018-05-15 DIAGNOSIS — Z79890 Hormone replacement therapy (postmenopausal): Secondary | ICD-10-CM | POA: Diagnosis not present

## 2018-05-15 DIAGNOSIS — Z9889 Other specified postprocedural states: Secondary | ICD-10-CM | POA: Diagnosis not present

## 2018-05-15 DIAGNOSIS — F419 Anxiety disorder, unspecified: Secondary | ICD-10-CM | POA: Diagnosis not present

## 2018-05-15 DIAGNOSIS — F329 Major depressive disorder, single episode, unspecified: Secondary | ICD-10-CM | POA: Insufficient documentation

## 2018-05-15 DIAGNOSIS — M542 Cervicalgia: Secondary | ICD-10-CM | POA: Diagnosis not present

## 2018-05-15 DIAGNOSIS — M4722 Other spondylosis with radiculopathy, cervical region: Secondary | ICD-10-CM | POA: Diagnosis not present

## 2018-05-15 DIAGNOSIS — E039 Hypothyroidism, unspecified: Secondary | ICD-10-CM | POA: Insufficient documentation

## 2018-05-15 DIAGNOSIS — G894 Chronic pain syndrome: Secondary | ICD-10-CM | POA: Diagnosis not present

## 2018-05-15 DIAGNOSIS — Z87891 Personal history of nicotine dependence: Secondary | ICD-10-CM | POA: Insufficient documentation

## 2018-05-15 DIAGNOSIS — S46811A Strain of other muscles, fascia and tendons at shoulder and upper arm level, right arm, initial encounter: Secondary | ICD-10-CM | POA: Diagnosis not present

## 2018-05-15 DIAGNOSIS — X58XXXA Exposure to other specified factors, initial encounter: Secondary | ICD-10-CM | POA: Insufficient documentation

## 2018-05-15 DIAGNOSIS — M5010 Cervical disc disorder with radiculopathy, unspecified cervical region: Secondary | ICD-10-CM | POA: Insufficient documentation

## 2018-05-15 DIAGNOSIS — Z79899 Other long term (current) drug therapy: Secondary | ICD-10-CM | POA: Diagnosis not present

## 2018-05-15 DIAGNOSIS — M6530 Trigger finger, unspecified finger: Secondary | ICD-10-CM | POA: Insufficient documentation

## 2018-05-15 DIAGNOSIS — M75101 Unspecified rotator cuff tear or rupture of right shoulder, not specified as traumatic: Secondary | ICD-10-CM

## 2018-05-15 DIAGNOSIS — G40909 Epilepsy, unspecified, not intractable, without status epilepticus: Secondary | ICD-10-CM | POA: Diagnosis not present

## 2018-05-15 DIAGNOSIS — M25512 Pain in left shoulder: Secondary | ICD-10-CM | POA: Diagnosis present

## 2018-05-15 DIAGNOSIS — M25511 Pain in right shoulder: Secondary | ICD-10-CM | POA: Diagnosis present

## 2018-05-15 MED ORDER — KETOROLAC TROMETHAMINE 30 MG/ML IJ SOLN
30.0000 mg | Freq: Once | INTRAMUSCULAR | Status: AC
  Administered 2018-05-15: 30 mg via INTRAMUSCULAR
  Filled 2018-05-15: qty 1

## 2018-05-15 MED ORDER — ORPHENADRINE CITRATE 30 MG/ML IJ SOLN
30.0000 mg | Freq: Once | INTRAMUSCULAR | Status: AC
  Administered 2018-05-15: 30 mg via INTRAMUSCULAR
  Filled 2018-05-15: qty 2

## 2018-05-15 MED ORDER — OXYCODONE HCL 5 MG PO TABS
ORAL_TABLET | ORAL | 0 refills | Status: AC
Start: 2018-06-01 — End: 2018-07-01

## 2018-05-15 MED ORDER — OXYCODONE HCL 5 MG PO TABS
ORAL_TABLET | ORAL | 0 refills | Status: AC
Start: 2018-07-01 — End: 2018-07-30

## 2018-05-15 NOTE — Progress Notes (Signed)
Patient's Name: Hayden Hall  MRN: [id number]  Referring Provider: Theotis Burrow*  DOB: 08/26/1956  PCP: Theotis Burrow, MD  DOS: 05/15/2018  Note by: Gillis Santa, MD  Service setting: Ambulatory outpatient  Specialty: Interventional Pain Management  Location: [facility] (AMB) Pain Management Facility    Patient type: Established   Primary Reason(s) for Visit: Encounter for prescription drug management. (Level of risk: moderate)  CC: Shoulder Pain (bilateral ) and Neck Pain  HPI  Hayden Hall is a 62 y.o. year old, male patient, who comes today for a medication management evaluation. He has BPH with obstruction/lower urinary tract symptoms; Cervicalgia; History of rotator cuff surgery; Cervical radiculopathy; DDD (degenerative disc disease), cervical; Chronic pain syndrome; Primary osteoarthritis of right shoulder; and Osteoarthritis of spine with radiculopathy, cervical region on their problem list. His primarily concern today is the Shoulder Pain (bilateral ) and Neck Pain  Pain Assessment: Location: Left, Right Shoulder(neck) Radiating: shoulder pain goes up into neck and down both arms.  Onset: More than a month ago Duration: Chronic pain Quality: Discomfort, Jabbing, Constant(pulsating, miserable, bewildering ) Severity: 8 /10 (subjective, self-reported pain score)  Note: Reported level is inconsistent with clinical observations.                         When using our objective Pain Scale, levels between 6 and 10/10 are said to belong in an emergency room, as it progressively worsens from a 6/10, described as severely limiting, requiring emergency care not usually available at an outpatient pain management facility. At a 6/10 level, communication becomes difficult and requires great effort. Assistance to reach the emergency department may be required. Facial flushing and profuse sweating along with potentially dangerous increases in heart rate and blood pressure will be  evident. Effect on ADL: unable to do what he would like to do.  the left shoulder is the new pain and feels that he injured it last Friday when lifting a heavy object. Timing: Constant Modifying factors: rest, medications BP: 112/84  HR: (!) 56  Hayden Hall was last scheduled for an appointment on 03/27/2018 for medication management. During today's appointment we reviewed Mr. Copes chronic pain status, as well as his outpatient medication regimen.  Patient follows up today endorsing left shoulder pain that started last week when he was picking up an air compressor and pain into his car.  Patient is having pain with shoulder abduction left greater than right.  He states that he is occasionally having to take 3 tablets on days that his pain is severe  The patient  reports that he does not use drugs. His body mass index is 25.1 kg/m.  Further details on both, my assessment(s), as well as the proposed treatment plan, please see below.  Controlled Substance Pharmacotherapy Assessment REMS (Risk Evaluation and Mitigation Strategy)  Analgesic: Oxycodone 5 mg twice daily as needed MME/day: 15 mg/day.  Janett Billow, RN  05/15/2018  8:51 AM  Sign at close encounter Nursing Pain Medication Assessment:  Safety precautions to be maintained throughout the outpatient stay will include: orient to surroundings, keep bed in low position, maintain call bell within reach at all times, provide assistance with transfer out of bed and ambulation.  Medication Inspection Compliance: Pill count conducted under aseptic conditions, in front of the patient. Neither the pills nor the bottle was removed from the patient's sight at any time. Once count was completed pills were immediately returned to the patient in  their original bottle.  Medication: Oxycodone IR Pill/Patch Count: 38 of 60 pills remain Pill/Patch Appearance: Markings consistent with prescribed medication Bottle Appearance: Standard pharmacy  container. Clearly labeled. Filled Date: 07 / 31 / 2019 Last Medication intake:  Today   Pharmacokinetics: Liberation and absorption (onset of action): WNL Distribution (time to peak effect): WNL Metabolism and excretion (duration of action): WNL         Pharmacodynamics: Desired effects: Analgesia: Hayden Hall reports >50% benefit. Functional ability: Patient reports that medication allows him to accomplish basic ADLs Clinically meaningful improvement in function (CMIF): Sustained CMIF goals met Perceived effectiveness: Described as relatively effective, allowing for increase in activities of daily living (ADL) Undesirable effects: Side-effects or Adverse reactions: None reported Monitoring: [place] PMP: Online review of the past 4-monthperiod conducted. Compliant with practice rules and regulations Last UDS on record: Summary  Date Value Ref Range Status  03/22/2018 FINAL  Final    Comment:    ==================================================================== TOXASSURE SELECT 13 (MW) ==================================================================== Specimen Alert Note:  Urinary creatinine is low; ability to detect some drugs may be compromised.  Interpret results with caution. ==================================================================== Test                             Result       Flag       Units   NO DRUGS DETECTED. ==================================================================== Test                      Result    Flag   Units      Ref Range   Creatinine              13        L      mg/dL      >=20 ==================================================================== Declared Medications:  Medication list was not provided. ==================================================================== For clinical consultation, please call ([phone] ====================================================================    UDS interpretation: Compliant           Medication Assessment Form: Reviewed. Patient indicates being compliant with therapy Treatment compliance: Compliant Risk Assessment Profile: Aberrant behavior: See prior evaluations. None observed or detected today Comorbid factors increasing risk of overdose: See prior notes. No additional risks detected today Risk of substance use disorder (SUD): Low Opioid Risk Tool - 05/15/18 0852      Family History of Substance Abuse   Alcohol  Positive Male    Illegal Drugs  Positive Male    Rx Drugs  Positive Male or Male      Personal History of Substance Abuse   Alcohol  Positive Male or Male   when younger into his late 459's  Illegal Drugs  Positive Male or Male    Rx Drugs  Positive Male or Male      Psychological Disease   Psychological Disease  Negative    Depression  Negative      Total Score   Opioid Risk Tool Scoring  22    Opioid Risk Interpretation  High Risk      ORT Scoring interpretation table:  Score <3 = Low Risk for SUD  Score between 4-7 = Moderate Risk for SUD  Score >8 = High Risk for Opioid Abuse   Risk Mitigation Strategies:  Patient Counseling: Covered Patient-Prescriber Agreement (PPA): Present and active  Notification to other healthcare providers: Done  Pharmacologic Plan: Increase monthly quantity to 75/month so that patient can take a third dose  on days when he has severe shoulder pain.             Laboratory Chemistry  Inflammation Markers (CRP: Acute Phase) (ESR: Chronic Phase) No results found for: CRP, ESRSEDRATE, LATICACIDVEN                       Rheumatology Markers No results found for: RF, ANA, LABURIC, URICUR, LYMEIGGIGMAB, LYMEABIGMQN, HLAB27                      Renal Function Markers Lab Results  Component Value Date   BUN 26 (H) 05/17/2017   CREATININE 0.86 05/17/2017   GFRAA >60 05/17/2017   GFRNONAA >60 05/17/2017                             Hepatic Function Markers Lab Results  Component Value Date   AST 18  04/27/2018   ALT 13 04/27/2018   ALBUMIN 4.8 04/27/2018   ALKPHOS 46 04/27/2018   LIPASE 107 (H) 05/17/2017                        Electrolytes Lab Results  Component Value Date   NA 138 05/17/2017   K 4.3 05/17/2017   CL 105 05/17/2017   CALCIUM 9.6 05/17/2017                        Neuropathy Markers No results found for: VITAMINB12, FOLATE, HGBA1C, HIV                      Bone Pathology Markers No results found for: VD25OH, VD125OH2TOT, IE3329JJ8, AC1660YT0, 25OHVITD1, 25OHVITD2, 25OHVITD3, TESTOFREE, TESTOSTERONE                       Coagulation Parameters Lab Results  Component Value Date   PLT 275 05/17/2017                        Cardiovascular Markers Lab Results  Component Value Date   TROPONINI <0.03 12/03/2016   HGB 13.7 05/17/2017   HCT 40.7 05/17/2017                         CA Markers No results found for: CEA, CA125, LABCA2                      Note: Lab results reviewed.  Recent Diagnostic Imaging Results  DG Foot Complete Right Please see detailed radiograph report in office note. DG Chest 2 View CLINICAL DATA:  Cough.  EXAM: CHEST - 2 VIEW  COMPARISON:  April 17, 2012  FINDINGS: The heart size and mediastinal contours are within normal limits. Both lungs are clear. The visualized skeletal structures are unremarkable.  IMPRESSION: No active cardiopulmonary disease.  Electronically Signed   By: Dorise Bullion III M.D   On: 02/19/2018 11:58  Complexity Note: Imaging results reviewed. Results shared with Hayden Hall, using Layman's terms.                         Meds   Current Outpatient Medications:  .  Bilberry 1000 MG CAPS, Take 1,000 mg by mouth 2 (two) times daily., Disp: , Rfl:  .  Cholecalciferol (VITAMIN D3) 2000 units  TABS, Take 2,000 Units by mouth 2 (two) times daily., Disp: , Rfl:  .  Cyanocobalamin (VITAMIN B 12 PO), Take by mouth., Disp: , Rfl:  .  dicyclomine (BENTYL) 10 MG capsule, Take 10 mg by mouth 4 (four)  times daily -  before meals and at bedtime., Disp: , Rfl:  .  Ginger, Zingiber officinalis, (GINGER ROOT) 550 MG CAPS, Take 550 mg by mouth 2 (two) times daily., Disp: , Rfl:  .  levothyroxine (SYNTHROID, LEVOTHROID) 25 MCG tablet, Take 25 mcg by mouth daily., Disp: , Rfl: 4 .  milk thistle 175 MG tablet, Take 175 mg by mouth 2 (two) times daily., Disp: , Rfl:  .  Multiple Vitamin (MULTI-VITAMINS) TABS, Take by mouth., Disp: , Rfl:  .  Omeprazole (RA OMEPRAZOLE) 20 MG TBEC, Take 20 mg by mouth. Reported on 10/19/2015, Disp: , Rfl:  .  [START ON 06/01/2018] oxyCODONE (OXY IR/ROXICODONE) 5 MG immediate release tablet, 5 mg BID-TID prn moderate to severe shoulder pain. Max 75/month, Disp: 75 tablet, Rfl: 0 .  terbinafine (LAMISIL) 250 MG tablet, Take 1 tablet (250 mg total) by mouth daily., Disp: 90 tablet, Rfl: 0 .  tiZANidine (ZANAFLEX) 4 MG tablet, Take 4 mg by mouth every 6 (six) hours as needed for muscle spasms., Disp: , Rfl:  .  Turmeric (CURCUMIN 95) 500 MG CAPS, Take 400 mg by mouth 2 (two) times daily., Disp: , Rfl:  .  valACYclovir (VALTREX) 500 MG tablet, Take 500 mg by mouth., Disp: , Rfl:  .  zinc gluconate 50 MG tablet, Take 50 mg by mouth daily., Disp: , Rfl:  .  Ascorbic Acid (VITAMIN C PO), Take by mouth., Disp: , Rfl:  .  Diphenhyd-Hydrocort-Nystatin (FIRST-DUKES MOUTHWASH MT), Reported on 10/19/2015, Disp: , Rfl: 6 .  DOCOSAHEXAENOIC ACID PO, Take by mouth., Disp: , Rfl:  .  [START ON 07/01/2018] oxyCODONE (OXY IR/ROXICODONE) 5 MG immediate release tablet, 5 mg BID- TID prn moderate to severe shoulder pain. Max 75/month, Disp: 75 tablet, Rfl: 0 .  promethazine (PHENERGAN) 25 MG tablet, Take 25 mg by mouth. Reported on 10/19/2015, Disp: , Rfl:  .  traMADol (ULTRAM) 50 MG tablet, Take by mouth every 6 (six) hours as needed. for pain, Disp: , Rfl: 3  ROS  Constitutional: Denies any fever or chills Gastrointestinal: No reported hemesis, hematochezia, vomiting, or acute GI  distress Musculoskeletal: Denies any acute onset joint swelling, redness, loss of ROM, or weakness Neurological: No reported episodes of acute onset apraxia, aphasia, dysarthria, agnosia, amnesia, paralysis, loss of coordination, or loss of consciousness  Allergies  Hayden Hall is allergic to vesicare [solifenacin].  [place]  Drug: Hayden Hall  reports that he does not use drugs. Alcohol:  reports that he does not drink alcohol. Tobacco:  reports that he has quit smoking. He has never used smokeless tobacco. Medical:  has a past medical history of Anxiety, Arthritis, BPH (benign prostatic hyperplasia), Bronchitis, Chronic constipation, Chronic pain, Depression, Hepatitis C, Herpes, History of pancreatitis, History of seizure disorder, Hypothyroidism, Memory impairment, Myoclonic jerking, Neck injury, Nocturia, SOB (shortness of breath), Trigger finger, and Urinary frequency. Surgical: Hayden Hall  has a past surgical history that includes Trigger finger release; Rotator cuff repair (Left); and Rotator cuff repair (Right). Family: family history includes Bladder Cancer in his mother; Breast cancer in his mother; Heart disease in his unknown relative; Kidney disease in his cousin and mother; Prostate cancer in his father.  Constitutional Exam  General appearance: Well nourished, well  developed, and well hydrated. In no apparent acute distress Vitals:   05/15/18 0838  BP: 112/84  Pulse: (!) 56  Resp: 16  Temp: 97.8 F (36.6 C)  TempSrc: Oral  SpO2: 100%  Weight: 180 lb (81.6 kg)  Height: '5\' 11"'$  (1.803 m)   BMI Assessment: Estimated body mass index is 25.1 kg/m as calculated from the following:   Height as of this encounter: '5\' 11"'$  (1.803 m).   Weight as of this encounter: 180 lb (81.6 kg).  BMI interpretation table: BMI level Category Range association with higher incidence of chronic pain  <18 kg/m2 Underweight   18.5-24.9 kg/m2 Ideal body weight   25-29.9 kg/m2 Overweight Increased  incidence by 20%  30-34.9 kg/m2 Obese (Class I) Increased incidence by 68%  35-39.9 kg/m2 Severe obesity (Class II) Increased incidence by 136%  >40 kg/m2 Extreme obesity (Class III) Increased incidence by 254%   Patient's current BMI Ideal Body weight  Body mass index is 25.1 kg/m. Ideal body weight: 75.3 kg (166 lb 0.1 oz) Adjusted ideal body weight: 77.8 kg (171 lb 9.7 oz)   BMI Readings from Last 4 Encounters:  05/15/18 25.10 kg/m  03/22/18 23.71 kg/m  02/21/18 25.10 kg/m  02/19/18 25.10 kg/m   Wt Readings from Last 4 Encounters:  05/15/18 180 lb (81.6 kg)  03/22/18 170 lb (77.1 kg)  02/21/18 180 lb (81.6 kg)  02/19/18 180 lb (81.6 kg)  Psych/Mental status: Alert, oriented x 3 (person, place, & time)       Eyes: PERLA Respiratory: No evidence of acute respiratory distress  Cervical Spine Area Exam  Skin & Axial Inspection: No masses, redness, edema, swelling, or associated skin lesions Alignment: Symmetrical Functional ROM: Decreased ROM      Stability: No instability detected Muscle Tone/Strength: Functionally intact. No obvious neuro-muscular anomalies detected. Sensory (Neurological): Musculoskeletal pain pattern Palpation: Complains of area being tender to palpation Positive provocative maneuver for for cervical facet disease  Upper Extremity (UE) Exam    Side: Right upper extremity  Side: Left upper extremity  Skin & Extremity Inspection: Skin color, temperature, and hair growth are WNL. No peripheral edema or cyanosis. No masses, redness, swelling, asymmetry, or associated skin lesions. No contractures.  Skin & Extremity Inspection: Skin color, temperature, and hair growth are WNL. No peripheral edema or cyanosis. No masses, redness, swelling, asymmetry, or associated skin lesions. No contractures.  Functional ROM: Decreased ROM for shoulder  Functional ROM: Decreased ROM for shoulder  Muscle Tone/Strength: Functionally intact. No obvious neuro-muscular anomalies  detected.  Muscle Tone/Strength: Functionally intact. No obvious neuro-muscular anomalies detected.  Sensory (Neurological): Musculoskeletal pain pattern          Sensory (Neurological): Musculoskeletal pain pattern          Palpation: No palpable anomalies              Palpation: No palpable anomalies              Provocative Test(s):  Phalen's test: deferred Tinel's test: deferred Apley's scratch test (touch opposite shoulder):  Action 1 (Across chest): Decreased ROM Action 2 (Overhead): Decreased ROM Action 3 (LB reach): Decreased ROM   Provocative Test(s):  Phalen's test: deferred Tinel's test: deferred Apley's scratch test (touch opposite shoulder):  Action 1 (Across chest): Decreased ROM Action 2 (Overhead): Decreased ROM Action 3 (LB reach): Decreased ROM    Thoracic Spine Area Exam  Skin & Axial Inspection: No masses, redness, or swelling Alignment: Symmetrical Functional ROM: Unrestricted ROM Stability: No  instability detected Muscle Tone/Strength: Functionally intact. No obvious neuro-muscular anomalies detected. Sensory (Neurological): Unimpaired Muscle strength & Tone: No palpable anomalies  Lumbar Spine Area Exam  Skin & Axial Inspection: No masses, redness, or swelling Alignment: Symmetrical Functional ROM: Unrestricted ROM       Stability: No instability detected Muscle Tone/Strength: Functionally intact. No obvious neuro-muscular anomalies detected. Sensory (Neurological): Unimpaired Palpation: No palpable anomalies       Provocative Tests: Hyperextension/rotation test: deferred today       Lumbar quadrant test (Kemp's test): deferred today       Lateral bending test: deferred today       Patrick's Maneuver: deferred today                   FABER test: deferred today                   S-I anterior distraction/compression test: deferred today         S-I lateral compression test: deferred today         S-I Thigh-thrust test: deferred today         S-I  Gaenslen's test: deferred today          Gait & Posture Assessment  Ambulation: Unassisted Gait: Relatively normal for age and body habitus Posture: WNL   Lower Extremity Exam    Side: Right lower extremity  Side: Left lower extremity  Stability: No instability observed          Stability: No instability observed          Skin & Extremity Inspection: Skin color, temperature, and hair growth are WNL. No peripheral edema or cyanosis. No masses, redness, swelling, asymmetry, or associated skin lesions. No contractures.  Skin & Extremity Inspection: Skin color, temperature, and hair growth are WNL. No peripheral edema or cyanosis. No masses, redness, swelling, asymmetry, or associated skin lesions. No contractures.  Functional ROM: Unrestricted ROM                  Functional ROM: Unrestricted ROM                  Muscle Tone/Strength: Functionally intact. No obvious neuro-muscular anomalies detected.  Muscle Tone/Strength: Functionally intact. No obvious neuro-muscular anomalies detected.  Sensory (Neurological): Unimpaired  Sensory (Neurological): Unimpaired  Palpation: No palpable anomalies  Palpation: No palpable anomalies   Assessment  Primary Diagnosis & Pertinent Problem List: The primary encounter diagnosis was History of rotator cuff surgery. Diagnoses of Tear of right supraspinatus tendon, Chronic pain syndrome, Cervicalgia, and Neck pain were also pertinent to this visit.  Status Diagnosis  Controlled Controlled Controlled 1. History of rotator cuff surgery   2. Tear of right supraspinatus tendon   3. Chronic pain syndrome   4. Cervicalgia   5. Neck pain      General Recommendations: The pain condition that the patient suffers from is best treated with a multidisciplinary approach that involves an increase in physical activity to prevent de-conditioning and worsening of the pain cycle, as well as psychological counseling (formal and/or informal) to address the co-morbid  psychological affects of pain. Treatment will often involve judicious use of pain medications and interventional procedures to decrease the pain, allowing the patient to participate in the physical activity that will ultimately produce long-lasting pain reductions. The goal of the multidisciplinary approach is to return the patient to a higher level of overall function and to restore their ability to perform activities of daily living.  62 year old male who presents with a chief complaint of neck pain, right heel pain, right shoulder pain. Patient has a history of 2 bilateral rotator cuff surgeries.  He hastried various medications in the past including gabapentin which makes him sedated even at the lowest dose, meloxicam and other NSAIDs which upset his stomach, Tylenol which does nothing.Patient's cervical MRI is concerning for advanced cervical degenerative disc disease with right-sided facet arthropathy most pronounced at C3, 4, 5, 6. Patient also has right neuroforaminal impingement at C3/4, C5/6 along with left foraminal impingement at C4/5 and C5/6. Patient has mild spinal canal stenosis at C4/5 and C5/6. Regards to his right shoulder MRI, patient has a large full-thickness retracted supraspinatus tendon tear with significant tendinopathy and moderate glenohumeral joint degenerative changes.  Patient follows up today endorsing left shoulder pain that started last week when he was picking up an air compressor and pain into his car.  Patient is having pain with shoulder abduction left greater than right.  He states that he is occasionally having to take 3 tablets on days that his pain is severe.  Interventional history: -Successful diagnostic right C3, 4, 5, 6, 7 cervical facet medial branch nerve block (01/22/18).Can repeat in the future if return of pain and can consider radiofrequency ablation of these nerves. -R shoulder joint injection on 5/22 which was helpful  Plan: -Refill oxycodone,  increase quantity from #60 to #75 -Intramuscular Norflex and Toradol for acute on chronic left shoulder pain -Consider repeating cervical facets vs shoulder joint injection   Plan of Care  Pharmacotherapy (Medications Ordered): Meds ordered this encounter  Medications  . oxyCODONE (OXY IR/ROXICODONE) 5 MG immediate release tablet    Sig: 5 mg BID-TID prn moderate to severe shoulder pain. Max 75/month    Dispense:  75 tablet    Refill:  0  . oxyCODONE (OXY IR/ROXICODONE) 5 MG immediate release tablet    Sig: 5 mg BID- TID prn moderate to severe shoulder pain. Max 75/month    Dispense:  75 tablet    Refill:  0    Do not place this medication, or any other prescription from our practice, on "Automatic Refill". Patient may have prescription filled one day early if pharmacy is closed on scheduled refill date.  . orphenadrine (NORFLEX) injection 30 mg  . ketorolac (TORADOL) 30 MG/ML injection 30 mg    Time Note: Greater than 50% of the 25 minute(s) of face-to-face time spent with Hayden Hall, was spent in counseling/coordination of care regarding: Hayden Hall primary cause of pain, the treatment plan, treatment alternatives, medication side effects, the opioid analgesic risks and possible complications, the appropriate use of his medications, realistic expectations, the goals of pain management (increased in functionality), the medication agreement and the patient's responsibilities when it comes to controlled substances.  Provider-requested follow-up: Return in about 9 weeks (around 07/17/2018) for Medication Management.  Future Appointments  Date Time Provider Linda  07/12/2018  9:30 AM Gillis Santa, MD [facility] None    Primary Care Physician: Theotis Burrow, MD Location: [facility] Note by: Gillis Santa, M.D Date: 05/15/2018; Time: 11:42 AM  Patient Instructions  Oxycodone 5 mg x 2 months to fill on 06/01/18 and 07/01/18  escribed to your pharmacy   Toradol 30 mg IM injection Norflex 30 mg IM injection

## 2018-05-15 NOTE — Patient Instructions (Signed)
Oxycodone 5 mg x 2 months to fill on 06/01/18 and 07/01/18 escribed to your pharmacy   Toradol 30 mg IM injection Norflex 30 mg IM injection

## 2018-05-15 NOTE — Progress Notes (Signed)
Nursing Pain Medication Assessment:  Safety precautions to be maintained throughout the outpatient stay will include: orient to surroundings, keep bed in low position, maintain call bell within reach at all times, provide assistance with transfer out of bed and ambulation.  Medication Inspection Compliance: Pill count conducted under aseptic conditions, in front of the patient. Neither the pills nor the bottle was removed from the patient's sight at any time. Once count was completed pills were immediately returned to the patient in their original bottle.  Medication: Oxycodone IR Pill/Patch Count: 38 of 60 pills remain Pill/Patch Appearance: Markings consistent with prescribed medication Bottle Appearance: Standard pharmacy container. Clearly labeled. Filled Date: 07 / 31 / 2019 Last Medication intake:  Today

## 2018-05-21 ENCOUNTER — Telehealth: Payer: Self-pay | Admitting: *Deleted

## 2018-05-21 NOTE — Telephone Encounter (Signed)
Patient c/o left shoulder pain.  Patient has had surgery on both shoulders.  Feels that he needs to have xray, states it feels as if something has "come loose" in the left shoulder.  Pain has been increased for approx 2 weeks when he lifted something heavy. Told patient I would give info to Dr Holley Raring and let him know what he recommends.

## 2018-05-22 ENCOUNTER — Other Ambulatory Visit: Payer: Self-pay | Admitting: Student in an Organized Health Care Education/Training Program

## 2018-05-22 ENCOUNTER — Ambulatory Visit
Admission: RE | Admit: 2018-05-22 | Discharge: 2018-05-22 | Disposition: A | Discharge status: Home / Self Care | Payer: Medicaid Other | Source: Ambulatory Visit | Attending: Student in an Organized Health Care Education/Training Program | Admitting: Student in an Organized Health Care Education/Training Program

## 2018-05-22 DIAGNOSIS — M75101 Unspecified rotator cuff tear or rupture of right shoulder, not specified as traumatic: Secondary | ICD-10-CM

## 2018-05-22 DIAGNOSIS — M25512 Pain in left shoulder: Secondary | ICD-10-CM | POA: Insufficient documentation

## 2018-05-22 DIAGNOSIS — Z9889 Other specified postprocedural states: Secondary | ICD-10-CM

## 2018-05-22 DIAGNOSIS — M19011 Primary osteoarthritis, right shoulder: Secondary | ICD-10-CM | POA: Insufficient documentation

## 2018-05-22 DIAGNOSIS — M25511 Pain in right shoulder: Secondary | ICD-10-CM | POA: Diagnosis not present

## 2018-05-22 DIAGNOSIS — G894 Chronic pain syndrome: Secondary | ICD-10-CM

## 2018-05-22 NOTE — Telephone Encounter (Signed)
Spoke with patient and he is going to have xray done of left shoulder.  Patient also would like to see Dr Holley Raring for an injection to see if that would help with pain.  Instructed patient to have xray performed and let Dr Holley Raring review to see if shoulder injection would be appropriate.

## 2018-05-22 NOTE — Treatment Plan (Signed)
Patient complaining of worsening shoulder pain, order placed for bilateral shoulder xray  Orders Placed This Encounter  Procedures  . DG Shoulder Left    Standing Status:   Future    Standing Expiration Date:   07/22/2019    Order Specific Question:   Reason for Exam (SYMPTOM  OR DIAGNOSIS REQUIRED)    Answer:   Left shoulder pain    Order Specific Question:   Preferred imaging location?    Answer:   [facility]    Order Specific Question:   Call Results- Best Contact Number?    Answer:   [phone]  . DG Shoulder Right    Standing Status:   Future    Standing Expiration Date:   07/22/2019    Order Specific Question:   Reason for Exam (SYMPTOM  OR DIAGNOSIS REQUIRED)    Answer:   Right shoulder pain    Order Specific Question:   Preferred imaging location?    Answer:   [facility]    Order Specific Question:   Call Results- Best Contact Number?    Answer:   [phone]

## 2018-05-28 ENCOUNTER — Ambulatory Visit
Admission: RE | Admit: 2018-05-28 | Discharge: 2018-05-28 | Disposition: A | Discharge status: Home / Self Care | Payer: Medicaid Other | Source: Ambulatory Visit | Attending: Student in an Organized Health Care Education/Training Program | Admitting: Student in an Organized Health Care Education/Training Program

## 2018-05-28 ENCOUNTER — Ambulatory Visit: Payer: Medicaid Other | Admitting: Student in an Organized Health Care Education/Training Program

## 2018-05-28 ENCOUNTER — Encounter: Payer: Self-pay | Admitting: Student in an Organized Health Care Education/Training Program

## 2018-05-28 VITALS — BP 139/91 | HR 61 | Temp 98.0°F | Resp 16 | Ht 71.0 in | Wt 172.6 lb

## 2018-05-28 DIAGNOSIS — Z9889 Other specified postprocedural states: Secondary | ICD-10-CM

## 2018-05-28 DIAGNOSIS — M19011 Primary osteoarthritis, right shoulder: Secondary | ICD-10-CM | POA: Insufficient documentation

## 2018-05-28 DIAGNOSIS — G894 Chronic pain syndrome: Secondary | ICD-10-CM | POA: Insufficient documentation

## 2018-05-28 DIAGNOSIS — M25512 Pain in left shoulder: Secondary | ICD-10-CM | POA: Insufficient documentation

## 2018-05-28 MED ORDER — IOPAMIDOL (ISOVUE-M 200) INJECTION 41%
INTRAMUSCULAR | Status: AC
  Filled 2018-05-28: qty 10

## 2018-05-28 MED ORDER — ROPIVACAINE HCL 2 MG/ML IJ SOLN
10.0000 mL | Freq: Once | INTRAMUSCULAR | Status: AC
  Administered 2018-05-28: 10 mL
  Filled 2018-05-28: qty 10

## 2018-05-28 MED ORDER — DEXAMETHASONE SODIUM PHOSPHATE 10 MG/ML IJ SOLN
10.0000 mg | Freq: Once | INTRAMUSCULAR | Status: AC
  Administered 2018-05-28: 10 mg
  Filled 2018-05-28: qty 1

## 2018-05-28 MED ORDER — IOPAMIDOL (ISOVUE-M 200) INJECTION 41%
10.0000 mL | Freq: Once | INTRAMUSCULAR | Status: AC
  Administered 2018-05-28: 10 mL via EPIDURAL

## 2018-05-28 MED ORDER — FENTANYL CITRATE (PF) 100 MCG/2ML IJ SOLN
25.0000 ug | INTRAMUSCULAR | Status: DC | PRN
  Administered 2018-05-28: 50 ug via INTRAVENOUS

## 2018-05-28 MED ORDER — LIDOCAINE HCL 2 % IJ SOLN
20.0000 mL | Freq: Once | INTRAMUSCULAR | Status: AC
  Administered 2018-05-28: 400 mg
  Filled 2018-05-28: qty 40

## 2018-05-28 MED ORDER — LACTATED RINGERS IV SOLN
1000.0000 mL | Freq: Once | INTRAVENOUS | Status: AC
  Administered 2018-05-28: 1000 mL via INTRAVENOUS

## 2018-05-28 MED ORDER — FENTANYL CITRATE (PF) 100 MCG/2ML IJ SOLN
INTRAMUSCULAR | Status: AC
  Filled 2018-05-28: qty 2

## 2018-05-28 NOTE — Progress Notes (Signed)
Nursing Pain Medication Assessment:  Safety precautions to be maintained throughout the outpatient stay will include: orient to surroundings, keep bed in low position, maintain call bell within reach at all times, provide assistance with transfer out of bed and ambulation.  Medication Inspection Compliance: Pill count conducted under aseptic conditions, in front of the patient. Neither the pills nor the bottle was removed from the patient's sight at any time. Once count was completed pills were immediately returned to the patient in their original bottle.  Medication: Oxycodone IR Pill/Patch Count: 6 of 60 pills remain Pill/Patch Appearance: Markings consistent with prescribed medication Bottle Appearance: Standard pharmacy container. Clearly labeled. Filled Date: 07 / 31 / 2019 Last Medication intake:  Yesterday

## 2018-05-28 NOTE — Progress Notes (Addendum)
Patient's Name: Hayden Hall  MRN: [id number]  Referring Provider: Theotis Burrow*  DOB: 02-06-1956  PCP: Theotis Burrow, MD  DOS: 05/28/2018  Note by: Gillis Santa, MD  Service setting: Ambulatory outpatient  Specialty: Interventional Pain Management  Patient type: Established  Location: [facility] (AMB) Pain Management Facility  Visit type: Interventional Procedure   Primary Reason for Visit: Interventional Pain Management Treatment. CC: Shoulder Pain (left )  Procedure:          Anesthesia, Analgesia, Anxiolysis:  Type: Therapeutic Glenohumeral Joint (shoulder) Injection          CPT: 20610      Primary Purpose: Diagnostic Region: Superior Shoulder Area Level:  Shoulder Target Area: Glenohumeral Joint (shoulder) Approach: Posterolateral approach. Laterality: Bilateral Position: Prone  Type: Moderate (Conscious) Sedation combined with Local Anesthesia Indication(s): Analgesia and Anxiety Route: Intravenous (IV) IV Access: Secured Sedation: Meaningful verbal contact was maintained at all times during the procedure  Local Anesthetic: Lidocaine 1-2%   Indications: 1. Primary osteoarthritis of right shoulder   2. History of rotator cuff surgery   3. Chronic pain syndrome    Patient is endorsing bilateral shoulder pain, right greater than left.  He states that he has an appointment with his orthopedic surgeon next week to discuss possible surgical intervention.  Pain Score: Pre-procedure: 9 /10 Post-procedure: 6 /10  Pre-op Assessment:  Hayden Hall is a 62 y.o. (year old), male patient, seen today for interventional treatment. He  has a past surgical history that includes Trigger finger release; Rotator cuff repair (Left); and Rotator cuff repair (Right). Hayden Hall has a current medication list which includes the following prescription(s): ascorbic acid, bilberry, vitamin d3, cyanocobalamin, dicyclomine, diphenhyd-hydrocort-nystatin, docosahexaenoic acid, ginger  root, levothyroxine, milk thistle, multi-vitamins, omeprazole, oxycodone, oxycodone, promethazine, terbinafine, tizanidine, tramadol, turmeric, valacyclovir, and zinc gluconate, and the following Facility-Administered Medications: fentanyl and iopamidol. His primarily concern today is the Shoulder Pain (left )  Initial Vital Signs:  Pulse/HCG Rate: (!) 55ECG Heart Rate: (!) 56 Temp: 97.6 F (36.4 C) Resp: 16 BP: (!) 139/95 SpO2: 100 %  BMI: Estimated body mass index is 24.07 kg/m as calculated from the following:   Height as of this encounter: 5\' 11"  (1.803 m).   Weight as of this encounter: 172 lb 9.6 oz (78.3 kg).  Risk Assessment: Allergies: Reviewed. He is allergic to vesicare [solifenacin].  Allergy Precautions: None required Coagulopathies: Reviewed. None identified.  Blood-thinner therapy: None at this time Active Infection(s): Reviewed. None identified. Hayden Hall is afebrile  Site Confirmation: Hayden Hall was asked to confirm the procedure and laterality before marking the site Procedure checklist: Completed Consent: Before the procedure and under the influence of no sedative(s), amnesic(s), or anxiolytics, the patient was informed of the treatment options, risks and possible complications. To fulfill our ethical and legal obligations, as recommended by the American Medical Association's Code of Ethics, I have informed the patient of my clinical impression; the nature and purpose of the treatment or procedure; the risks, benefits, and possible complications of the intervention; the alternatives, including doing nothing; the risk(s) and benefit(s) of the alternative treatment(s) or procedure(s); and the risk(s) and benefit(s) of doing nothing. The patient was provided information about the general risks and possible complications associated with the procedure. These may include, but are not limited to: failure to achieve desired goals, infection, bleeding, organ or nerve damage,  allergic reactions, paralysis, and death. In addition, the patient was informed of those risks and complications associated to the procedure,  such as failure to decrease pain; infection; bleeding; organ or nerve damage with subsequent damage to sensory, motor, and/or autonomic systems, resulting in permanent pain, numbness, and/or weakness of one or several areas of the body; allergic reactions; (i.e.: anaphylactic reaction); and/or death. Furthermore, the patient was informed of those risks and complications associated with the medications. These include, but are not limited to: allergic reactions (i.e.: anaphylactic or anaphylactoid reaction(s)); adrenal axis suppression; blood sugar elevation that in diabetics may result in ketoacidosis or comma; water retention that in patients with history of congestive heart failure may result in shortness of breath, pulmonary edema, and decompensation with resultant heart failure; weight gain; swelling or edema; medication-induced neural toxicity; particulate matter embolism and blood vessel occlusion with resultant organ, and/or nervous system infarction; and/or aseptic necrosis of one or more joints. Finally, the patient was informed that Medicine is not an exact science; therefore, there is also the possibility of unforeseen or unpredictable risks and/or possible complications that may result in a catastrophic outcome. The patient indicated having understood very clearly. We have given the patient no guarantees and we have made no promises. Enough time was given to the patient to ask questions, all of which were answered to the patient's satisfaction. Hayden Hall has indicated that he wanted to continue with the procedure. Attestation: I, the ordering provider, attest that I have discussed with the patient the benefits, risks, side-effects, alternatives, likelihood of achieving goals, and potential problems during recovery for the procedure that I have provided  informed consent. Date  Time: 05/28/2018 10:26 AM  Pre-Procedure Preparation:  Monitoring: As per clinic protocol. Respiration, ETCO2, SpO2, BP, heart rate and rhythm monitor placed and checked for adequate function Safety Precautions: Patient was assessed for positional comfort and pressure points before starting the procedure. Time-out: I initiated and conducted the "Time-out" before starting the procedure, as per protocol. The patient was asked to participate by confirming the accuracy of the "Time Out" information. Verification of the correct person, site, and procedure were performed and confirmed by me, the nursing staff, and the patient. "Time-out" conducted as per Joint Commission's Universal Protocol (UP.01.01.01). Time: 1146  Description of Procedure:          Area Prepped: Entire shoulder Area Prepping solution: ChloraPrep (2% chlorhexidine gluconate and 70% isopropyl alcohol) Safety Precautions: Aspiration looking for blood return was conducted prior to all injections. At no point did we inject any substances, as a needle was being advanced. No attempts were made at seeking any paresthesias. Safe injection practices and needle disposal techniques used. Medications properly checked for expiration dates. SDV (single dose vial) medications used. Description of the Procedure: Protocol guidelines were followed. The patient was placed in position over the procedure table. The target area was identified and the area prepped in the usual manner. Skin & deeper tissues infiltrated with local anesthetic. Appropriate amount of time allowed to pass for local anesthetics to take effect. The procedure needles were then advanced to the target area. Proper needle placement secured. Negative aspiration confirmed. Solution injected in intermittent fashion, asking for systemic symptoms every 0.5cc of injectate. The needles were then removed and the area cleansed, making sure to leave some of the prepping  solution back to take advantage of its long term bactericidal properties.    Vitals:   05/28/18 1158 05/28/18 1207 05/28/18 1217 05/28/18 1227  BP: (!) 146/93 134/86 (!) 160/94 (!) 139/91  Pulse: 61     Resp: 16 16 16 16   Temp:  98 F (36.7 C)  TempSrc:      SpO2: 99% 100% 98% 99%  Weight:      Height:        Start Time: 1146 hrs. End Time: 1158 hrs. Materials:  Needle(s) Type: Regular needle Gauge: 22G Length: 3.5-in Medication(s): Please see orders for medications and dosing details. 6 cc solution made of 5 cc of 0.2% ropivacaine, 1 cc of Decadron 10 mg/cc.  3 cc injected intra-articular per shoulder. Imaging Guidance (Non-Spinal):          Type of Imaging Technique: Fluoroscopy Guidance (Non-Spinal) Indication(s): Assistance in needle guidance and placement for procedures requiring needle placement in or near specific anatomical locations not easily accessible without such assistance. Exposure Time: Please see nurses notes. Contrast: Before injecting any contrast, we confirmed that the patient did not have an allergy to iodine, shellfish, or radiological contrast. Once satisfactory needle placement was completed at the desired level, radiological contrast was injected. Contrast injected under live fluoroscopy. No contrast complications. See chart for type and volume of contrast used. Fluoroscopic Guidance: I was personally present during the use of fluoroscopy. "Tunnel Vision Technique" used to obtain the best possible view of the target area. Parallax error corrected before commencing the procedure. "Direction-depth-direction" technique used to introduce the needle under continuous pulsed fluoroscopy. Once target was reached, antero-posterior, oblique, and lateral fluoroscopic projection used confirm needle placement in all planes. Images permanently stored in EMR. Interpretation: I personally interpreted the imaging intraoperatively. Adequate needle placement confirmed in  multiple planes. Appropriate spread of contrast into desired area was observed. No evidence of afferent or efferent intravascular uptake. Permanent images saved into the patient's record.  Antibiotic Prophylaxis:   Anti-infectives (From admission, onward)   None     Indication(s): None identified  Post-operative Assessment:  Post-procedure Vital Signs:  Pulse/HCG Rate: 61(!) 59 Temp: 98 F (36.7 C) Resp: 16 BP: (!) 139/91 SpO2: 99 %  EBL: None  Complications: No immediate post-treatment complications observed by team, or reported by patient.  Note: The patient tolerated the entire procedure well. A repeat set of vitals were taken after the procedure and the patient was kept under observation following institutional policy, for this type of procedure. Post-procedural neurological assessment was performed, showing return to baseline, prior to discharge. The patient was provided with post-procedure discharge instructions, including a section on how to identify potential problems. Should any problems arise concerning this procedure, the patient was given instructions to immediately contact us, at any time, without hesitation. In any case, we plan to contact the patient by telephone for a follow-up status report regarding this interventional procedure.  Comments:  No additional relevant information. 5 out of 5 strength bilateral upper extremity: Shoulder abduction, elbow flexion, elbow extension, thumb extension.  Plan of Care   Imaging Orders     DG C-Arm 1-60 Min-No Report Procedure Orders    No procedure(s) ordered today    Medications ordered for procedure: Meds ordered this encounter  Medications  . ropivacaine (PF) 2 mg/mL (0.2%) (NAROPIN) injection 10 mL  . lidocaine (XYLOCAINE) 2 % (with pres) injection 400 mg  . dexamethasone (DECADRON) injection 10 mg  . lactated ringers infusion 1,000 mL  . fentaNYL (SUBLIMAZE) injection 25-100 mcg    Make sure Narcan is available in  the pyxis when using this medication. In the event of respiratory depression (RR< 8/min): Titrate NARCAN (naloxone) in increments of 0.1 to 0.2 mg IV at 2-3 minute intervals, until desired degree of reversal.  .  iopamidol (ISOVUE-M) 41 % intrathecal injection 10 mL   Medications administered: We administered ropivacaine (PF) 2 mg/mL (0.2%), lidocaine, dexamethasone, lactated ringers, and fentaNYL.  See the medical record for exact dosing, route, and time of administration.  New Prescriptions   No medications on file   Disposition: Discharge home  Discharge Date & Time: 05/28/2018; 1229 hrs.   Physician-requested Follow-up: No follow-ups on file.  Future Appointments  Date Time Provider [place]  06/26/2018  9:45 AM Gillis Santa, MD [facility] None  07/12/2018  9:30 AM Gillis Santa, MD [facility] None   Primary Care Physician: Theotis Burrow, MD Location: [facility] Out[facility] Management Facility Note by: Gillis Santa, MD Date: 05/28/2018; Time: 1:30 PM  Disclaimer:  Medicine is not an exact science. The only guarantee in medicine is that nothing is guaranteed. It is important to note that the decision to proceed with this intervention was based on the information collected from the patient. The Data and conclusions were drawn from the patient's questionnaire, the interview, and the physical examination. Because the information was provided in large part by the patient, it cannot be guaranteed that it has not been purposely or unconsciously manipulated. Every effort has been made to obtain as much relevant data as possible for this evaluation. It is important to note that the conclusions that lead to this procedure are derived in large part from the available data. Always take into account that the treatment will also be dependent on availability of resources and existing treatment guidelines, considered by other Pain Management Practitioners as being common knowledge and  practice, at the time of the intervention. For Medico-Legal purposes, it is also important to point out that variation in procedural techniques and pharmacological choices are the acceptable norm. The indications, contraindications, technique, and results of the above procedure should only be interpreted and judged by a Board-Certified Interventional Pain Specialist with extensive familiarity and expertise in the same exact procedure and technique.

## 2018-05-28 NOTE — Addendum Note (Signed)
Addended by: Gillis Santa on: 05/28/2018 01:30 PM   Modules accepted: Orders

## 2018-05-28 NOTE — Patient Instructions (Signed)

## 2018-05-29 ENCOUNTER — Telehealth: Payer: Self-pay | Admitting: *Deleted

## 2018-05-29 ENCOUNTER — Emergency Department
Admission: EM | Admit: 2018-05-29 | Discharge: 2018-05-29 | Disposition: A | Discharge status: Home / Self Care | Payer: Medicaid Other | Attending: Emergency Medicine | Admitting: Emergency Medicine

## 2018-05-29 ENCOUNTER — Encounter: Payer: Self-pay | Admitting: Emergency Medicine

## 2018-05-29 DIAGNOSIS — Z87891 Personal history of nicotine dependence: Secondary | ICD-10-CM | POA: Diagnosis not present

## 2018-05-29 DIAGNOSIS — Y929 Unspecified place or not applicable: Secondary | ICD-10-CM | POA: Insufficient documentation

## 2018-05-29 DIAGNOSIS — S39012A Strain of muscle, fascia and tendon of lower back, initial encounter: Secondary | ICD-10-CM | POA: Insufficient documentation

## 2018-05-29 DIAGNOSIS — X58XXXA Exposure to other specified factors, initial encounter: Secondary | ICD-10-CM | POA: Insufficient documentation

## 2018-05-29 DIAGNOSIS — Y9301 Activity, walking, marching and hiking: Secondary | ICD-10-CM | POA: Diagnosis not present

## 2018-05-29 DIAGNOSIS — Y999 Unspecified external cause status: Secondary | ICD-10-CM | POA: Diagnosis not present

## 2018-05-29 DIAGNOSIS — S3992XA Unspecified injury of lower back, initial encounter: Secondary | ICD-10-CM | POA: Diagnosis present

## 2018-05-29 DIAGNOSIS — E039 Hypothyroidism, unspecified: Secondary | ICD-10-CM | POA: Insufficient documentation

## 2018-05-29 DIAGNOSIS — Z79899 Other long term (current) drug therapy: Secondary | ICD-10-CM | POA: Insufficient documentation

## 2018-05-29 MED ORDER — CYCLOBENZAPRINE HCL 10 MG PO TABS: 10.0000 mg | ORAL_TABLET | Freq: Three times a day (TID) | ORAL | 0 refills | Status: DC | PRN

## 2018-05-29 MED ORDER — ORPHENADRINE CITRATE 30 MG/ML IJ SOLN
60.0000 mg | Freq: Two times a day (BID) | INTRAMUSCULAR | Status: DC
  Administered 2018-05-29: 60 mg via INTRAMUSCULAR
  Filled 2018-05-29: qty 2

## 2018-05-29 MED ORDER — OXYCODONE-ACETAMINOPHEN 7.5-325 MG PO TABS: 1.0000 | ORAL_TABLET | Freq: Four times a day (QID) | ORAL | 0 refills | Status: DC | PRN

## 2018-05-29 MED ORDER — KETOROLAC TROMETHAMINE 30 MG/ML IJ SOLN
30.0000 mg | Freq: Once | INTRAMUSCULAR | Status: AC
  Administered 2018-05-29: 30 mg via INTRAVENOUS
  Filled 2018-05-29: qty 1

## 2018-05-29 NOTE — Telephone Encounter (Signed)
No problems post procedure. 

## 2018-05-29 NOTE — ED Triage Notes (Signed)
Pt arrived with complaints of lower right sided back pain. PT reports stepping down wrong last night and feeling a sudden pain. Pt denies any fall. Pt ambulates with a steady gait to triage room and it able to move all four extremities.

## 2018-05-29 NOTE — ED Notes (Signed)
See triage note  Presents with right lower back pain  States he was taking something out of trunk and developed lower back pain  Ambulates well

## 2018-05-29 NOTE — ED Provider Notes (Signed)
[facility] Emergency Department Provider Note   ____________________________________________   First MD Initiated Contact with Patient 05/29/18 1246     (approximate)  I have reviewed the triage vital signs and the nursing notes.   HISTORY  Chief Complaint Back Pain    HPI Hayden Hall is a 62 y.o. male patient complain her right side low back pain radiating to the buttocks.  Patient states he stepped wrong last night while turning and carrying out trash.  Patient described the pain is "sharp".  Patient denies radicular component to his pain.  Patient denies bladder bowel dysfunction.  Patient stated pain is a constant 4/10.  Patient had pain increases to 8/10 with movement.   Past Medical History:  Diagnosis Date  . Anxiety   . Arthritis    RA   . BPH (benign prostatic hyperplasia)   . Bronchitis   . Chronic constipation   . Chronic pain   . Depression   . Hepatitis C   . Herpes   . History of pancreatitis   . History of seizure disorder   . Hypothyroidism   . Memory impairment   . Myoclonic jerking   . Neck injury   . Nocturia   . SOB (shortness of breath)   . Trigger finger   . Urinary frequency     Patient Active Problem List   Diagnosis Date Noted  . 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 Osteoarthritis of spine with radiculopathy, cervical region 02/13/2018  . Cervicalgia 12/07/2017  . History of rotator cuff surgery 12/07/2017  . Cervical radiculopathy 12/07/2017  . DDD (degenerative disc disease), cervical 12/07/2017  . Chronic pain syndrome 12/07/2017  . BPH with obstruction/lower urinary tract symptoms 10/21/2015    Past Surgical History:  Procedure Laterality Date  . ROTATOR CUFF REPAIR Left   . ROTATOR CUFF REPAIR Right   . TRIGGER FINGER RELEASE      Prior to Admission medications   Medication Sig Start Date End Date Taking? Authorizing Provider  Ascorbic Acid (VITAMIN C PO) Take by mouth.     [provider]  Bilberry 1000 MG CAPS Take 1,000 mg by mouth 2 (two) times daily.    [provider]  Cholecalciferol (VITAMIN D3) 2000 units TABS Take 2,000 Units by mouth 2 (two) times daily.    [provider]  Cyanocobalamin (VITAMIN B 12 PO) Take by mouth.    [provider]  cyclobenzaprine (FLEXERIL) 10 MG tablet Take 1 tablet (10 mg total) by mouth 3 (three) times daily as needed. 05/29/18   Sable Feil, PA-C  dicyclomine (BENTYL) 10 MG capsule Take 10 mg by mouth 4 (four) times daily -  before meals and at bedtime.    [provider]  Diphenhyd-Hydrocort-Nystatin (FIRST-DUKES MOUTHWASH MT) Reported on 10/19/2015 09/28/15   [provider]  DOCOSAHEXAENOIC ACID PO Take by mouth.    [provider]  Ginger, Zingiber officinalis, (GINGER ROOT) 550 MG CAPS Take 550 mg by mouth 2 (two) times daily.    [provider]  levothyroxine (SYNTHROID, LEVOTHROID) 25 MCG tablet Take 25 mcg by mouth daily. 09/16/15   [provider]  milk thistle 175 MG tablet Take 175 mg by mouth 2 (two) times daily.    [provider]  Multiple Vitamin (MULTI-VITAMINS) TABS Take by mouth.    [provider]  Omeprazole (RA OMEPRAZOLE) 20 MG TBEC Take 20 mg by mouth. Reported on 10/19/2015 12/24/08  [provider]  oxyCODONE (OXY IR/ROXICODONE) 5 MG immediate release tablet 5 mg BID-TID prn moderate to severe shoulder pain. Max 75/month 06/01/18 07/01/18  Gillis Santa, MD  oxyCODONE (OXY IR/ROXICODONE) 5 MG immediate release tablet 5 mg BID- TID prn moderate to severe shoulder pain. Max 75/month 07/01/18 07/30/18  Gillis Santa, MD  oxyCODONE-acetaminophen (PERCOCET) 7.5-325 MG tablet Take 1 tablet by mouth every 6 (six) hours as needed for severe pain. 05/29/18   Sable Feil, PA-C  promethazine (PHENERGAN) 25 MG tablet Take 25 mg by mouth. Reported on 10/19/2015    [provider]  terbinafine  (LAMISIL) 250 MG tablet Take 1 tablet (250 mg total) by mouth daily. 04/28/18   Edrick Kins, DPM  tiZANidine (ZANAFLEX) 4 MG tablet Take 4 mg by mouth every 6 (six) hours as needed for muscle spasms.    [provider]  traMADol (ULTRAM) 50 MG tablet Take by mouth every 6 (six) hours as needed. for pain 08/20/15   [provider]  Turmeric (CURCUMIN 95) 500 MG CAPS Take 400 mg by mouth 2 (two) times daily.    [provider]  valACYclovir (VALTREX) 500 MG tablet Take 500 mg by mouth. 01/17/11   [provider]  zinc gluconate 50 MG tablet Take 50 mg by mouth daily.    [provider]    Allergies Vesicare [solifenacin]  Family History  Problem Relation Age of Onset  . Breast cancer Mother   . Kidney disease Mother   . Bladder Cancer Mother   . Heart disease Unknown   . Kidney disease Cousin   . Prostate cancer Father   . Kidney cancer Neg Hx     Social History Social History   Tobacco Use  . Smoking status: Former Research scientist (life sciences)  . Smokeless tobacco: Never Used  . Tobacco comment: quit 18 years  Substance Use Topics  . Alcohol use: No    Alcohol/week: 0.0 standard drinks  . Drug use: No    Review of Systems  Constitutional: No fever/chills Eyes: No visual changes. ENT: No sore throat. Cardiovascular: Denies chest pain. Respiratory: Denies shortness of breath. Gastrointestinal: No abdominal pain.  No nausea, no vomiting.  No diarrhea.  No constipation. Genitourinary: Negative for dysuria. Musculoskeletal:  chronic back pain Skin: Negative for rash. Neurological: Negative for headaches, focal weakness or numbness. Psychiatric:Anxiety and depression Endocrine:Hypothyroidism otitis see Allergic/Immunilogical: Vesicare ____________________________________________   PHYSICAL EXAM:  VITAL SIGNS: ED Triage Vitals [05/29/18 1231]  Enc Vitals Group     BP 131/89     Pulse Rate 78     Resp 18     Temp 97.8 F (36.6 C)     Temp  Source Oral     SpO2 97 %     Weight 172 lb 9.6 oz (78.3 kg)     Height 5\' 11"  (1.803 m)     Head Circumference      Peak Flow      Pain Score 4     Pain Loc      Pain Edu?      Excl. in [place]?     Constitutional: Alert and oriented. Well appearing and in no acute distress. Cardiovascular: Normal rate, regular rhythm. Grossly normal heart sounds.  Good peripheral circulation. Respiratory: Normal respiratory effort.  No retractions. Lungs CTAB. Musculoskeletal: No obvious spinal deformity.  Patient decreased range of motion with flexion.  No lower extremity tenderness nor edema.  No joint effusions. Neurologic:  Normal speech and  language. No gross focal neurologic deficits are appreciated. No gait instability. Skin:  Skin is warm, dry and intact. No rash noted. Psychiatric: Mood and affect are normal. Speech and behavior are normal.  ____________________________________________   LABS (all labs ordered are listed, but only abnormal results are displayed)  Labs Reviewed - No data to display ____________________________________________  EKG   ____________________________________________  RADIOLOGY  ED MD interpretation:    Official radiology report(s): No results found.  ____________________________________________   PROCEDURES  Procedure(s) performed: None  Procedures  Critical Care performed: No  ____________________________________________   INITIAL IMPRESSION / ASSESSMENT AND PLAN / ED COURSE  As part of my medical decision making, I reviewed the following data within the electronic MEDICAL RECORD NUMBER    Back pain secondary to lumbar strain.  Patient given discharge care instruction advised take medication as directed.  Patient advised follow-up PCP.      ____________________________________________   FINAL CLINICAL IMPRESSION(S) / ED DIAGNOSES  Final diagnoses:  Strain of lumbar region, initial encounter     ED Discharge Orders         Ordered     oxyCODONE-acetaminophen (PERCOCET) 7.5-325 MG tablet  Every 6 hours PRN     05/29/18 1256    cyclobenzaprine (FLEXERIL) 10 MG tablet  3 times daily PRN     05/29/18 1256           Note:  This document was prepared using Dragon voice recognition software and may include unintentional dictation errors.    Sable Feil, PA-C 05/29/18 1312    Lavonia Drafts, MD 05/29/18 1322

## 2018-06-14 ENCOUNTER — Ambulatory Visit: Payer: Medicaid Other | Admitting: Student in an Organized Health Care Education/Training Program

## 2018-06-26 ENCOUNTER — Other Ambulatory Visit: Payer: Self-pay | Admitting: Orthopedic Surgery

## 2018-06-26 ENCOUNTER — Ambulatory Visit: Payer: Medicaid Other | Admitting: Student in an Organized Health Care Education/Training Program

## 2018-06-27 ENCOUNTER — Encounter (HOSPITAL_COMMUNITY): Payer: Self-pay | Admitting: *Deleted

## 2018-06-27 NOTE — Progress Notes (Signed)
Anesthesia Chart Review: SAME DAY WORKUP   Case:  [id number] Date/Time:  06/28/18 1324   Procedure:  LEFT SHOULDER ARTHROSCOPIC ROTATOR CUFF TEAR VS. DEBRIDEMENT, SUBACROMIAL DECOMPRESSION (Left Shoulder)   Anesthesia type:  Choice   Pre-op diagnosis:  LEFT SHOULDER ROTATOR CUFF TEAR   Location:  [place] [facility] ROOM 07 / [place] [facility]   Surgeon:  Tania Ade, MD      DISCUSSION: 62 yo male former smoker. Pertinent hx includes Hypothyroid, Hep C (s/p treatment with Harvoni), COPD (mild per pulm notes in care everywhere), Chronic pain, Depression, Anxiety, Seizure disorder, Per notes in care everywhere hx of alcohol and drug abuse.  Pt follows with cardiology at [facility] for SOB and chest pain. He was last seen by Dr. Clayborn Bigness 05/01/2018. Per notes the pt was doing well without CP, no cardiopulmonary complaints. Advised 1 year followup.  He had a low risk nuclear stress test 10/06/2017 showing small defect of mild severity consistent with prior MI. No ST segment deviation noted during stress. EF moderately decreased 30-44%.  Discussed with Dr. Tobias Alexander. He advised that as long as pt is asymptomatic can proceed as planned.  When patient was called by PAT nursing he stated he did not want to have surgery tomorrow and was advised to call surgeon's office. Unclear at this time if he is being rescheduled. If he is rescheduled I still anticipate he will be able to proceed as long as he is asymptomatic from a cardiac standpoint.  VS: There were no vitals taken for this visit.  PROVIDERS: Revelo, Elyse Jarvis, MD is PCP  Lujean Amel, MD is Cardiologist last seen 05/01/2018  Wallene Huh, MD is Pulmonologist last seen 04/15/2015  LABS: Will need DOS labs  IMAGES: CHEST - 2 VIEW 02/19/2018  COMPARISON:  April 17, 2012  FINDINGS: The heart size and mediastinal contours are within normal limits. Both lungs are clear. The visualized skeletal structures are unremarkable.  IMPRESSION: No active  cardiopulmonary disease  EKG: 12/04/2016: Sinus brady 53bpm  CV: Myocardial perfusion scan 10/06/2017:  Blood pressure demonstrated a normal response to exercise.  There was no ST segment deviation noted during stress.  Defect 1: There is a small defect of mild severity present in the apex location.  Findings consistent with prior myocardial infarction.  This is a low risk study.  The left ventricular ejection fraction is moderately decreased (30-44%).    Past Medical History:  Diagnosis Date  . Anxiety   . Arthritis    RA   . BPH (benign prostatic hyperplasia)   . Bronchitis   . Chronic constipation   . Chronic pain   . Depression   . Hepatitis C   . Herpes   . History of pancreatitis   . History of seizure disorder   . Hypothyroidism   . Memory impairment   . Myoclonic jerking   . Neck injury   . Nocturia   . SOB (shortness of breath)   . Trigger finger   . Urinary frequency     Past Surgical History:  Procedure Laterality Date  . ROTATOR CUFF REPAIR Left   . ROTATOR CUFF REPAIR Right   . TRIGGER FINGER RELEASE      MEDICATIONS: No current facility-administered medications for this encounter.    . Ascorbic Acid (VITAMIN C PO)  . Bilberry 1000 MG CAPS  . Cholecalciferol (VITAMIN D3) 2000 units TABS  . Cyanocobalamin (VITAMIN B 12 PO)  . cyclobenzaprine (FLEXERIL) 10 MG tablet  . dicyclomine (BENTYL) 10  MG capsule  . Diphenhyd-Hydrocort-Nystatin (FIRST-DUKES MOUTHWASH MT)  . DOCOSAHEXAENOIC ACID PO  . Ginger, Zingiber officinalis, (GINGER ROOT) 550 MG CAPS  . levothyroxine (SYNTHROID, LEVOTHROID) 25 MCG tablet  . milk thistle 175 MG tablet  . Multiple Vitamin (MULTI-VITAMINS) TABS  . Omeprazole (RA OMEPRAZOLE) 20 MG TBEC  . oxyCODONE (OXY IR/ROXICODONE) 5 MG immediate release tablet  . [START ON 07/01/2018] oxyCODONE (OXY IR/ROXICODONE) 5 MG immediate release tablet  . oxyCODONE-acetaminophen (PERCOCET) 7.5-325 MG tablet  . promethazine (PHENERGAN) 25  MG tablet  . terbinafine (LAMISIL) 250 MG tablet  . tiZANidine (ZANAFLEX) 4 MG tablet  . traMADol (ULTRAM) 50 MG tablet  . Turmeric (CURCUMIN 95) 500 MG CAPS  . valACYclovir (VALTREX) 500 MG tablet  . zinc gluconate 50 MG tablet     Wynonia Musty [facility] Short Stay Center/Anesthesiology Phone [phone] 06/27/2018 12:42 PM

## 2018-07-05 ENCOUNTER — Other Ambulatory Visit: Payer: Self-pay

## 2018-07-05 ENCOUNTER — Ambulatory Visit
Payer: Medicaid Other | Attending: Student in an Organized Health Care Education/Training Program | Admitting: Student in an Organized Health Care Education/Training Program

## 2018-07-05 ENCOUNTER — Encounter: Payer: Self-pay | Admitting: Student in an Organized Health Care Education/Training Program

## 2018-07-05 VITALS — BP 112/79 | HR 76 | Temp 98.9°F | Resp 16 | Ht 71.0 in | Wt 180.0 lb

## 2018-07-05 DIAGNOSIS — M47812 Spondylosis without myelopathy or radiculopathy, cervical region: Secondary | ICD-10-CM

## 2018-07-05 DIAGNOSIS — Z9889 Other specified postprocedural states: Secondary | ICD-10-CM

## 2018-07-05 DIAGNOSIS — M542 Cervicalgia: Secondary | ICD-10-CM

## 2018-07-05 DIAGNOSIS — M19012 Primary osteoarthritis, left shoulder: Secondary | ICD-10-CM

## 2018-07-05 DIAGNOSIS — G894 Chronic pain syndrome: Secondary | ICD-10-CM

## 2018-07-05 DIAGNOSIS — M5030 Other cervical disc degeneration, unspecified cervical region: Secondary | ICD-10-CM

## 2018-07-05 DIAGNOSIS — M75101 Unspecified rotator cuff tear or rupture of right shoulder, not specified as traumatic: Secondary | ICD-10-CM

## 2018-07-05 DIAGNOSIS — M19011 Primary osteoarthritis, right shoulder: Secondary | ICD-10-CM | POA: Diagnosis not present

## 2018-07-05 NOTE — Progress Notes (Deleted)
Patient's Name: Hayden Hall  MRN: [id number]  Referring Provider: Theotis Hall*  DOB: Jul 13, 1956  PCP: Hayden Burrow, MD  DOS: 07/05/2018  Note by: Hayden Santa, MD  Service setting: Ambulatory outpatient  Specialty: Interventional Pain Management  Location: [facility] (AMB) Pain Management Facility    Patient type: Established   Primary Reason(s) for Visit: Evaluation of chronic illnesses with exacerbation, or progression (Level of risk: moderate) CC: Foot Pain (bilaterally)  HPI  Hayden Hall is a 62 y.o. year old, male patient, who comes today for a follow-up evaluation. He has BPH with obstruction/lower urinary tract symptoms; Cervicalgia; History of rotator cuff surgery; Cervical radiculopathy; DDD (degenerative disc disease), cervical; Chronic pain syndrome; Primary osteoarthritis of right shoulder; and Osteoarthritis of spine with radiculopathy, cervical region on their problem list. Hayden Hall was last seen on 05/28/2018. His primarily concern today is the Foot Pain (bilaterally)  Pain Assessment: Location: Right, Left Foot Radiating: right foot pain radiates up to ankle; left foot pain radiates up to sacral area Onset: More than a month ago Duration: Chronic pain Quality: Constant, Shooting Severity: 10-Worst pain ever/10 (subjective, self-reported pain score)  Note: Reported level is compatible with observation.                         When using our objective Pain Scale, levels between 6 and 10/10 are said to belong in an emergency room, as it progressively worsens from a 6/10, described as severely limiting, requiring emergency care not usually available at an outpatient pain management facility. At a 6/10 level, communication becomes difficult and requires great effort. Assistance to reach the emergency department may be required. Facial flushing and profuse sweating along with potentially dangerous increases in heart rate and blood pressure will be  evident. Effect on ADL:   Timing: Constant Modifying factors: meds "knock the edge off" BP: 112/79  HR: 76  Further details on both, my assessment(s), as well as the proposed treatment plan, please see below.  Laboratory Chemistry  Inflammation Markers (CRP: Acute Phase) (ESR: Chronic Phase) No results found for: CRP, ESRSEDRATE, LATICACIDVEN                       Rheumatology Markers No results found for: RF, ANA, LABURIC, URICUR, LYMEIGGIGMAB, LYMEABIGMQN, HLAB27                      Renal Function Markers Lab Results  Component Value Date   BUN 26 (H) 05/17/2017   CREATININE 0.86 05/17/2017   GFRAA >60 05/17/2017   GFRNONAA >60 05/17/2017                             Hepatic Function Markers Lab Results  Component Value Date   AST 18 04/27/2018   ALT 13 04/27/2018   ALBUMIN 4.8 04/27/2018   ALKPHOS 46 04/27/2018   LIPASE 107 (H) 05/17/2017                        Electrolytes Lab Results  Component Value Date   NA 138 05/17/2017   K 4.3 05/17/2017   CL 105 05/17/2017   CALCIUM 9.6 05/17/2017                        Neuropathy Markers No results found for: VITAMINB12, FOLATE, HGBA1C, HIV  CNS Tests No results found for: COLORCSF, APPEARCSF, RBCCOUNTCSF, WBCCSF, POLYSCSF, LYMPHSCSF, EOSCSF, PROTEINCSF, GLUCCSF, JCVIRUS, CSFOLI, IGGCSF                      Bone Pathology Markers No results found for: VD25OH, [id number], PT4656CL2, [id number], 25OHVITD1, 25OHVITD2, 25OHVITD3, TESTOFREE, TESTOSTERONE                       Coagulation Parameters Lab Results  Component Value Date   PLT 275 05/17/2017                        Cardiovascular Markers Lab Results  Component Value Date   TROPONINI <0.03 12/03/2016   HGB 13.7 05/17/2017   HCT 40.7 05/17/2017                         CA Markers No results found for: CEA, CA125, LABCA2                      Note: Lab results reviewed.  Recent Diagnostic Imaging Review  Cervical  Imaging: Cervical MR wo contrast:  Results for orders placed during the hospital encounter of 12/28/17  MR CERVICAL SPINE WO CONTRAST   Narrative CLINICAL DATA:  Neck pain since the 1970s, worse over the past 15 years. Pain radiates down the right shoulder  EXAM: MRI CERVICAL SPINE WITHOUT CONTRAST  TECHNIQUE: Multiplanar, multisequence MR imaging of the cervical spine was performed. No intravenous contrast was administered.  COMPARISON:  11/09/2016  FINDINGS: Alignment: Slight C3-4 and C7-T1 anterolisthesis  Vertebrae: No fracture, evidence of discitis, or bone lesion.  Cord: Normal signal and morphology.  Posterior Fossa, vertebral arteries, paraspinal tissues: Negative.  Disc levels:  C2-3: Mild facet spurring.  No impingement  C3-4: Disc narrowing with bulky rightward endplate and uncovertebral spurring. Asymmetric rightward degenerative facet spurring. High-grade right foraminal stenosis. Patent canal and left foramen  C4-5: Advanced degenerative disc narrowing with posterior disc osteophyte complex effacing the ventral subarachnoid space and contacting the ventral cord. Asymmetric rightward facet spurring. Left more than right uncovertebral spurring. Left foraminal impingement is high-grade. Patent right foramen  C5-6: Advanced degenerative disc narrowing with posterior disc osteophyte complex effacing the ventral subarachnoid space. Biforaminal impingement from disc height loss and uncovertebral spurring  C6-7: Advanced degenerative disc narrowing with right preferential disc bulging and endplate spurring. Right uncovertebral ridging. Moderate right foraminal narrowing. Patent left foramen. Ventral subarachnoid space effacement without cord compression  C7-T1:Facet arthropathy with slight anterolisthesis. Tiny central disc protrusion without neural contact.  IMPRESSION: 1. Advanced disc degeneration with right-sided C3-4/C4-5 facet arthropathy. No noted  progression since 11/09/2016. 2. Right foraminal impingement at C3-4, C5-6, and to a lesser extent at C6-7. 3. Left foraminal impingement at C4-5 and C5-6. 4. Mild, noncompressive spinal stenosis at C4-5 and C5-6.   Electronically Signed   By: Monte Fantasia M.D.   On: 12/28/2017 09:24    Cervical MR wo contrast: No procedure found. Cervical MR w/wo contrast: No results found for this or any previous visit. Cervical MR w contrast: No results found for this or any previous visit. Cervical CT wo contrast: No results found for this or any previous visit. Cervical CT w/wo contrast: No results found for this or any previous visit. Cervical CT w/wo contrast: No results found for this or any previous visit. Cervical CT w contrast: No results found for this or  any previous visit. Cervical CT outside: No results found for this or any previous visit. Cervical DG 1 view: No results found for this or any previous visit. Cervical DG 2-3 views: No results found for this or any previous visit. Cervical DG F/E views: No results found for this or any previous visit. Cervical DG 2-3 clearing views: No results found for this or any previous visit. Cervical DG Bending/F/E views: No results found for this or any previous visit. Cervical DG complete: No results found for this or any previous visit. Cervical DG Myelogram views: No results found for this or any previous visit. Cervical DG Myelogram views: No results found for this or any previous visit. Cervical Discogram views: No results found for this or any previous visit.  Shoulder Imaging: Shoulder-R MR w contrast: No results found for this or any previous visit. Shoulder-L MR w contrast: No results found for this or any previous visit. Shoulder-R MR w/wo contrast: No results found for this or any previous visit. Shoulder-L MR w/wo contrast: No results found for this or any previous visit. Shoulder-R MR wo contrast:  Results for orders placed  during the hospital encounter of 12/28/17  MR SHOULDER RIGHT WO CONTRAST   Narrative CLINICAL DATA:  Right shoulder pain for 2-3 years. Prior rotator cuff surgery 2016.  EXAM: MRI OF THE RIGHT SHOULDER WITHOUT CONTRAST  TECHNIQUE: Multiplanar, multisequence MR imaging of the shoulder was performed. No intravenous contrast was administered.  COMPARISON:  None.  FINDINGS: Rotator cuff: Large full-thickness retracted supraspinatus tendon tear. Maximum retraction is 3.5 cm. The infraspinatus and subscapularis tendons are intact.  Muscles: Retraction and minimal fatty atrophy of the supraspinatus muscle.  Biceps long head: Significant tendinopathy involving the intra-articular portion.  Acromioclavicular Joint: Moderate degenerative changes type 1-2 acromion. No lateral downsloping or undersurface spurring.  Glenohumeral Joint: Moderate degenerative changes. Small joint effusion. There is thickening of the capsular structures in the axillary recess which can be seen with adhesive capsulitis or synovitis.  Labrum:  Labral degenerative changes without obvious tear.  Bones:  No acute bony findings.  Other: Expected fluid in the subacromial/subdeltoid bursa.  IMPRESSION: 1. Large full-thickness retracted supraspinatus tendon tear. Retraction and minimal fatty atrophy of the supraspinatus muscle. The infraspinatus and subscapularis tendons are intact. 2. Significant tendinopathy involving the intra-articular portion of the long head biceps tendon. 3. No findings for bony impingement. 4. Labral degenerative changes without obvious tear. 5. Moderate glenohumeral joint degenerative changes.   Electronically Signed   By: Marijo Sanes M.D.   On: 12/28/2017 09:22    Shoulder-L MR wo contrast: No results found for this or any previous visit. Shoulder-R CT w contrast: No results found for this or any previous visit. Shoulder-L CT w contrast: No results found for this or any  previous visit. Shoulder-R CT w/wo contrast: No results found for this or any previous visit. Shoulder-L CT w/wo contrast: No results found for this or any previous visit. Shoulder-R CT wo contrast: No results found for this or any previous visit. Shoulder-L CT wo contrast: No results found for this or any previous visit. Shoulder-R DG Arthrogram: No results found for this or any previous visit. Shoulder-L DG Arthrogram: No results found for this or any previous visit. Shoulder-R DG 1 view: No results found for this or any previous visit. Shoulder-L DG 1 view: No results found for this or any previous visit. Shoulder-R DG:  Results for orders placed during the hospital encounter of 05/22/18  DG Shoulder Right  Narrative CLINICAL DATA:  Right shoulder pain. History of rotator cuff surgery  EXAM: RIGHT SHOULDER - 2+ VIEW  COMPARISON:  Right shoulder MRI 12/28/2017  FINDINGS: Normal alignment. Negative for fracture. Mild AC degenerative change.  IMPRESSION: Mild AC degeneration.   Electronically Signed   By: Franchot Gallo M.D.   On: 05/22/2018 14:48    Shoulder-L DG:  Results for orders placed during the hospital encounter of 05/22/18  DG Shoulder Left   Narrative CLINICAL DATA:  Left shoulder pain.  Rotator cuff surgery  EXAM: LEFT SHOULDER - 2+ VIEW  COMPARISON:  06/20/2014  FINDINGS: There is no evidence of fracture or dislocation. There is no evidence of arthropathy or other focal bone abnormality. Soft tissues are unremarkable.  IMPRESSION: Negative.   Electronically Signed   By: Franchot Gallo M.D.   On: 05/22/2018 14:46     Thoracic Imaging: Thoracic MR wo contrast: No results found for this or any previous visit. Thoracic MR wo contrast: No procedure found. Thoracic MR w/wo contrast: No results found for this or any previous visit. Thoracic MR w contrast: No results found for this or any previous visit. Thoracic CT wo contrast: No results found for  this or any previous visit. Thoracic CT w/wo contrast: No results found for this or any previous visit. Thoracic CT w/wo contrast: No results found for this or any previous visit. Thoracic CT w contrast: No results found for this or any previous visit. Thoracic DG 2-3 views: No results found for this or any previous visit. Thoracic DG 4 views: No results found for this or any previous visit. Thoracic DG: No results found for this or any previous visit. Thoracic DG w/swimmers view: No results found for this or any previous visit. Thoracic DG Myelogram views: No results found for this or any previous visit. Thoracic DG Myelogram views: No results found for this or any previous visit.  Lumbosacral Imaging: Lumbar MR wo contrast: No results found for this or any previous visit. Lumbar MR wo contrast: No procedure found. Lumbar MR w/wo contrast: No results found for this or any previous visit. Lumbar MR w contrast: No results found for this or any previous visit. Lumbar CT wo contrast: No results found for this or any previous visit. Lumbar CT w/wo contrast: No results found for this or any previous visit. Lumbar CT w/wo contrast: No results found for this or any previous visit. Lumbar CT w contrast: No results found for this or any previous visit. Lumbar DG 1V: No results found for this or any previous visit. Lumbar DG 1V (Clearing): No results found for this or any previous visit. Lumbar DG 2-3V (Clearing): No results found for this or any previous visit. Lumbar DG 2-3 views: No results found for this or any previous visit. Lumbar DG (Complete) 4+V: No results found for this or any previous visit. Lumbar DG F/E views: No results found for this or any previous visit. Lumbar DG Bending views: No results found for this or any previous visit. Lumbar DG Myelogram views: No results found for this or any previous visit. Lumbar DG Myelogram: No results found for this or any previous visit. Lumbar  DG Myelogram: No results found for this or any previous visit. Lumbar DG Myelogram: No results found for this or any previous visit. Lumbar DG Myelogram Lumbosacral: No results found for this or any previous visit. Lumbar DG Diskogram views: No results found for this or any previous visit. Lumbar DG Diskogram views: No results  found for this or any previous visit. Lumbar DG Epidurogram OP: No results found for this or any previous visit. Lumbar DG Epidurogram IP: No results found for this or any previous visit.  Sacroiliac Joint Imaging: Sacroiliac Joint DG: No results found for this or any previous visit. Sacroiliac Joint MR w/wo contrast: No results found for this or any previous visit. Sacroiliac Joint MR wo contrast: No results found for this or any previous visit.  Spine Imaging: Whole Spine DG Myelogram views: No results found for this or any previous visit. Whole Spine MR Mets screen: No results found for this or any previous visit. Whole Spine MR Mets screen: No results found for this or any previous visit. Whole Spine MR w/wo: No results found for this or any previous visit. MRA Spinal Canal w/ cm: No results found for this or any previous visit. MRA Spinal Canal wo/ cm: No procedure found. MRA Spinal Canal w/wo cm: No results found for this or any previous visit. Spine Outside MR Films: No results found for this or any previous visit. Spine Outside CT Films: No results found for this or any previous visit. CT-Guided Biopsy: No results found for this or any previous visit. CT-Guided Needle Placement: No results found for this or any previous visit. DG Spine outside: No results found for this or any previous visit. IR Spine outside: No results found for this or any previous visit. NM Spine outside: No results found for this or any previous visit. Epidurography 1: No results found for this or any previous visit. Epidurography 2: No results found for this or any previous  visit.  Hip Imaging: Hip-R MR w contrast: No results found for this or any previous visit. Hip-L MR w contrast: No results found for this or any previous visit. Hip-R MR w/wo contrast: No results found for this or any previous visit. Hip-L MR w/wo contrast: No results found for this or any previous visit. Hip-R MR wo contrast: No results found for this or any previous visit. Hip-L MR wo contrast: No results found for this or any previous visit. Hip-R CT w contrast: No results found for this or any previous visit. Hip-L CT w contrast: No results found for this or any previous visit. Hip-R CT w/wo contrast: No results found for this or any previous visit. Hip-L CT w/wo contrast: No results found for this or any previous visit. Hip-R CT wo contrast: No results found for this or any previous visit. Hip-L CT wo contrast: No results found for this or any previous visit. Hip-R DG 2-3 views: No results found for this or any previous visit. Hip-L DG 2-3 views: No results found for this or any previous visit. Hip-R DG Arthrogram: No results found for this or any previous visit. Hip-L DG Arthrogram: No results found for this or any previous visit. Hip-B DG Bilateral: No results found for this or any previous visit.  Knee Imaging: Knee-R MR w contrast: No results found for this or any previous visit. Knee-L MR w contrast: No results found for this or any previous visit. Knee-R MR w/wo contrast: No results found for this or any previous visit. Knee-L MR w/wo contrast: No results found for this or any previous visit. Knee-R MR wo contrast: No results found for this or any previous visit. Knee-L MR wo contrast: No results found for this or any previous visit. Knee-R CT w contrast: No results found for this or any previous visit. Knee-L CT w contrast: No results  found for this or any previous visit. Knee-R CT w/wo contrast: No results found for this or any previous visit. Knee-L CT w/wo contrast: No  results found for this or any previous visit. Knee-R CT wo contrast: No results found for this or any previous visit. Knee-L CT wo contrast: No results found for this or any previous visit. Knee-R DG 1-2 views: No results found for this or any previous visit. Knee-L DG 1-2 views: No results found for this or any previous visit. Knee-R DG 3 views: No results found for this or any previous visit. Knee-L DG 3 views: No results found for this or any previous visit. Knee-R DG 4 views: No results found for this or any previous visit. Knee-L DG 4 views: No results found for this or any previous visit. Knee-R DG Arthrogram: No results found for this or any previous visit. Knee-L DG Arthrogram: No results found for this or any previous visit.  Ankle Imaging: Ankle-R DG Complete: No results found for this or any previous visit. Ankle-L DG Complete: No results found for this or any previous visit.  Foot Imaging: Foot-R DG Complete:  Results for orders placed in visit on 02/19/18  DG Foot Complete Right   Narrative Please see detailed radiograph report in office note.   Foot-L DG Complete: No results found for this or any previous visit.  Complexity Note: Imaging results reviewed. Results shared with Mr. Mccarey, using Layman's terms.                         Meds   Current Outpatient Medications:  .  Ascorbic Acid (VITAMIN C PO), Take by mouth., Disp: , Rfl:  .  Bilberry 1000 MG CAPS, Take 1,000 mg by mouth 2 (two) times daily., Disp: , Rfl:  .  Cholecalciferol (VITAMIN D3) 2000 units TABS, Take 2,000 Units by mouth 2 (two) times daily., Disp: , Rfl:  .  Cyanocobalamin (VITAMIN B 12 PO), Take by mouth., Disp: , Rfl:  .  cyclobenzaprine (FLEXERIL) 10 MG tablet, Take 1 tablet (10 mg total) by mouth 3 (three) times daily as needed., Disp: 15 tablet, Rfl: 0 .  dicyclomine (BENTYL) 10 MG capsule, Take 10 mg by mouth 4 (four) times daily -  before meals and at bedtime., Disp: , Rfl:  .   Diphenhyd-Hydrocort-Nystatin (FIRST-DUKES MOUTHWASH MT), Reported on 10/19/2015, Disp: , Rfl: 6 .  DOCOSAHEXAENOIC ACID PO, Take by mouth., Disp: , Rfl:  .  Ginger, Zingiber officinalis, (GINGER ROOT) 550 MG CAPS, Take 550 mg by mouth 2 (two) times daily., Disp: , Rfl:  .  levothyroxine (SYNTHROID, LEVOTHROID) 25 MCG tablet, Take 25 mcg by mouth daily., Disp: , Rfl: 4 .  milk thistle 175 MG tablet, Take 175 mg by mouth 2 (two) times daily., Disp: , Rfl:  .  Multiple Vitamin (MULTI-VITAMINS) TABS, Take by mouth., Disp: , Rfl:  .  Omeprazole (RA OMEPRAZOLE) 20 MG TBEC, Take 20 mg by mouth. Reported on 10/19/2015, Disp: , Rfl:  .  oxyCODONE (OXY IR/ROXICODONE) 5 MG immediate release tablet, 5 mg BID- TID prn moderate to severe shoulder pain. Max 75/month, Disp: 75 tablet, Rfl: 0 .  promethazine (PHENERGAN) 25 MG tablet, Take 25 mg by mouth. Reported on 10/19/2015, Disp: , Rfl:  .  terbinafine (LAMISIL) 250 MG tablet, Take 1 tablet (250 mg total) by mouth daily., Disp: 90 tablet, Rfl: 0 .  tiZANidine (ZANAFLEX) 4 MG tablet, Take 4 mg by mouth every  6 (six) hours as needed for muscle spasms., Disp: , Rfl:  .  traMADol (ULTRAM) 50 MG tablet, Take by mouth every 6 (six) hours as needed. for pain, Disp: , Rfl: 3 .  Turmeric (CURCUMIN 95) 500 MG CAPS, Take 400 mg by mouth 2 (two) times daily., Disp: , Rfl:  .  valACYclovir (VALTREX) 500 MG tablet, Take 500 mg by mouth., Disp: , Rfl:  .  zinc gluconate 50 MG tablet, Take 50 mg by mouth daily., Disp: , Rfl:  .  oxyCODONE-acetaminophen (PERCOCET) 7.5-325 MG tablet, Take 1 tablet by mouth every 6 (six) hours as needed for severe pain. (Patient not taking: Reported on 07/05/2018), Disp: 12 tablet, Rfl: 0  ROS  Constitutional: Denies any fever or chills Gastrointestinal: No reported hemesis, hematochezia, vomiting, or acute GI distress Musculoskeletal: Denies any acute onset joint swelling, redness, loss of ROM, or weakness Neurological: No reported episodes of  acute onset apraxia, aphasia, dysarthria, agnosia, amnesia, paralysis, loss of coordination, or loss of consciousness  Allergies  Mr. Stiefel is allergic to vesicare [solifenacin].  [place]  Drug: Mr. Odem  reports that he does not use drugs. Alcohol:  reports that he does not drink alcohol. Tobacco:  reports that he has quit smoking. He has never used smokeless tobacco. Medical:  has a past medical history of Anxiety, Arthritis, BPH (benign prostatic hyperplasia), Bronchitis, Chronic constipation, Chronic pain, Depression, Hepatitis C, Herpes, History of pancreatitis, History of seizure disorder, Hypothyroidism, Memory impairment, Myoclonic jerking, Neck injury, Nocturia, SOB (shortness of breath), Trigger finger, and Urinary frequency. Surgical: Mr. Barton  has a past surgical history that includes Trigger finger release; Rotator cuff repair (Left); and Rotator cuff repair (Right). Family: family history includes Bladder Cancer in his mother; Breast cancer in his mother; Heart disease in his unknown relative; Kidney disease in his cousin and mother; Prostate cancer in his father.  Constitutional Exam  General appearance: Well nourished, well developed, and well hydrated. In no apparent acute distress Vitals:   07/05/18 1112  BP: 112/79  Pulse: 76  Resp: 16  Temp: 98.9 F (37.2 C)  TempSrc: Oral  SpO2: 99%  Weight: 180 lb (81.6 kg)  Height: '5\' 11"'$  (1.803 m)   BMI Assessment: Estimated body mass index is 25.1 kg/m as calculated from the following:   Height as of this encounter: '5\' 11"'$  (1.803 m).   Weight as of this encounter: 180 lb (81.6 kg).  BMI interpretation table: BMI level Category Range association with higher incidence of chronic pain  <18 kg/m2 Underweight   18.5-24.9 kg/m2 Ideal body weight   25-29.9 kg/m2 Overweight Increased incidence by 20%  30-34.9 kg/m2 Obese (Class I) Increased incidence by 68%  35-39.9 kg/m2 Severe obesity (Class II) Increased incidence by  136%  >40 kg/m2 Extreme obesity (Class III) Increased incidence by 254%   Patient's current BMI Ideal Body weight  Body mass index is 25.1 kg/m. Ideal body weight: 75.3 kg (166 lb 0.1 oz) Adjusted ideal body weight: 77.8 kg (171 lb 9.7 oz)   BMI Readings from Last 4 Encounters:  07/05/18 25.10 kg/m  05/29/18 24.07 kg/m  05/28/18 24.07 kg/m  05/15/18 25.10 kg/m   Wt Readings from Last 4 Encounters:  07/05/18 180 lb (81.6 kg)  05/29/18 172 lb 9.6 oz (78.3 kg)  05/28/18 172 lb 9.6 oz (78.3 kg)  05/15/18 180 lb (81.6 kg)  Psych/Mental status: Alert, oriented x 3 (person, place, & time)       Eyes: PERLA Respiratory: No evidence  of acute respiratory distress  Cervical Spine Area Exam  Skin & Axial Inspection: No masses, redness, edema, swelling, or associated skin lesions Alignment: Symmetrical Functional ROM: Unrestricted ROM      Stability: No instability detected Muscle Tone/Strength: Functionally intact. No obvious neuro-muscular anomalies detected. Sensory (Neurological): Unimpaired Palpation: No palpable anomalies              Upper Extremity (UE) Exam    Side: Right upper extremity  Side: Left upper extremity  Skin & Extremity Inspection: Skin color, temperature, and hair growth are WNL. No peripheral edema or cyanosis. No masses, redness, swelling, asymmetry, or associated skin lesions. No contractures.  Skin & Extremity Inspection: Skin color, temperature, and hair growth are WNL. No peripheral edema or cyanosis. No masses, redness, swelling, asymmetry, or associated skin lesions. No contractures.  Functional ROM: Unrestricted ROM          Functional ROM: Unrestricted ROM          Muscle Tone/Strength: Functionally intact. No obvious neuro-muscular anomalies detected.  Muscle Tone/Strength: Functionally intact. No obvious neuro-muscular anomalies detected.  Sensory (Neurological): Unimpaired          Sensory (Neurological): Unimpaired          Palpation: No palpable  anomalies              Palpation: No palpable anomalies              Provocative Test(s):  Phalen's test: deferred Tinel's test: deferred Apley's scratch test (touch opposite shoulder):  Action 1 (Across chest): deferred Action 2 (Overhead): deferred Action 3 (LB reach): deferred   Provocative Test(s):  Phalen's test: deferred Tinel's test: deferred Apley's scratch test (touch opposite shoulder):  Action 1 (Across chest): deferred Action 2 (Overhead): deferred Action 3 (LB reach): deferred    Thoracic Spine Area Exam  Skin & Axial Inspection: No masses, redness, or swelling Alignment: Symmetrical Functional ROM: Unrestricted ROM Stability: No instability detected Muscle Tone/Strength: Functionally intact. No obvious neuro-muscular anomalies detected. Sensory (Neurological): Unimpaired Muscle strength & Tone: No palpable anomalies  Lumbar Spine Area Exam  Skin & Axial Inspection: No masses, redness, or swelling Alignment: Symmetrical Functional ROM: Unrestricted ROM       Stability: No instability detected Muscle Tone/Strength: Functionally intact. No obvious neuro-muscular anomalies detected. Sensory (Neurological): Unimpaired Palpation: No palpable anomalies       Provocative Tests: Hyperextension/rotation test: deferred today       Lumbar quadrant test (Kemp's test): deferred today       Lateral bending test: deferred today       Patrick's Maneuver: deferred today                   FABER test: deferred today                   S-I anterior distraction/compression test: deferred today         S-I lateral compression test: deferred today         S-I Thigh-thrust test: deferred today         S-I Gaenslen's test: deferred today          Gait & Posture Assessment  Ambulation: Unassisted Gait: Relatively normal for age and body habitus Posture: WNL   Lower Extremity Exam    Side: Right lower extremity  Side: Left lower extremity  Stability: No instability observed           Stability: No instability observed  Skin & Extremity Inspection: Skin color, temperature, and hair growth are WNL. No peripheral edema or cyanosis. No masses, redness, swelling, asymmetry, or associated skin lesions. No contractures.  Skin & Extremity Inspection: Skin color, temperature, and hair growth are WNL. No peripheral edema or cyanosis. No masses, redness, swelling, asymmetry, or associated skin lesions. No contractures.  Functional ROM: Unrestricted ROM                  Functional ROM: Unrestricted ROM                  Muscle Tone/Strength: Functionally intact. No obvious neuro-muscular anomalies detected.  Muscle Tone/Strength: Functionally intact. No obvious neuro-muscular anomalies detected.  Sensory (Neurological): Unimpaired  Sensory (Neurological): Unimpaired  Palpation: No palpable anomalies  Palpation: No palpable anomalies   Assessment  Primary Diagnosis & Pertinent Problem List: The primary encounter diagnosis was Arthritis of left shoulder region. Diagnoses of History of rotator cuff surgery, Primary osteoarthritis of right shoulder, Chronic pain syndrome, Tear of right supraspinatus tendon, Cervicalgia, Cervical facet joint syndrome - R C3,4,5,6,7, and DDD (degenerative disc disease), cervical were also pertinent to this visit.  Status Diagnosis  Controlled Controlled Controlled 1. Arthritis of left shoulder region   2. History of rotator cuff surgery   3. Primary osteoarthritis of right shoulder   4. Chronic pain syndrome   5. Tear of right supraspinatus tendon   6. Cervicalgia   7. Cervical facet joint syndrome - R C3,4,5,6,7   8. DDD (degenerative disc disease), cervical     Problems updated and reviewed during this visit: No problems updated. Plan of Care  Pharmacotherapy (Medications Ordered): No orders of the defined types were placed in this encounter.  Lab-work, procedure(s), and/or referral(s): No orders of the defined types were placed in this  encounter.   Pharmacological management options:  Opioid Analgesics: We'll take over management today. See above orders Membrane stabilizer: We have discussed the possibility of optimizing this mode of therapy, if tolerated Muscle relaxant: We have discussed the possibility of a trial NSAID: We have discussed the possibility of a trial Other analgesic(s): To be determined at a later time   Interventional management options: Planned, scheduled, and/or pending:    ***   Considering:   ***   PRN Procedures:   To be determined at a later time   Provider-requested follow-up: Return in about 6 weeks (around 08/16/2018) for Medication Management.  No future appointments.  Primary Care Physician: Hayden Burrow, MD Location: [facility] Note by: Hayden Hall, M.D Date: 07/05/2018; Time: 11:48 AM  There are no Patient Instructions on file for this visit.

## 2018-07-05 NOTE — Progress Notes (Signed)
Patient's Name: Hayden Hall  MRN: [id number]  Referring Provider: Theotis Hall*  DOB: [birth year]  PCP: Hayden Burrow, MD  DOS: 07/05/2018  Note by: Hayden Santa, MD  Service setting: Ambulatory outpatient  Specialty: Interventional Pain Management  Location: [facility] (AMB) Pain Management Facility    Patient type: Established   Primary Reason(s) for Visit: Evaluation of chronic illnesses with exacerbation, or progression (Level of risk: moderate) CC: Foot Pain (bilaterally)  HPI  Hayden Hall is a 62 y.o. year old, male patient, who comes today for a follow-up evaluation. He has BPH with obstruction/lower urinary tract symptoms; Cervicalgia; History of rotator cuff surgery; Cervical radiculopathy; DDD (degenerative disc disease), cervical; Chronic pain syndrome; Primary osteoarthritis of right shoulder; and Osteoarthritis of spine with radiculopathy, cervical region on their problem list. Hayden Hall was last seen on 05/28/2018. His primarily concern today is the Foot Pain (bilaterally)  Pain Assessment: Location: Right, Left Foot Radiating: right foot pain radiates up to ankle; left foot pain radiates up to sacral area Onset: More than a month ago Duration: Chronic pain Quality: Constant, Shooting Severity: 10-Worst pain ever/10 (subjective, self-reported pain score)  Note: Clear symptom exaggeration. Reported level of pain is not compatible with clinical observations.                         When using our objective Pain Scale, levels between 6 and 10/10 are said to belong in an emergency room, as it progressively worsens from a 6/10, described as severely limiting, requiring emergency care not usually available at an outpatient pain management facility. At a 6/10 level, communication becomes difficult and requires great effort. Assistance to reach the emergency department may be required. Facial flushing and profuse sweating along with potentially dangerous increases in  heart rate and blood pressure will be evident. Effect on ADL:   Timing: Constant Modifying factors: meds "knock the edge off" BP: 112/79  HR: 76  Further details on both, my assessment(s), as well as the proposed treatment plan, please see below.  Patient follows up today to inform me that he has a surgery date scheduled for his left shoulder arthroscopic revision surgery.  Patient has been utilizing oxycodone 5 mg twice daily to 3 times daily as needed.  He still has one prescription remaining that I provided him on 05/15/2018.  I informed the patient that when his current prescription runs out he can go to the pharmacy to pick up the second prescription that should be ready.  In regards to perioperative pain management, instructed the patient to try and decrease his opioid intake to the lowest amount possible so that we can reduce his tolerance and have these medications hopefully be more effective in the postoperative period.  Furthermore, I have instructed the patient to continue his chronic opioid medications that I have prescribed him to take.  Patient's orthopedic surgeon will be responsible for his acute postoperative pain and patient can take any postoperative opioids that surgeon prescribes on top of his chronic opioid therapy.  We also discussed perioperative pain management  in the context of his chronic pain syndrome.  I recommend the patient  request an interscalene nerve block prior to his left shoulder surgery which will help out with intraoperative and postoperative pain and minimize opioid reliance for pain control.  Patient may also want to consider going home with a pain ball that would help out with postoperative pain.  Furthermore anesthesia care team may want to  consider intraoperative ketamine infusion to help out with intraoperative and postoperative pain.  Laboratory Chemistry  Inflammation Markers (CRP: Acute Phase) (ESR: Chronic Phase) No results found for: CRP,  ESRSEDRATE, LATICACIDVEN                       Rheumatology Markers No results found for: RF, ANA, LABURIC, URICUR, LYMEIGGIGMAB, LYMEABIGMQN, HLAB27                      Renal Function Markers Lab Results  Component Value Date   BUN 26 (H) 05/17/2017   CREATININE 0.86 05/17/2017   GFRAA >60 05/17/2017   GFRNONAA >60 05/17/2017                             Hepatic Function Markers Lab Results  Component Value Date   AST 18 04/27/2018   ALT 13 04/27/2018   ALBUMIN 4.8 04/27/2018   ALKPHOS 46 04/27/2018   LIPASE 107 (H) 05/17/2017                        Electrolytes Lab Results  Component Value Date   NA 138 05/17/2017   K 4.3 05/17/2017   CL 105 05/17/2017   CALCIUM 9.6 05/17/2017                        Neuropathy Markers No results found for: VITAMINB12, FOLATE, HGBA1C, HIV                      CNS Tests No results found for: COLORCSF, APPEARCSF, RBCCOUNTCSF, WBCCSF, POLYSCSF, LYMPHSCSF, EOSCSF, PROTEINCSF, GLUCCSF, JCVIRUS, CSFOLI, IGGCSF                      Bone Pathology Markers No results found for: VD25OH, [id number], [id number], LD3570VX7, 25OHVITD1, 25OHVITD2, 25OHVITD3, TESTOFREE, TESTOSTERONE                       Coagulation Parameters Lab Results  Component Value Date   PLT 275 05/17/2017                        Cardiovascular Markers Lab Results  Component Value Date   TROPONINI <0.03 12/03/2016   HGB 13.7 05/17/2017   HCT 40.7 05/17/2017                         CA Markers No results found for: CEA, CA125, LABCA2                      Note: Lab results reviewed.  Recent Diagnostic Imaging Review  Cervical Imaging: Cervical MR wo contrast:  Results for orders placed during the hospital encounter of 12/28/17  MR CERVICAL SPINE WO CONTRAST   Narrative CLINICAL DATA:  Neck pain since the 1970s, worse over the past 15 years. Pain radiates down the right shoulder  EXAM: MRI CERVICAL SPINE WITHOUT CONTRAST  TECHNIQUE: Multiplanar,  multisequence MR imaging of the cervical spine was performed. No intravenous contrast was administered.  COMPARISON:  11/09/2016  FINDINGS: Alignment: Slight C3-4 and C7-T1 anterolisthesis  Vertebrae: No fracture, evidence of discitis, or bone lesion.  Cord: Normal signal and morphology.  Posterior Fossa, vertebral arteries, paraspinal tissues: Negative.  Disc levels:  C2-3: Mild facet spurring.  No impingement  C3-4: Disc narrowing with bulky rightward endplate and uncovertebral spurring. Asymmetric rightward degenerative facet spurring. High-grade right foraminal stenosis. Patent canal and left foramen  C4-5: Advanced degenerative disc narrowing with posterior disc osteophyte complex effacing the ventral subarachnoid space and contacting the ventral cord. Asymmetric rightward facet spurring. Left more than right uncovertebral spurring. Left foraminal impingement is high-grade. Patent right foramen  C5-6: Advanced degenerative disc narrowing with posterior disc osteophyte complex effacing the ventral subarachnoid space. Biforaminal impingement from disc height loss and uncovertebral spurring  C6-7: Advanced degenerative disc narrowing with right preferential disc bulging and endplate spurring. Right uncovertebral ridging. Moderate right foraminal narrowing. Patent left foramen. Ventral subarachnoid space effacement without cord compression  C7-T1:Facet arthropathy with slight anterolisthesis. Tiny central disc protrusion without neural contact.  IMPRESSION: 1. Advanced disc degeneration with right-sided C3-4/C4-5 facet arthropathy. No noted progression since 11/09/2016. 2. Right foraminal impingement at C3-4, C5-6, and to a lesser extent at C6-7. 3. Left foraminal impingement at C4-5 and C5-6. 4. Mild, noncompressive spinal stenosis at C4-5 and C5-6.   Electronically Signed   By: Monte Fantasia M.D.   On: 12/28/2017 09:24     Shoulder-R MR wo contrast:   Results for orders placed during the hospital encounter of 12/28/17  MR SHOULDER RIGHT WO CONTRAST   Narrative CLINICAL DATA:  Right shoulder pain for 2-3 years. Prior rotator cuff surgery 2016.  EXAM: MRI OF THE RIGHT SHOULDER WITHOUT CONTRAST  TECHNIQUE: Multiplanar, multisequence MR imaging of the shoulder was performed. No intravenous contrast was administered.  COMPARISON:  None.  FINDINGS: Rotator cuff: Large full-thickness retracted supraspinatus tendon tear. Maximum retraction is 3.5 cm. The infraspinatus and subscapularis tendons are intact.  Muscles: Retraction and minimal fatty atrophy of the supraspinatus muscle.  Biceps long head: Significant tendinopathy involving the intra-articular portion.  Acromioclavicular Joint: Moderate degenerative changes type 1-2 acromion. No lateral downsloping or undersurface spurring.  Glenohumeral Joint: Moderate degenerative changes. Small joint effusion. There is thickening of the capsular structures in the axillary recess which can be seen with adhesive capsulitis or synovitis.  Labrum:  Labral degenerative changes without obvious tear.  Bones:  No acute bony findings.  Other: Expected fluid in the subacromial/subdeltoid bursa.  IMPRESSION: 1. Large full-thickness retracted supraspinatus tendon tear. Retraction and minimal fatty atrophy of the supraspinatus muscle. The infraspinatus and subscapularis tendons are intact. 2. Significant tendinopathy involving the intra-articular portion of the long head biceps tendon. 3. No findings for bony impingement. 4. Labral degenerative changes without obvious tear. 5. Moderate glenohumeral joint degenerative changes.   Electronically Signed   By: Marijo Sanes M.D.   On: 12/28/2017 09:22    Shoulder-R DG:  Results for orders placed during the hospital encounter of 05/22/18  DG Shoulder Right   Narrative CLINICAL DATA:  Right shoulder pain. History of rotator cuff  surgery  EXAM: RIGHT SHOULDER - 2+ VIEW  COMPARISON:  Right shoulder MRI 12/28/2017  FINDINGS: Normal alignment. Negative for fracture. Mild AC degenerative change.  IMPRESSION: Mild AC degeneration.   Electronically Signed   By: Franchot Gallo M.D.   On: 05/22/2018 14:48    Shoulder-L DG:  Results for orders placed during the hospital encounter of 05/22/18  DG Shoulder Left   Narrative CLINICAL DATA:  Left shoulder pain.  Rotator cuff surgery  EXAM: LEFT SHOULDER - 2+ VIEW  COMPARISON:  06/20/2014  FINDINGS: There is no evidence of fracture or dislocation. There is no evidence of arthropathy or other focal bone abnormality.  Soft tissues are unremarkable.  IMPRESSION: Negative.   Electronically Signed   By: Franchot Gallo M.D.   On: 05/22/2018 14:46     Foot-R DG Complete:  Results for orders placed in visit on 02/19/18  DG Foot Complete Right   Narrative Please see detailed radiograph report in office note.    Complexity Note: Imaging results reviewed. Results shared with Mr. Asfaw, using Layman's terms.                         Meds   Current Outpatient Medications:  .  Ascorbic Acid (VITAMIN C PO), Take by mouth., Disp: , Rfl:  .  Bilberry 1000 MG CAPS, Take 1,000 mg by mouth 2 (two) times daily., Disp: , Rfl:  .  Cholecalciferol (VITAMIN D3) 2000 units TABS, Take 2,000 Units by mouth 2 (two) times daily., Disp: , Rfl:  .  Cyanocobalamin (VITAMIN B 12 PO), Take by mouth., Disp: , Rfl:  .  cyclobenzaprine (FLEXERIL) 10 MG tablet, Take 1 tablet (10 mg total) by mouth 3 (three) times daily as needed., Disp: 15 tablet, Rfl: 0 .  dicyclomine (BENTYL) 10 MG capsule, Take 10 mg by mouth 4 (four) times daily -  before meals and at bedtime., Disp: , Rfl:  .  Diphenhyd-Hydrocort-Nystatin (FIRST-DUKES MOUTHWASH MT), Reported on 10/19/2015, Disp: , Rfl: 6 .  DOCOSAHEXAENOIC ACID PO, Take by mouth., Disp: , Rfl:  .  Ginger, Zingiber officinalis, (GINGER ROOT)  550 MG CAPS, Take 550 mg by mouth 2 (two) times daily., Disp: , Rfl:  .  levothyroxine (SYNTHROID, LEVOTHROID) 25 MCG tablet, Take 25 mcg by mouth daily., Disp: , Rfl: 4 .  milk thistle 175 MG tablet, Take 175 mg by mouth 2 (two) times daily., Disp: , Rfl:  .  Multiple Vitamin (MULTI-VITAMINS) TABS, Take by mouth., Disp: , Rfl:  .  Omeprazole (RA OMEPRAZOLE) 20 MG TBEC, Take 20 mg by mouth. Reported on 10/19/2015, Disp: , Rfl:  .  oxyCODONE (OXY IR/ROXICODONE) 5 MG immediate release tablet, 5 mg BID- TID prn moderate to severe shoulder pain. Max 75/month, Disp: 75 tablet, Rfl: 0 .  promethazine (PHENERGAN) 25 MG tablet, Take 25 mg by mouth. Reported on 10/19/2015, Disp: , Rfl:  .  terbinafine (LAMISIL) 250 MG tablet, Take 1 tablet (250 mg total) by mouth daily., Disp: 90 tablet, Rfl: 0 .  tiZANidine (ZANAFLEX) 4 MG tablet, Take 4 mg by mouth every 6 (six) hours as needed for muscle spasms., Disp: , Rfl:  .  traMADol (ULTRAM) 50 MG tablet, Take by mouth every 6 (six) hours as needed. for pain, Disp: , Rfl: 3 .  Turmeric (CURCUMIN 95) 500 MG CAPS, Take 400 mg by mouth 2 (two) times daily., Disp: , Rfl:  .  valACYclovir (VALTREX) 500 MG tablet, Take 500 mg by mouth., Disp: , Rfl:  .  zinc gluconate 50 MG tablet, Take 50 mg by mouth daily., Disp: , Rfl:  .  oxyCODONE-acetaminophen (PERCOCET) 7.5-325 MG tablet, Take 1 tablet by mouth every 6 (six) hours as needed for severe pain. (Patient not taking: Reported on 07/05/2018), Disp: 12 tablet, Rfl: 0  ROS  Constitutional: Denies any fever or chills Gastrointestinal: No reported hemesis, hematochezia, vomiting, or acute GI distress Musculoskeletal: Denies any acute onset joint swelling, redness, loss of ROM, or weakness Neurological: No reported episodes of acute onset apraxia, aphasia, dysarthria, agnosia, amnesia, paralysis, loss of coordination, or loss of consciousness  Allergies  Mr. Gudiel  is allergic to vesicare [solifenacin].  [place]  Drug: Mr.  Debow  reports that he does not use drugs. Alcohol:  reports that he does not drink alcohol. Tobacco:  reports that he has quit smoking. He has never used smokeless tobacco. Medical:  has a past medical history of Anxiety, Arthritis, BPH (benign prostatic hyperplasia), Bronchitis, Chronic constipation, Chronic pain, Depression, Hepatitis C, Herpes, History of pancreatitis, History of seizure disorder, Hypothyroidism, Memory impairment, Myoclonic jerking, Neck injury, Nocturia, SOB (shortness of breath), Trigger finger, and Urinary frequency. Surgical: Mr. Cauble  has a past surgical history that includes Trigger finger release; Rotator cuff repair (Left); and Rotator cuff repair (Right). Family: family history includes Bladder Cancer in his mother; Breast cancer in his mother; Heart disease in his unknown relative; Kidney disease in his cousin and mother; Prostate cancer in his father.  Constitutional Exam  General appearance: Well nourished, well developed, and well hydrated. In no apparent acute distress Vitals:   07/05/18 1112  BP: 112/79  Pulse: 76  Resp: 16  Temp: 98.9 F (37.2 C)  TempSrc: Oral  SpO2: 99%  Weight: 180 lb (81.6 kg)  Height: '5\' 11"'$  (1.803 m)   BMI Assessment: Estimated body mass index is 25.1 kg/m as calculated from the following:   Height as of this encounter: '5\' 11"'$  (1.803 m).   Weight as of this encounter: 180 lb (81.6 kg).  BMI interpretation table: BMI level Category Range association with higher incidence of chronic pain  <18 kg/m2 Underweight   18.5-24.9 kg/m2 Ideal body weight   25-29.9 kg/m2 Overweight Increased incidence by 20%  30-34.9 kg/m2 Obese (Class I) Increased incidence by 68%  35-39.9 kg/m2 Severe obesity (Class II) Increased incidence by 136%  >40 kg/m2 Extreme obesity (Class III) Increased incidence by 254%   Patient's current BMI Ideal Body weight  Body mass index is 25.1 kg/m. Ideal body weight: 75.3 kg (166 lb 0.1 oz) Adjusted  ideal body weight: 77.8 kg (171 lb 9.7 oz)   BMI Readings from Last 4 Encounters:  07/05/18 25.10 kg/m  05/29/18 24.07 kg/m  05/28/18 24.07 kg/m  05/15/18 25.10 kg/m   Wt Readings from Last 4 Encounters:  07/05/18 180 lb (81.6 kg)  05/29/18 172 lb 9.6 oz (78.3 kg)  05/28/18 172 lb 9.6 oz (78.3 kg)  05/15/18 180 lb (81.6 kg)  Psych/Mental status: Alert, oriented x 3 (person, place, & time)       Eyes: PERLA Respiratory: No evidence of acute respiratory distress  Cervical Spine Area Exam  Skin & Axial Inspection: No masses, redness, edema, swelling, or associated skin lesions Alignment: Symmetrical Functional ROM: Decreased ROM, bilaterally Stability: No instability detected Muscle Tone/Strength: Functionally intact. No obvious neuro-muscular anomalies detected. Sensory (Neurological): Musculoskeletal pain pattern Palpation: Complains of area being tender to palpation              Upper Extremity (UE) Exam    Side: Right upper extremity  Side: Left upper extremity  Skin & Extremity Inspection: Skin color, temperature, and hair growth are WNL. No peripheral edema or cyanosis. No masses, redness, swelling, asymmetry, or associated skin lesions. No contractures.  Skin & Extremity Inspection: Skin color, temperature, and hair growth are WNL. No peripheral edema or cyanosis. No masses, redness, swelling, asymmetry, or associated skin lesions. No contractures.  Functional ROM: Decreased ROM for shoulder  Functional ROM: Decreased ROM for shoulder  Muscle Tone/Strength: Functionally intact. No obvious neuro-muscular anomalies detected.  Muscle Tone/Strength: Functionally intact. No obvious neuro-muscular anomalies detected.  Sensory (Neurological): Musculoskeletal pain pattern          Sensory (Neurological): Musculoskeletal pain pattern          Palpation: No palpable anomalies              Palpation: No palpable anomalies              Provocative Test(s):  Phalen's test:  deferred Tinel's test: deferred Apley's scratch test (touch opposite shoulder):  Action 1 (Across chest): Decreased ROM Action 2 (Overhead): Decreased ROM Action 3 (LB reach): deferred   Provocative Test(s):  Phalen's test: deferred Tinel's test: deferred Apley's scratch test (touch opposite shoulder):  Action 1 (Across chest): Decreased ROM Action 2 (Overhead): Decreased ROM Action 3 (LB reach): deferred    Thoracic Spine Area Exam  Skin & Axial Inspection: No masses, redness, or swelling Alignment: Symmetrical Functional ROM: Unrestricted ROM Stability: No instability detected Muscle Tone/Strength: Functionally intact. No obvious neuro-muscular anomalies detected. Sensory (Neurological): Unimpaired Muscle strength & Tone: No palpable anomalies  Lumbar Spine Area Exam  Skin & Axial Inspection: No masses, redness, or swelling Alignment: Symmetrical Functional ROM: Decreased ROM affecting both sides Stability: No instability detected Muscle Tone/Strength: Functionally intact. No obvious neuro-muscular anomalies detected. Sensory (Neurological): Articular pain pattern and MSK Palpation: No palpable anomalies       Provocative Tests: Hyperextension/rotation test: deferred today       Lumbar quadrant test (Kemp's test): deferred today       Lateral bending test: deferred today       Patrick's Maneuver: deferred today                   FABER test: deferred today                   S-I anterior distraction/compression test: deferred today         S-I lateral compression test: deferred today         S-I Thigh-thrust test: deferred today         S-I Gaenslen's test: deferred today          Gait & Posture Assessment  Ambulation: Unassisted Gait: Relatively normal for age and body habitus Posture: WNL   Lower Extremity Exam    Side: Right lower extremity  Side: Left lower extremity  Stability: No instability observed          Stability: No instability observed          Skin &  Extremity Inspection: Skin color, temperature, and hair growth are WNL. No peripheral edema or cyanosis. No masses, redness, swelling, asymmetry, or associated skin lesions. No contractures.  Skin & Extremity Inspection: Skin color, temperature, and hair growth are WNL. No peripheral edema or cyanosis. No masses, redness, swelling, asymmetry, or associated skin lesions. No contractures.  Functional ROM: Unrestricted ROM                  Functional ROM: Unrestricted ROM                  Muscle Tone/Strength: Functionally intact. No obvious neuro-muscular anomalies detected.  Muscle Tone/Strength: Functionally intact. No obvious neuro-muscular anomalies detected.  Sensory (Neurological): Unimpaired  Sensory (Neurological): Unimpaired  Palpation: No palpable anomalies  Palpation: No palpable anomalies   Assessment  Primary Diagnosis & Pertinent Problem List: The primary encounter diagnosis was Arthritis of left shoulder region. Diagnoses of History of rotator cuff surgery, Primary osteoarthritis of right shoulder, Chronic pain syndrome, Tear of  right supraspinatus tendon, Cervicalgia, Cervical facet joint syndrome - R C3,4,5,6,7, and DDD (degenerative disc disease), cervical were also pertinent to this visit.  Status Diagnosis  Persistent Persistent Persistent 1. Arthritis of left shoulder region   2. History of rotator cuff surgery   3. Primary osteoarthritis of right shoulder   4. Chronic pain syndrome   5. Tear of right supraspinatus tendon   6. Cervicalgia   7. Cervical facet joint syndrome - R C3,4,5,6,7   8. DDD (degenerative disc disease), cervical      Patient follows up today to inform me that he has a surgery date scheduled for his left shoulder arthroscopic revision surgery.  Patient has been utilizing oxycodone 5 mg twice daily to 3 times daily as needed.  He still has one prescription remaining that I provided him on 05/15/2018.  I informed the patient that when his current  prescription runs out he can go to the pharmacy to pick up the second prescription that should be ready.  In regards to perioperative pain management, I instructed the patient to try and decrease his opioid intake to the lowest amount possible so that we can reduce his tolerance and have these medications hopefully be more effective in the postoperative period.  Furthermore, I have instructed the patient to continue his chronic opioid medications that I have prescribed him to take after surgery.  Patient's orthopedic surgeon will be responsible for his acute postoperative pain and patient can take any postoperative opioids that surgeon prescribes on top of his chronic opioid therapy.  We also discussed perioperative pain management in the context of his chronic pain syndrome.  I recommend the patient  request an interscalene nerve block prior to his left shoulder surgery which will help out with intraoperative and postoperative pain and minimize opioid reliance for pain control.  Patient may also want to consider going home with a pain ball that would help out with postoperative pain.  Furthermore anesthesia care team may want to consider intraoperative ketamine infusion to help out with intraoperative and postoperative pain.   Provider-requested follow-up: Return in about 6 weeks (around 08/16/2018) for Medication Management.   Primary Care Physician: Hayden Burrow, MD Location: [facility] Note by: Hayden Hall, M.D Date: 07/05/2018; Time: 11:49 AM  There are no Patient Instructions on file for this visit.

## 2018-07-05 NOTE — Progress Notes (Signed)
Nursing Pain Medication Assessment:  Safety precautions to be maintained throughout the outpatient stay will include: orient to surroundings, keep bed in low position, maintain call bell within reach at all times, provide assistance with transfer out of bed and ambulation.  Medication Inspection Compliance: Pill count conducted under aseptic conditions, in front of the patient. Neither the pills nor the bottle was removed from the patient's sight at any time. Once count was completed pills were immediately returned to the patient in their original bottle.  Medication: Oxycodone IR Pill/Patch Count: 17 of 75 pills remain Pill/Patch Appearance: Markings consistent with prescribed medication Bottle Appearance: Standard pharmacy container. Clearly labeled. Filled Date: 8 / 30 / 2019 Last Medication intake:  Day before yesterday

## 2018-07-11 ENCOUNTER — Other Ambulatory Visit: Payer: Self-pay

## 2018-07-11 ENCOUNTER — Encounter (HOSPITAL_COMMUNITY): Payer: Self-pay | Admitting: *Deleted

## 2018-07-11 NOTE — Progress Notes (Signed)
Pt denies SOB and chest pain. Pt under the care of Dr. Clayborn Bigness, Cardiology. Pt stated that he has not had a seizure in > 10 years. Pt denies having an EKG within the last year. Pt denies recent labs. Pt stated that he does not take Aspirin. Pt made aware to stop taking vitamins, fish oil, Melatonin, Turmeric, Milk Thistle and herbal medications. Do not take any NSAIDs ie: Ibuprofen, Advil, Naproxen (Aleve), Motrin, BC and Goody Powder. Pt verbalized understanding of all pre-op instructions. See anesthesia note.

## 2018-07-12 ENCOUNTER — Ambulatory Visit (HOSPITAL_COMMUNITY): Payer: Medicaid Other | Admitting: Physician Assistant

## 2018-07-12 ENCOUNTER — Encounter (HOSPITAL_COMMUNITY): Payer: Self-pay | Admitting: *Deleted

## 2018-07-12 ENCOUNTER — Encounter (HOSPITAL_COMMUNITY)
Admission: RE | Disposition: A | Discharge status: Home / Self Care | Payer: Self-pay | Source: Ambulatory Visit | Attending: Orthopedic Surgery

## 2018-07-12 ENCOUNTER — Ambulatory Visit (HOSPITAL_COMMUNITY)
Admission: RE | Admit: 2018-07-12 | Discharge: 2018-07-12 | Disposition: A | Discharge status: Home / Self Care | Payer: Medicaid Other | Source: Ambulatory Visit | Attending: Orthopedic Surgery | Admitting: Orthopedic Surgery

## 2018-07-12 ENCOUNTER — Encounter: Payer: Medicaid Other | Admitting: Student in an Organized Health Care Education/Training Program

## 2018-07-12 ENCOUNTER — Other Ambulatory Visit: Payer: Self-pay

## 2018-07-12 DIAGNOSIS — J449 Chronic obstructive pulmonary disease, unspecified: Secondary | ICD-10-CM | POA: Diagnosis not present

## 2018-07-12 DIAGNOSIS — M069 Rheumatoid arthritis, unspecified: Secondary | ICD-10-CM | POA: Diagnosis not present

## 2018-07-12 DIAGNOSIS — M7542 Impingement syndrome of left shoulder: Secondary | ICD-10-CM | POA: Insufficient documentation

## 2018-07-12 DIAGNOSIS — Z8619 Personal history of other infectious and parasitic diseases: Secondary | ICD-10-CM | POA: Diagnosis not present

## 2018-07-12 DIAGNOSIS — E039 Hypothyroidism, unspecified: Secondary | ICD-10-CM | POA: Insufficient documentation

## 2018-07-12 DIAGNOSIS — Z79891 Long term (current) use of opiate analgesic: Secondary | ICD-10-CM | POA: Diagnosis not present

## 2018-07-12 DIAGNOSIS — Z87891 Personal history of nicotine dependence: Secondary | ICD-10-CM | POA: Diagnosis not present

## 2018-07-12 DIAGNOSIS — K219 Gastro-esophageal reflux disease without esophagitis: Secondary | ICD-10-CM | POA: Insufficient documentation

## 2018-07-12 DIAGNOSIS — G8929 Other chronic pain: Secondary | ICD-10-CM | POA: Insufficient documentation

## 2018-07-12 DIAGNOSIS — Z79890 Hormone replacement therapy (postmenopausal): Secondary | ICD-10-CM | POA: Diagnosis not present

## 2018-07-12 DIAGNOSIS — Z79899 Other long term (current) drug therapy: Secondary | ICD-10-CM | POA: Insufficient documentation

## 2018-07-12 DIAGNOSIS — M75122 Complete rotator cuff tear or rupture of left shoulder, not specified as traumatic: Secondary | ICD-10-CM | POA: Diagnosis present

## 2018-07-12 HISTORY — DX: Pneumonia, unspecified organism: J18.9

## 2018-07-12 HISTORY — DX: Headache, unspecified: R51.9

## 2018-07-12 HISTORY — DX: Dry eye syndrome of unspecified lacrimal gland: H04.129

## 2018-07-12 HISTORY — DX: Presence of spectacles and contact lenses: Z97.3

## 2018-07-12 HISTORY — DX: Gastro-esophageal reflux disease without esophagitis: K21.9

## 2018-07-12 HISTORY — DX: Unspecified rotator cuff tear or rupture of unspecified shoulder, not specified as traumatic: M75.100

## 2018-07-12 HISTORY — PX: SHOULDER ARTHROSCOPY: SHX128

## 2018-07-12 HISTORY — DX: Chronic obstructive pulmonary disease, unspecified: J44.9

## 2018-07-12 HISTORY — DX: Unspecified asthma, uncomplicated: J45.909

## 2018-07-12 HISTORY — DX: Schizophrenia, unspecified: F20.9

## 2018-07-12 HISTORY — DX: Unspecified cataract: H26.9

## 2018-07-12 HISTORY — DX: Bipolar disorder, unspecified: F31.9

## 2018-07-12 HISTORY — DX: Headache: R51

## 2018-07-12 LAB — BASIC METABOLIC PANEL
Anion gap: 10 (ref 5–15)
BUN: 11 mg/dL (ref 8–23)
CALCIUM: 9.5 mg/dL (ref 8.9–10.3)
CHLORIDE: 108 mmol/L (ref 98–111)
CO2: 25 mmol/L (ref 22–32)
Creatinine, Ser: 0.94 mg/dL (ref 0.61–1.24)
GFR calc non Af Amer: >60 mL/min (ref >60)
GLUCOSE: 86 mg/dL (ref 70–99)
POTASSIUM: 3.9 mmol/L (ref 3.5–5.1)
Sodium: 143 mmol/L (ref 135–145)

## 2018-07-12 LAB — CBC
HEMATOCRIT: 40.5 % (ref 39.0–52.0)
HEMOGLOBIN: 13.6 g/dL (ref 13.0–17.0)
MCH: 31.3 pg (ref 26.0–34.0)
MCHC: 33.6 g/dL (ref 30.0–36.0)
MCV: 93.1 fL (ref 80.0–100.0)
Platelets: 229 10*3/uL (ref 150–400)
RBC: 4.35 MIL/uL (ref 4.22–5.81)
RDW: 12.1 % (ref 11.5–15.5)
WBC: 3.7 10*3/uL — ABNORMAL LOW (ref 4.0–10.5)
nRBC: 0 % (ref 0.0–0.2)

## 2018-07-12 SURGERY — ARTHROSCOPY, SHOULDER
Anesthesia: General | Site: Shoulder | Laterality: Left

## 2018-07-12 MED ORDER — KETAMINE HCL 50 MG/5ML IJ SOSY
PREFILLED_SYRINGE | INTRAMUSCULAR | Status: AC
  Filled 2018-07-12: qty 5

## 2018-07-12 MED ORDER — OXYCODONE-ACETAMINOPHEN 5-325 MG PO TABS
ORAL_TABLET | ORAL | Status: AC
  Filled 2018-07-12: qty 1

## 2018-07-12 MED ORDER — FENTANYL CITRATE (PF) 100 MCG/2ML IJ SOLN
INTRAMUSCULAR | Status: AC
  Filled 2018-07-12: qty 2

## 2018-07-12 MED ORDER — PROPOFOL 10 MG/ML IV BOLUS
INTRAVENOUS | Status: AC
  Filled 2018-07-12: qty 20

## 2018-07-12 MED ORDER — PROPOFOL 10 MG/ML IV BOLUS
INTRAVENOUS | Status: DC | PRN
  Administered 2018-07-12: 140 mg via INTRAVENOUS

## 2018-07-12 MED ORDER — LIDOCAINE 2% (20 MG/ML) 5 ML SYRINGE
INTRAMUSCULAR | Status: DC | PRN
  Administered 2018-07-12: 40 mg via INTRAVENOUS

## 2018-07-12 MED ORDER — LACTATED RINGERS IV SOLN
INTRAVENOUS | Status: DC
  Administered 2018-07-12 (×2): via INTRAVENOUS

## 2018-07-12 MED ORDER — HYDROMORPHONE HCL 1 MG/ML IJ SOLN: 0.2500 mg | INTRAMUSCULAR | Status: DC | PRN

## 2018-07-12 MED ORDER — ONDANSETRON HCL 4 MG/2ML IJ SOLN
INTRAMUSCULAR | Status: DC | PRN
  Administered 2018-07-12: 4 mg via INTRAVENOUS

## 2018-07-12 MED ORDER — POVIDONE-IODINE 7.5 % EX SOLN
Freq: Once | CUTANEOUS | Status: DC
  Filled 2018-07-12: qty 118

## 2018-07-12 MED ORDER — KETAMINE HCL 10 MG/ML IJ SOLN
INTRAMUSCULAR | Status: DC | PRN
  Administered 2018-07-12 (×2): 10 mg via INTRAVENOUS

## 2018-07-12 MED ORDER — ROCURONIUM BROMIDE 100 MG/10ML IV SOLN
INTRAVENOUS | Status: DC | PRN
  Administered 2018-07-12: 50 mg via INTRAVENOUS
  Administered 2018-07-12: 10 mg via INTRAVENOUS

## 2018-07-12 MED ORDER — PHENYLEPHRINE 40 MCG/ML (10ML) SYRINGE FOR IV PUSH (FOR BLOOD PRESSURE SUPPORT)
PREFILLED_SYRINGE | INTRAVENOUS | Status: AC
  Filled 2018-07-12: qty 10

## 2018-07-12 MED ORDER — FENTANYL CITRATE (PF) 100 MCG/2ML IJ SOLN
50.0000 ug | Freq: Once | INTRAMUSCULAR | Status: AC
  Administered 2018-07-12: 50 ug via INTRAVENOUS

## 2018-07-12 MED ORDER — FENTANYL CITRATE (PF) 250 MCG/5ML IJ SOLN
INTRAMUSCULAR | Status: AC
  Filled 2018-07-12: qty 5

## 2018-07-12 MED ORDER — OXYCODONE-ACETAMINOPHEN 5-325 MG PO TABS: 1.0000 | ORAL_TABLET | ORAL | 0 refills | Status: DC | PRN

## 2018-07-12 MED ORDER — CEFAZOLIN SODIUM-DEXTROSE 2-4 GM/100ML-% IV SOLN
2.0000 g | INTRAVENOUS | Status: AC
  Administered 2018-07-12: 2 g via INTRAVENOUS
  Filled 2018-07-12: qty 100

## 2018-07-12 MED ORDER — DEXAMETHASONE SODIUM PHOSPHATE 10 MG/ML IJ SOLN
INTRAMUSCULAR | Status: DC | PRN
  Administered 2018-07-12: 10 mg via INTRAVENOUS

## 2018-07-12 MED ORDER — OXYCODONE-ACETAMINOPHEN 5-325 MG PO TABS
1.0000 | ORAL_TABLET | Freq: Once | ORAL | Status: AC
  Administered 2018-07-12: 1 via ORAL

## 2018-07-12 MED ORDER — SODIUM CHLORIDE 0.9 % IV SOLN
INTRAVENOUS | Status: DC | PRN
  Administered 2018-07-12: 25 ug/min via INTRAVENOUS

## 2018-07-12 MED ORDER — BUPIVACAINE HCL (PF) 0.5 % IJ SOLN
INTRAMUSCULAR | Status: DC | PRN
  Administered 2018-07-12: 15 mL via PERINEURAL

## 2018-07-12 MED ORDER — FENTANYL CITRATE (PF) 100 MCG/2ML IJ SOLN
INTRAMUSCULAR | Status: DC | PRN
  Administered 2018-07-12: 50 ug via INTRAVENOUS

## 2018-07-12 MED ORDER — GLYCOPYRROLATE 0.2 MG/ML IJ SOLN
INTRAMUSCULAR | Status: DC | PRN
  Administered 2018-07-12: 0.2 mg via INTRAVENOUS

## 2018-07-12 MED ORDER — MIDAZOLAM HCL 2 MG/2ML IJ SOLN
INTRAMUSCULAR | Status: AC
  Filled 2018-07-12: qty 2

## 2018-07-12 MED ORDER — MIDAZOLAM HCL 2 MG/2ML IJ SOLN
2.0000 mg | Freq: Once | INTRAMUSCULAR | Status: AC
  Administered 2018-07-12: 2 mg via INTRAVENOUS

## 2018-07-12 MED ORDER — SUGAMMADEX SODIUM 200 MG/2ML IV SOLN
INTRAVENOUS | Status: DC | PRN
  Administered 2018-07-12: 200 mg via INTRAVENOUS
  Administered 2018-07-12: 100 mg via INTRAVENOUS

## 2018-07-12 MED ORDER — BUPIVACAINE LIPOSOME 1.3 % IJ SUSP
INTRAMUSCULAR | Status: DC | PRN
  Administered 2018-07-12: 10 mL via PERINEURAL

## 2018-07-12 MED ORDER — SODIUM CHLORIDE 0.9 % IR SOLN
Status: DC | PRN
  Administered 2018-07-12: 3000 mL

## 2018-07-12 SURGICAL SUPPLY — 53 items
ANCHOR PEEK 4.75X19.1 SWLK C (Anchor) ×3 IMPLANT
BLADE SURG 11 STRL SS (BLADE) ×3 IMPLANT
BUR OVAL 4.0 (BURR) IMPLANT
CANISTER OMNI JUG 16 LITER (MISCELLANEOUS) IMPLANT
CANNULA 5.75X71 LONG (CANNULA) IMPLANT
CANNULA TWIST IN 8.25X7CM (CANNULA) ×3 IMPLANT
CHLORAPREP W/TINT 26ML (MISCELLANEOUS) ×3 IMPLANT
COVER WAND RF STERILE (DRAPES) IMPLANT
DRAPE IMP U-DRAPE 54X76 (DRAPES) ×3 IMPLANT
DRAPE INCISE IOBAN 66X45 STRL (DRAPES) ×3 IMPLANT
DRAPE ORTHO SPLIT 77X108 STRL (DRAPES) ×4
DRAPE STERI 35X30 U-POUCH (DRAPES) ×3 IMPLANT
DRAPE SURG 17X23 STRL (DRAPES) ×3 IMPLANT
DRAPE SURG ORHT 6 SPLT 77X108 (DRAPES) ×2 IMPLANT
DRAPE U-SHAPE 47X51 STRL (DRAPES) ×3 IMPLANT
DRSG PAD ABDOMINAL 8X10 ST (GAUZE/BANDAGES/DRESSINGS) ×6 IMPLANT
GAUZE SPONGE 4X4 12PLY STRL (GAUZE/BANDAGES/DRESSINGS) ×3 IMPLANT
GAUZE XEROFORM 1X8 LF (GAUZE/BANDAGES/DRESSINGS) ×3 IMPLANT
GLOVE BIO SURGEON STRL SZ7 (GLOVE) ×6 IMPLANT
GLOVE BIO SURGEON STRL SZ7.5 (GLOVE) ×3 IMPLANT
GLOVE BIOGEL PI IND STRL 7.0 (GLOVE) ×1 IMPLANT
GLOVE BIOGEL PI IND STRL 8 (GLOVE) ×1 IMPLANT
GLOVE BIOGEL PI INDICATOR 7.0 (GLOVE) ×2
GLOVE BIOGEL PI INDICATOR 8 (GLOVE) ×2
GOWN STRL REUS W/ TWL LRG LVL3 (GOWN DISPOSABLE) ×2 IMPLANT
GOWN STRL REUS W/TWL LRG LVL3 (GOWN DISPOSABLE) ×4
KIT BASIN OR (CUSTOM PROCEDURE TRAY) ×3 IMPLANT
KIT TURNOVER KIT B (KITS) ×3 IMPLANT
MANIFOLD NEPTUNE II (INSTRUMENTS) ×3 IMPLANT
NEEDLE SCORPION MULTI FIRE (NEEDLE) ×3 IMPLANT
NEEDLE SPNL 18GX3.5 QUINCKE PK (NEEDLE) ×3 IMPLANT
NS IRRIG 1000ML POUR BTL (IV SOLUTION) ×3 IMPLANT
PACK SHOULDER (CUSTOM PROCEDURE TRAY) ×3 IMPLANT
PACK UNIVERSAL I (CUSTOM PROCEDURE TRAY) ×3 IMPLANT
PAD ARMBOARD 7.5X6 YLW CONV (MISCELLANEOUS) ×6 IMPLANT
RESECTOR FULL RADIUS 4.2MM (BLADE) IMPLANT
SET ARTHROSCOPY TUBING (MISCELLANEOUS)
SET ARTHROSCOPY TUBING LN (MISCELLANEOUS) IMPLANT
SLING ARM FOAM STRAP LRG (SOFTGOODS) ×3 IMPLANT
SLING ARM FOAM STRAP MED (SOFTGOODS) IMPLANT
SPONGE LAP 4X18 RFD (DISPOSABLE) ×3 IMPLANT
SUPPORT WRAP ARM LG (MISCELLANEOUS) ×3 IMPLANT
SUT ETHILON 3 0 PS 1 (SUTURE) ×3 IMPLANT
SUTURE TAPE 1.3 FIBERLOP 20 ST (SUTURE) ×2 IMPLANT
SUTURE TAPE FIBERLINK 1.3 LOOP (SUTURE) IMPLANT
SUTURETAPE 1.3 FIBERLOOP 20 ST (SUTURE) ×6
SUTURETAPE FIBERLINK 1.3 LOOP (SUTURE)
TOWEL OR 17X24 6PK STRL BLUE (TOWEL DISPOSABLE) ×3 IMPLANT
TOWEL OR 17X26 10 PK STRL BLUE (TOWEL DISPOSABLE) ×3 IMPLANT
TUBE CONNECTING 12'X1/4 (SUCTIONS)
TUBE CONNECTING 12X1/4 (SUCTIONS) IMPLANT
WAND HAND CNTRL MULTIVAC 90 (MISCELLANEOUS) IMPLANT
WATER STERILE IRR 1000ML POUR (IV SOLUTION) ×3 IMPLANT

## 2018-07-12 NOTE — Anesthesia Preprocedure Evaluation (Addendum)
Anesthesia Evaluation  Patient identified by MRN, date of birth, ID band Patient awake    Reviewed: Allergy & Precautions, H&P , NPO status , Patient's Chart, lab work & pertinent test results  Airway Mallampati: III  TM Distance: >3 FB Neck ROM: Full    Dental no notable dental hx. (+) Teeth Intact, Dental Advisory Given   Pulmonary asthma , COPD, former smoker,    Pulmonary exam normal breath sounds clear to auscultation       Cardiovascular negative cardio ROS   Rhythm:Regular Rate:Normal     Neuro/Psych  Headaches, Anxiety Depression Bipolar Disorder Schizophrenia    GI/Hepatic Neg liver ROS, GERD  Medicated and Controlled,  Endo/Other  Hypothyroidism   Renal/GU negative Renal ROS  negative genitourinary   Musculoskeletal  (+) Arthritis , Osteoarthritis,    Abdominal   Peds  Hematology negative hematology ROS (+)   Anesthesia Other Findings   Reproductive/Obstetrics negative OB ROS                            Anesthesia Physical Anesthesia Plan  ASA: III  Anesthesia Plan: General   Post-op Pain Management:  Regional for Post-op pain   Induction: Intravenous  PONV Risk Score and Plan: 3 and Ondansetron, Dexamethasone and Midazolam  Airway Management Planned: Oral ETT  Additional Equipment:   Intra-op Plan:   Post-operative Plan: Extubation in OR  Informed Consent: I have reviewed the patients History and Physical, chart, labs and discussed the procedure including the risks, benefits and alternatives for the proposed anesthesia with the patient or authorized representative who has indicated his/her understanding and acceptance.   Dental advisory given  Plan Discussed with: CRNA  Anesthesia Plan Comments:         Anesthesia Quick Evaluation

## 2018-07-12 NOTE — Progress Notes (Signed)
Patient states that his neighbor brought him to hospital and will be taking him home.  Neighbor's name is Garald Balding and can be reached at [phone].  Patient states that he lives alone.  Nurse confirmed with Garald Balding that he was available and needed to stay with patient for the first 24 hours after the procedure.

## 2018-07-12 NOTE — Anesthesia Procedure Notes (Signed)
Anesthesia Regional Block: Interscalene brachial plexus block   Pre-Anesthetic Checklist: ,, timeout performed, Correct Patient, Correct Site, Correct Laterality, Correct Procedure, Correct Position, site marked, Risks and benefits discussed, pre-op evaluation,  At surgeon's request and post-op pain management  Laterality: Left  Prep: Maximum Sterile Barrier Precautions used, chloraprep       Needles:  Injection technique: Single-shot  Needle Type: Echogenic Stimulator Needle     Needle Length: 5cm  Needle Gauge: 22     Additional Needles:   Procedures:, nerve stimulator,,, ultrasound used (permanent image in chart),,,,   Nerve Stimulator or Paresthesia:  Response: Biceps response,   Additional Responses:   Narrative:  Start time: 07/12/2018 10:47 AM End time: 07/12/2018 10:57 AM Injection made incrementally with aspirations every 5 mL. Anesthesiologist: Roderic Palau, MD  Additional Notes: 2% Lidocaine skin wheel.

## 2018-07-12 NOTE — H&P (Signed)
Hayden Hall is an 62 y.o. male.   Chief Complaint: Left shoulder pain and dysfunction HPI: Long history of left shoulder pain and dysfunction, worsening.  Failed conservative management with rest anti-inflammatories and injections.  Recent MRI shows full-thickness rotator cuff tear.  Indicated for surgical management to try to decrease pain and restore function.  Past Medical History:  Diagnosis Date  . Anxiety   . Arthritis    RA   . Asthma   . Bipolar disorder ([place])   . BPH (benign prostatic hyperplasia)   . Bronchitis   . Cataract   . Chronic constipation   . Chronic pain   . COPD (chronic obstructive pulmonary disease) ([place])   . Depression   . Dry eye   . GERD (gastroesophageal reflux disease)   . Headache    migraines  . Hepatitis C    " had treatment " per pt  . Herpes   . History of pancreatitis   . History of seizure disorder   . Hypothyroidism   . Memory impairment   . Myoclonic jerking   . Neck injury   . Nocturia   . Pneumonia   . Rotator cuff tear    left  . Schizophrenia ([place])   . SOB (shortness of breath)   . Trigger finger   . Urinary frequency   . Wears glasses     Past Surgical History:  Procedure Laterality Date  . COLONOSCOPY    . ROTATOR CUFF REPAIR Left   . ROTATOR CUFF REPAIR Right   . TRIGGER FINGER RELEASE      Family History  Problem Relation Age of Onset  . Breast cancer Mother   . Kidney disease Mother   . Bladder Cancer Mother   . Heart disease Unknown   . Kidney disease Cousin   . Prostate cancer Father   . Stroke Father   . Kidney cancer Neg Hx    Social History:  reports that he has quit smoking. His smoking use included cigarettes. He has never used smokeless tobacco. He reports that he does not drink alcohol or use drugs.  Allergies:  Allergies  Allergen Reactions  . Vesicare [Solifenacin] Other (See Comments)    Dizziness     Medications Prior to Admission  Medication Sig Dispense Refill  . b complex  vitamins tablet Take 1 tablet by mouth 2 (two) times daily.    . Cholecalciferol (VITAMIN D PO) Take 1 capsule by mouth daily.    . cyclobenzaprine (FLEXERIL) 10 MG tablet Take 1 tablet (10 mg total) by mouth 3 (three) times daily as needed. (Patient taking differently: Take 10 mg by mouth 3 (three) times daily as needed for muscle spasms. ) 15 tablet 0  . Diphenhyd-Hydrocort-Nystatin (FIRST-DUKES MOUTHWASH MT) 1 Dose by Mouth Rinse route daily as needed (mouth sores).   6  . levothyroxine (SYNTHROID, LEVOTHROID) 25 MCG tablet Take 25 mcg by mouth daily.  4  . Milk Thistle 1000 MG CAPS Take 1,000 mg by mouth 3 (three) times daily.    . Misc Natural Products (TURMERIC CURCUMIN) CAPS Take 1 capsule by mouth 2 (two) times daily.    . Olopatadine HCl (PAZEO) 0.7 % SOLN Place 1 drop into both eyes daily as needed (dry eye).    . Omeprazole (RA OMEPRAZOLE) 20 MG TBEC Take 20 mg by mouth daily as needed (acid reflux).     Marland Kitchen oxyCODONE (OXY IR/ROXICODONE) 5 MG immediate release tablet 5 mg BID- TID prn moderate  to severe shoulder pain. Max 75/month (Patient taking differently: Take 5 mg by mouth 3 (three) times daily as needed for moderate pain. ) 75 tablet 0  . POTASSIUM PO Take 1 tablet by mouth daily.    Marland Kitchen terbinafine (LAMISIL) 250 MG tablet Take 1 tablet (250 mg total) by mouth daily. 90 tablet 0  . Melatonin 5 MG TABS Take 5 mg by mouth at bedtime as needed (sleep).    Marland Kitchen oxyCODONE-acetaminophen (PERCOCET) 7.5-325 MG tablet Take 1 tablet by mouth every 6 (six) hours as needed for severe pain. (Patient not taking: Reported on 07/05/2018) 12 tablet 0  . valACYclovir (VALTREX) 500 MG tablet Take 500 mg by mouth daily as needed (fever blisters).       Results for orders placed or performed during the hospital encounter of 07/12/18 (from the past 48 hour(s))  CBC     Status: Abnormal   Collection Time: 07/12/18  9:39 AM  Result Value Ref Range   WBC 3.7 (L) 4.0 - 10.5 K/uL   RBC 4.35 4.22 - 5.81 MIL/uL    Hemoglobin 13.6 13.0 - 17.0 g/dL   HCT 40.5 39.0 - 52.0 %   MCV 93.1 80.0 - 100.0 fL   MCH 31.3 26.0 - 34.0 pg   MCHC 33.6 30.0 - 36.0 g/dL   RDW 12.1 11.5 - 15.5 %   Platelets 229 150 - 400 K/uL   nRBC 0.0 0.0 - 0.2 %    Comment: Performed at [facility], [place] [address], [place], [place] [address]  Basic metabolic panel     Status: None   Collection Time: 07/12/18  9:39 AM  Result Value Ref Range   Sodium 143 135 - 145 mmol/L   Potassium 3.9 3.5 - 5.1 mmol/L   Chloride 108 98 - 111 mmol/L   CO2 25 22 - 32 mmol/L   Glucose, Bld 86 70 - 99 mg/dL   BUN 11 8 - 23 mg/dL   Creatinine, Ser 0.94 0.61 - 1.24 mg/dL   Calcium 9.5 8.9 - 10.3 mg/dL   GFR calc non Af Amer >60 >60 mL/min   GFR calc Af Amer >60 >60 mL/min    Comment: (NOTE) The eGFR has been calculated using the CKD EPI equation. This calculation has not been validated in all clinical situations. eGFR's persistently <60 mL/min signify possible Chronic Kidney Disease.    Anion gap 10 5 - 15    Comment: Performed at [facility] [address] [place], [place] [address]   No results found.  Review of Systems  All other systems reviewed and are negative.   Blood pressure (!) 125/91, pulse (!) 56, temperature 98 F (36.7 C), resp. rate 20, height _0  (1.803 m), weight 81.6 kg, SpO2 100 %. Physical Exam  Constitutional: He is oriented to person, place, and time. He appears well-developed and well-nourished.  HENT:  Head: Atraumatic.  Eyes: EOM are normal.  Cardiovascular: Intact distal pulses.  Respiratory: Effort normal.  Musculoskeletal:  Left shoulder pain and giveaway weakness with rotator cuff testing  Neurological: He is alert and oriented to person, place, and time.  Skin: Skin is warm and dry.  Psychiatric: He has a normal mood and affect.     Assessment/Plan Long history of left shoulder pain and dysfunction, worsening.  Failed conservative management with rest anti-inflammatories and  injections.  Recent MRI shows full-thickness rotator cuff tear.  Indicated for surgical management to try to decrease pain and restore  function. Plan left shoulder arthroscopy with rotator cuff repair versus debridement, subacromial decompression. Risks / benefits of surgery discussed Consent on chart  NPO for OR Preop antibiotics   Isabella Stalling, MD 07/12/2018, 10:57 AM

## 2018-07-12 NOTE — Discharge Instructions (Signed)

## 2018-07-12 NOTE — Anesthesia Procedure Notes (Signed)
Procedure Name: Intubation Date/Time: 07/12/2018 11:30 AM Performed by: Lavell Luster, CRNA Pre-anesthesia Checklist: Patient identified, Emergency Drugs available, Suction available, Patient being monitored and Timeout performed Patient Re-evaluated:Patient Re-evaluated prior to induction Oxygen Delivery Method: Circle system utilized Preoxygenation: Pre-oxygenation with 100% oxygen Induction Type: IV induction Ventilation: Mask ventilation without difficulty Laryngoscope Size: Mac and 4 Grade View: Grade II Tube type: Oral Tube size: 7.5 mm Number of attempts: 1 Airway Equipment and Method: Stylet Placement Confirmation: ETT inserted through vocal cords under direct vision,  positive ETCO2 and breath sounds checked- equal and bilateral Secured at: 22 cm Tube secured with: Tape Dental Injury: Teeth and Oropharynx as per pre-operative assessment

## 2018-07-12 NOTE — Anesthesia Postprocedure Evaluation (Signed)
Anesthesia Post Note  Patient: Hayden Hall  Procedure(s) Performed: LEFT SHOULDER ARTHROSCOPIC ROTATOR CUFF TEAR VS. DEBRIDEMENT, SUBACROMIAL DECOMPRESSION (Left Shoulder)     Patient location during evaluation: PACU Anesthesia Type: General and Regional Level of consciousness: awake and alert Pain management: pain level controlled Vital Signs Assessment: post-procedure vital signs reviewed and stable Respiratory status: spontaneous breathing, nonlabored ventilation and respiratory function stable Cardiovascular status: blood pressure returned to baseline and stable Postop Assessment: no apparent nausea or vomiting Anesthetic complications: no    Last Vitals:  Vitals:   07/12/18 1345 07/12/18 1350  BP:  (!) 138/98  Pulse: (!) 53 (!) 53  Resp: 13 (!) 22  Temp:    SpO2: 100% 99%    Last Pain:  Vitals:   07/12/18 1350  PainSc: 0-No pain                 Quay Simkin,W. EDMOND

## 2018-07-12 NOTE — Transfer of Care (Signed)
Immediate Anesthesia Transfer of Care Note  Patient: Hayden Hall  Procedure(s) Performed: LEFT SHOULDER ARTHROSCOPIC ROTATOR CUFF TEAR VS. DEBRIDEMENT, SUBACROMIAL DECOMPRESSION (Left Shoulder)  Patient Location: PACU  Anesthesia Type:General  Level of Consciousness: awake, alert  and oriented  Airway & Oxygen Therapy: Patient connected to face mask oxygen  Post-op Assessment: Post -op Vital signs reviewed and stable  Post vital signs: stable  Last Vitals:  Vitals Value Taken Time  BP 137/94 07/12/2018  1:05 PM  Temp    Pulse 80 07/12/2018  1:08 PM  Resp 17 07/12/2018  1:08 PM  SpO2 100 % 07/12/2018  1:08 PM  Vitals shown include unvalidated device data.  Last Pain:  Vitals:   07/12/18 0957  PainSc: 0-No pain      Patients Stated Pain Goal: 2 (38/25/05 3976)  Complications: No apparent anesthesia complications

## 2018-07-12 NOTE — Op Note (Signed)
Procedure(s): Procedure Note  Hayden Hall male 62 y.o. 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  Postoperative diagnosis: Same   Procedure(s) and Anesthesia Type: #1 left shoulder arthroscopic revision rotator cuff repair #2 left shoulder arthroscopic subacromial decompression     Surgeon(s) and Role:    Tania Ade, MD - Primary     Surgeon: Isabella Stalling   Assistants: Hayden Hubert PA-C [facility] was present and scrubbed throughout the procedure and was essential in positioning, assisting with the camera and instrumentation,, and closure)  Anesthesia: General endotracheal anesthesia with preoperative interscalene block given by the attending anesthesiologist    Procedure Detail  Estimated Blood Loss: Min         Drains: none  Blood Given: none         Specimens: none        Complications:  * No complications entered in OR log *         Disposition: PACU - hemodynamically stable.         Condition: stable    Procedure:   INDICATIONS FOR SURGERY: The patient is 62 y.o. male who had a history of left shoulder open rotator cuff repair about 8 years ago.  He had recurrent pain and repeat MRI showed recurrent large tear.  Indicated for surgical management to try to decrease pain and restore function.  OPERATIVE FINDINGS: Examination under anesthesia: No instability or stiffness.   DESCRIPTION OF PROCEDURE: The patient was identified in preoperative  holding area where I personally marked the operative site after  verifying site, side, and procedure with the patient. An interscalene block was given by the attending anesthesiologist the holding area.  The patient was taken back to the operating room where general anesthesia was induced without complication and was placed in the beach-chair position with the back  elevated about  60 degrees and all extremities and head and neck carefully padded and  positioned.   The left upper extremity was then prepped and  draped in a standard sterile fashion. The appropriate time-out  procedure was carried out. The patient did receive IV antibiotics  within 30 minutes of incision.   A small posterior portal incision was made and the arthroscope was introduced into the joint. An anterior portal was then established above the subscapularis using needle localization. Small cannula was placed anteriorly. Diagnostic arthroscopy was then carried out.  The subscapularis was noted to be intact.  Biceps tendon was intact.  Glenohumeral joint surfaces were intact without significant chondromalacia.  The infraspinatus was intact.  The supraspinatus was noted to have a recurrent tear which was sort of oblique in nature.  There was a suture strand tear with a knot that was removed.  The arthroscope was then introduced into the subacromial space a standard lateral portal was established with needle localization.   The rotator cuff tear was viewed from above.  It was an interesting oblique pattern.  Most of the tuberosity remained covered with posterior cuff, but it appeared that the most superficial layer of the cuff had delaminated and pulled anteriorly.  With a posterior lateral directed pull I was able to completely cover the open area superiorly with good tendon.  Therefore I felt that revision repair was indicated.  A large cannula was placed laterally.  The small cannula was placed anteriorly.  The camera was placed in a posterior lateral viewing portal.  The repair was  then carried out by placing 2 suture tapes in an inverted mattress configuration along the tear medial to lateral.  These 4 suture strands were then brought over to a 4.75 peek swivel lock anchor and a posterior lateral position reducing the tendon over its anatomic position and achieving a watertight closure superiorly.  The repair  was felt to be complete and not under any undue tension.  The coracoacromial ligament was taken down off the anterior acromion with the ArthroCare exposing a moderate residual hooked anterolateral acromial spur. A high-speed bur was then used through the lateral portal to take down the anterior acromial spur from lateral to medial in a standard acromioplasty.  The acromioplasty was also viewed from the lateral portal and the bur was used as necessary to ensure that the acromion was completely flat from posterior to anterior.  The arthroscopic equipment was removed from the joint and the portals were closed with 3-0 nylon in an interrupted fashion. Sterile dressings were then applied including Xeroform 4 x 4's ABDs and tape. The patient was then allowed to awaken from general anesthesia, placed in a sling, transferred to the stretcher and taken to the recovery room in stable condition.   POSTOPERATIVE PLAN: The patient will be discharged home today and will followup in one week for suture removal and wound check.  He will follow the standard cuff protocol.

## 2018-07-13 ENCOUNTER — Encounter (HOSPITAL_COMMUNITY): Payer: Self-pay | Admitting: Orthopedic Surgery

## 2018-07-13 ENCOUNTER — Telehealth: Payer: Self-pay | Admitting: *Deleted

## 2018-07-13 NOTE — Telephone Encounter (Signed)
Patient lvmail at 10:14  stating he needs to talk with nurse about Instructions after the procedure. He doesn't understand. Therapist says he is taking too much but he is following Dr. Elmon Else instructions. Please call patient

## 2018-07-13 NOTE — Telephone Encounter (Signed)
Talked with patient. Explained to him that it is OK for Dr Tamera Punt to prescribe medications for his surgery yesterday. Patient stated that he has a call out to Dr Tamera Punt for questions related to his surgery. Talked with pt about stool softners while taking Narcs and to buy OTC  Meds. States he is following Dr Elmon Else directions and Dr Tamera Punt.

## 2018-07-30 ENCOUNTER — Ambulatory Visit: Payer: Medicaid Other | Attending: Orthopedic Surgery | Admitting: Physical Therapy

## 2018-07-30 ENCOUNTER — Encounter: Payer: Self-pay | Admitting: Physical Therapy

## 2018-07-30 DIAGNOSIS — R531 Weakness: Secondary | ICD-10-CM

## 2018-07-30 DIAGNOSIS — M25512 Pain in left shoulder: Secondary | ICD-10-CM | POA: Insufficient documentation

## 2018-07-30 NOTE — Therapy (Signed)
[place], [place], [address] Phone: [phone]   Fax:  [phone]  Physical Therapy Evaluation  Patient Details  Name: Hayden Hall MRN: [id number] Date of Birth: September 17, 1956 Referring Provider (PT): Fermin Schwab   Encounter Date: 07/30/2018  PT End of Session - 07/30/18 1647    Visit Number  1    Number of Visits  12    Date for PT Re-Evaluation  10/22/18    Authorization Type  MCD    PT Start Time  0305    PT Stop Time  0345    PT Time Calculation (min)  40 min    Activity Tolerance  Patient tolerated treatment well    Behavior During Therapy  [facility] for tasks assessed/performed       Past Medical History:  Diagnosis Date  . Anxiety   . Arthritis    RA   . Asthma   . Bipolar disorder ([place])   . BPH (benign prostatic hyperplasia)   . Bronchitis   . Cataract   . Chronic constipation   . Chronic pain   . COPD (chronic obstructive pulmonary disease) ([place])   . Depression   . Dry eye   . GERD (gastroesophageal reflux disease)   . Headache    migraines  . Hepatitis C    " had treatment " per pt  . Herpes   . History of pancreatitis   . History of seizure disorder   . Hypothyroidism   . Memory impairment   . Myoclonic jerking   . Neck injury   . Nocturia   . Pneumonia   . Rotator cuff tear    left  . Schizophrenia ([place])   . SOB (shortness of breath)   . Trigger finger   . Urinary frequency   . Wears glasses     Past Surgical History:  Procedure Laterality Date  . COLONOSCOPY    . ROTATOR CUFF REPAIR Left   . ROTATOR CUFF REPAIR Right   . SHOULDER ARTHROSCOPY Left 07/12/2018   Procedure: LEFT SHOULDER ARTHROSCOPIC ROTATOR CUFF TEAR VS. DEBRIDEMENT, SUBACROMIAL DECOMPRESSION;  Surgeon: Tania Ade, MD;  Location: [place];  Service: Orthopedics;  Laterality: Left;  . TRIGGER FINGER RELEASE      There were no vitals filed for this visit.   Subjective Assessment -  07/30/18 1643    Subjective  Pt reports he had Lt RTC, and he knows he is supposed to stay in sl[place] until 5-6 weeks post op until he sees MD again. He relays he is currently staying with his sister at the [place] [facility] [place] so he can not attend Pt regularly here in [place].     Pertinent History  hx of neck and back pain, also has Rt RTC repair    Patient Stated Goals  know what to do for his shoulder    Currently in Pain?  Yes    Pain Score  5     Pain Location  Shoulder    Pain Orientation  Left    Pain Descriptors / Indicators  Aching;Stabbing    Pain Type  Surgical pain    Pain Radiating Towards  down Lt arm with N/T in last 2 fingers    Pain Onset  1 to 4 weeks ago    Pain Frequency  Intermittent         [facility] PT Assessment - 07/30/18 0001      Assessment   Medical Diagnosis  Lt RTC repair    Referring Provider (PT)  Fermin Schwab    Onset Date/Surgical Date  07/12/18    Next MD Visit  08/22/18      Precautions   Precaution Comments  , no AROM for 6 weeks      Balance Screen   Has the patient fallen in the past 6 months  No      Prior Function   Level of Independence  Independent    Vocation Requirements  --   not working     Observation/Other Assessments   Focus on Therapeutic Outcomes (FOTO)   MCD      Sensation   Light Touch  Appears Intact      ROM / Strength   AROM / PROM / Strength  PROM;Strength      PROM   PROM Assessment Site  Shoulder    Right/Left Shoulder  Left    Left Shoulder Flexion  90 Degrees    Left Shoulder ABduction  80 Degrees    Left Shoulder Internal Rotation  50 Degrees    Left Shoulder External Rotation  20 Degrees   stopped at 20 per protocol until day 28     Strength   Overall Strength Comments  Lt elbow/wrist at least 3/5, did not apply resistance or test Lt shoulder strenght due to post op status      Flexibility   Soft Tissue Assessment /Muscle Length  --   tight in Lt UT               Objective measurements  completed on examination: See above findings.      [place] [facility] PT Treatment/Exercise - 07/30/18 0001      Exercises   Exercises  Shoulder      Shoulder Exercises: Supine   Other Supine Exercises  supine AAROM begin week 5      Shoulder Exercises: Seated   Other Seated Exercises  elbow AROM and wrist ROM flex/ext and sup/pro X15 ea      Shoulder Exercises: Standing   Other Standing Exercises  pendulums X 20 CW and CCW      Shoulder Exercises: Pulleys   Flexion  --   begin week 5     Modalities   Modalities  Cryotherapy      Cryotherapy   Number Minutes Cryotherapy  10 Minutes    Cryotherapy Location  Shoulder    Type of Cryotherapy  Ice pack      Manual Therapy   Manual therapy comments  PROM to protocol 90 deg flexion and 20 ER until day 28             PT Education - 07/30/18 1647    Education provided  Yes    Education Details  HEP, POC    Person(s) Educated  Patient    Methods  Explanation;Demonstration;Verbal cues;Handout    Comprehension  Verbalized understanding;Need further instruction;Returned demonstration       PT Short Term Goals - 07/30/18 1715      PT SHORT TERM GOAL #1   Title  Pt will be I and compliant with initial HEP. 4 weeks 08/22/18    Baseline  no HEP until today    Status  New      PT SHORT TERM GOAL #2   Title  Pt will understand protocol and to ice shoulder for pain.     Baseline  protocol reviewed, he verbalized understanding today.    Status  New  PT Long Term Goals - 07/30/18 1716      PT LONG TERM GOAL #1   Title  Pt will be independent with HEP in order to decrease pain and resume full function at home. 12 weeks 10/22/17    Baseline  no HEP until today    Time  12    Period  Weeks    Status  New      PT LONG TERM GOAL #2   Title  Pt will demonstrate full R shoulder AROM/PROM when compared to L shoulder in order to be able to perform all self care and household responsibilities. 12 weeks 10/22/17    Baseline   no AROM allowed per protocol, PROM flex to 90, abd to 80, ER to 20    Time  12    Period  Weeks    Status  New      PT LONG TERM GOAL #3   Title  Pt will demonstrate full R shoulder strength when compared to L shoulder in order to be able to perform all self-care and household responsibilities. 12 weeks 10/22/17    Baseline  2/5 MMT    Time  12    Period  Weeks    Status  New      PT LONG TERM GOAL #4   Title  Pt will report worst pain no greater than 3/10 in order to demonstrate clinically significant reduction in pain to improve function at home. 12 weeks 10/22/17    Baseline  7/10    Time  12    Period  Weeks    Status  New             Plan - 07/30/18 1649    Clinical Impression Statement  Pt presents to PT S/P Lt RTC repair on 07/12/18. He has decreased ROM, decreased strength, decreased functional use of his Lt arm and increased pain limiting his funciton. He will benefit from skilled PT to address these deficits. He relays he is currently living with his sister at the [place] [facility] [place] so he may not be able to attend PT regularly here in [place]. He was encouraged to try to find PT place close to where he is staying for PROM and to reduce risk of frozen shoulder. He will see MD in [place] again in 4 weeks so he was scheduled to come at least one more PT visit on same day.     History and Personal Factors relevant to plan of care:  co morbidities, chronic pain    Clinical Presentation  Evolving    Clinical Decision Making  Moderate    Rehab Potential  Good    Clinical Impairments Affecting Rehab Potential  pt not living near PT facility    PT Frequency  1x / week    PT Duration  12 weeks    PT Treatment/Interventions  ADLs/Self Care Home Management;Neuromuscular re-education;Therapeutic exercise;Patient/family education;Cryotherapy;Electrical Stimulation;Moist Heat;Iontophoresis 4mg /ml Dexamethasone;Ultrasound;Manual techniques;Passive range of motion;Dry  needling;Taping;Vasopneumatic Device;Joint Manipulations    PT Next Visit Plan  pt will be 6 weeks post op when he returns to revise HEP for AROM and stretching    Consulted and Agree with Plan of Care  Patient       Patient will benefit from skilled therapeutic intervention in order to improve the following deficits and impairments:  Decreased activity tolerance, Decreased endurance, Decreased range of motion, Decreased strength, Hypomobility, Impaired flexibility, Increased fascial restricitons, Increased muscle spasms, Pain, Postural dysfunction  Visit Diagnosis:  Acute pain of left shoulder  General weakness     Problem List Patient Active Problem List   Diagnosis Date Noted  . 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 Osteoarthritis of spine with radiculopathy, cervical region 02/13/2018  . Cervicalgia 12/07/2017  . History of rotator cuff surgery 12/07/2017  . Cervical radiculopathy 12/07/2017  . DDD (degenerative disc disease), cervical 12/07/2017  . Chronic pain syndrome 12/07/2017  . BPH with obstruction/lower urinary tract symptoms 10/21/2015    Debbe Odea, PT, DPT 07/30/2018, 5:23 PM  [facility] [address] [place], [place], [address] Phone: [phone]   Fax:  [phone]  Name: Hayden Hall MRN: [id number] Date of Birth: 02-21-1956

## 2018-07-30 NOTE — Patient Instructions (Signed)
Access Code: [email or web address]  URL: [email or web address]  Date: 07/30/2018  Prepared by: Elsie Ra   Exercises  Circular Shoulder Pendulum with Table Support - 10 reps - 3 sets - 2x daily - 6x weekly  Seated Elbow Flexion Extension AROM - 10 reps - 3 sets - 2x daily - 6x weekly  Seated Supination and Pronation Coordination - 10 reps - 3 sets - 2x daily - 6x weekly  Seated Gripping Towel - 20 reps - 2 sets - 2x daily - 6x weekly  Seated Scapular Retraction - 10 reps - 2-3 sets - 5 sec hold - 2x daily - 6x weekly

## 2018-08-08 ENCOUNTER — Encounter (INDEPENDENT_AMBULATORY_CARE_PROVIDER_SITE_OTHER): Payer: Self-pay

## 2018-08-16 ENCOUNTER — Other Ambulatory Visit: Payer: Self-pay

## 2018-08-16 ENCOUNTER — Ambulatory Visit
Payer: Medicaid Other | Attending: Student in an Organized Health Care Education/Training Program | Admitting: Student in an Organized Health Care Education/Training Program

## 2018-08-16 ENCOUNTER — Encounter: Payer: Self-pay | Admitting: Student in an Organized Health Care Education/Training Program

## 2018-08-16 VITALS — BP 113/85 | HR 80 | Temp 98.2°F | Resp 18 | Ht 71.0 in | Wt 175.0 lb

## 2018-08-16 DIAGNOSIS — Z79899 Other long term (current) drug therapy: Secondary | ICD-10-CM | POA: Diagnosis not present

## 2018-08-16 DIAGNOSIS — Z5181 Encounter for therapeutic drug level monitoring: Secondary | ICD-10-CM | POA: Diagnosis not present

## 2018-08-16 DIAGNOSIS — M47812 Spondylosis without myelopathy or radiculopathy, cervical region: Secondary | ICD-10-CM

## 2018-08-16 DIAGNOSIS — E039 Hypothyroidism, unspecified: Secondary | ICD-10-CM | POA: Insufficient documentation

## 2018-08-16 DIAGNOSIS — Z79890 Hormone replacement therapy (postmenopausal): Secondary | ICD-10-CM | POA: Insufficient documentation

## 2018-08-16 DIAGNOSIS — M4722 Other spondylosis with radiculopathy, cervical region: Secondary | ICD-10-CM | POA: Diagnosis not present

## 2018-08-16 DIAGNOSIS — G894 Chronic pain syndrome: Secondary | ICD-10-CM | POA: Diagnosis not present

## 2018-08-16 DIAGNOSIS — M5030 Other cervical disc degeneration, unspecified cervical region: Secondary | ICD-10-CM | POA: Insufficient documentation

## 2018-08-16 DIAGNOSIS — M542 Cervicalgia: Secondary | ICD-10-CM | POA: Diagnosis not present

## 2018-08-16 DIAGNOSIS — N401 Enlarged prostate with lower urinary tract symptoms: Secondary | ICD-10-CM | POA: Insufficient documentation

## 2018-08-16 DIAGNOSIS — Z87891 Personal history of nicotine dependence: Secondary | ICD-10-CM | POA: Diagnosis not present

## 2018-08-16 DIAGNOSIS — Z9889 Other specified postprocedural states: Secondary | ICD-10-CM

## 2018-08-16 DIAGNOSIS — K219 Gastro-esophageal reflux disease without esophagitis: Secondary | ICD-10-CM | POA: Insufficient documentation

## 2018-08-16 DIAGNOSIS — M19011 Primary osteoarthritis, right shoulder: Secondary | ICD-10-CM

## 2018-08-16 DIAGNOSIS — J440 Chronic obstructive pulmonary disease with acute lower respiratory infection: Secondary | ICD-10-CM | POA: Insufficient documentation

## 2018-08-16 MED ORDER — OXYCODONE HCL 5 MG PO TABS: 5.0000 mg | ORAL_TABLET | Freq: Two times a day (BID) | ORAL | 0 refills | Status: DC | PRN

## 2018-08-16 MED ORDER — OXYCODONE HCL 5 MG PO TABS: 5.0000 mg | ORAL_TABLET | Freq: Every day | ORAL | 0 refills | Status: DC | PRN

## 2018-08-16 MED ORDER — OXYCODONE HCL 5 MG PO CAPS: 5.0000 mg | ORAL_CAPSULE | Freq: Two times a day (BID) | ORAL | 0 refills | Status: AC | PRN

## 2018-08-16 MED ORDER — CYCLOBENZAPRINE HCL 10 MG PO TABS: 10.0000 mg | ORAL_TABLET | Freq: Three times a day (TID) | ORAL | 2 refills | Status: AC | PRN

## 2018-08-16 NOTE — Patient Instructions (Signed)
You have been escribed oxycodone to last until 11/20/2018 and flexeril.

## 2018-08-16 NOTE — Progress Notes (Signed)
Patient's Name: Mont Jagoda  MRN: [id number]  Referring Provider: Theotis Burrow*  DOB: 05/19/1956  PCP: Theotis Burrow, MD  DOS: 08/16/2018  Note by: Gillis Santa, MD  Service setting: Ambulatory outpatient  Specialty: Interventional Pain Management  Location: [facility] (AMB) Pain Management Facility    Patient type: Established   Primary Reason(s) for Visit: Encounter for prescription drug management. (Level of risk: moderate)  CC: Neck Pain; Shoulder Pain (right); and Knee Pain (right)  HPI  Mr. Bobier is a 62 y.o. year old, male patient, who comes today for a medication management evaluation. He has BPH with obstruction/lower urinary tract symptoms; Cervicalgia; History of rotator cuff surgery; Cervical radiculopathy; DDD (degenerative disc disease), cervical; Chronic pain syndrome; Primary osteoarthritis of right shoulder; and Osteoarthritis of spine with radiculopathy, cervical region on their problem list. His primarily concern today is the Neck Pain; Shoulder Pain (right); and Knee Pain (right)  Pain Assessment: Location:   Neck Radiating: to right shoulder Onset: More than a month ago Duration: Chronic pain Quality: Aching Severity: 3 /10 (subjective, self-reported pain score)  Note: Reported level is compatible with observation.                         When using our objective Pain Scale, levels between 6 and 10/10 are said to belong in an emergency room, as it progressively worsens from a 6/10, described as severely limiting, requiring emergency care not usually available at an outpatient pain management facility. At a 6/10 level, communication becomes difficult and requires great effort. Assistance to reach the emergency department may be required. Facial flushing and profuse sweating along with potentially dangerous increases in heart rate and blood pressure will be evident. Effect on ADL:   Timing: Intermittent Modifying factors: meds BP: 113/85  HR:  80  Mr. Nicasio was last scheduled for an appointment on 07/05/2018 for medication management. During today's appointment we reviewed Mr. Hitchens chronic pain status, as well as his outpatient medication regimen.  Patient follows up today for medication management.  He is status post left shoulder arthroscopic surgery on 07/12/2018.  He states that he is recovering well.  He is participating in home exercises to help with his shoulder range of motion.  Today we will start weaning his oxycodone.  He states that he is taking less since his surgery.  The patient  reports that he does not use drugs. His body mass index is 24.41 kg/m.  Further details on both, my assessment(s), as well as the proposed treatment plan, please see below.  Controlled Substance Pharmacotherapy Assessment REMS (Risk Evaluation and Mitigation Strategy)  Analgesic: Oxycodone 5 mg twice daily to 3 times daily as needed, quantity 75/month MME/day: 15-20 mg/day.  [name], RN  08/16/2018  9:04 AM  Sign at close encounter Nursing Pain Medication Assessment:  Safety precautions to be maintained throughout the outpatient stay will include: orient to surroundings, keep bed in low position, maintain call bell within reach at all times, provide assistance with transfer out of bed and ambulation.  Medication Inspection Compliance: Pill count conducted under aseptic conditions, in front of the patient. Neither the pills nor the bottle was removed from the patient's sight at any time. Once count was completed pills were immediately returned to the patient in their original bottle.  Medication: Oxycodone IR Pill/Patch Count: 11 of 75 pills remain Pill/Patch Appearance: Markings consistent with prescribed medication Bottle Appearance: Standard pharmacy container. Clearly labeled. Filled Date: 89 /  9 / 2019 Last Medication intake:  Yesterday Pharmacokinetics: Liberation and absorption (onset of action): WNL Distribution (time  to peak effect): WNL Metabolism and excretion (duration of action): WNL         Pharmacodynamics: Desired effects: Analgesia: Mr. Hann reports >50% benefit. Functional ability: Patient reports that medication allows him to accomplish basic ADLs Clinically meaningful improvement in function (CMIF): Sustained CMIF goals met Perceived effectiveness: Described as relatively effective, allowing for increase in activities of daily living (ADL) Undesirable effects: Side-effects or Adverse reactions: None reported Monitoring: [place] PMP: Online review of the past 45-monthperiod conducted. Compliant with practice rules and regulations Last UDS on record: Summary  Date Value Ref Range Status  03/22/2018 FINAL  Final    Comment:    ==================================================================== TOXASSURE SELECT 13 (MW) ==================================================================== Specimen Alert Note:  Urinary creatinine is low; ability to detect some drugs may be compromised.  Interpret results with caution. ==================================================================== Test                             Result       Flag       Units   NO DRUGS DETECTED. ==================================================================== Test                      Result    Flag   Units      Ref Range   Creatinine              13        L      mg/dL      >=20 ==================================================================== Declared Medications:  Medication list was not provided. ==================================================================== For clinical consultation, please call ([phone] ====================================================================    UDS interpretation: Compliant          Medication Assessment Form: Reviewed. Patient indicates being compliant with therapy Treatment compliance: Compliant Risk Assessment Profile: Aberrant behavior: See prior  evaluations. None observed or detected today Comorbid factors increasing risk of overdose: See prior notes. No additional risks detected today Opioid risk tool (ORT) (Total Score): 22 Personal History of Substance Abuse (SUD-Substance use disorder):  Alcohol: Positive Male or Male  Illegal Drugs: Positive Male or Male  Rx Drugs: Positive Male or Male  ORT Risk Level calculation: High Risk Risk of substance use disorder (SUD): Low Opioid Risk Tool - 08/16/18 0901      Family History of Substance Abuse   Alcohol  Positive Male    Illegal Drugs  Positive Male    Rx Drugs  Positive Male or Male      Personal History of Substance Abuse   Alcohol  Positive Male or Male    Illegal Drugs  Positive Male or Male    Rx Drugs  Positive Male or Male      Age   Age between 194-45years   No      History of Preadolescent Sexual Abuse   History of Preadolescent Sexual Abuse  Negative or Male      Psychological Disease   Psychological Disease  Negative      Total Score   Opioid Risk Tool Scoring  22    Opioid Risk Interpretation  High Risk      ORT Scoring interpretation table:  Score <3 = Low Risk for SUD  Score between 4-7 = Moderate Risk for SUD  Score >8 = High Risk for Opioid Abuse   Risk Mitigation Strategies:  Patient  Counseling: Covered Patient-Prescriber Agreement (PPA): Present and active  Notification to other healthcare providers: Done  Pharmacologic Plan: No change in therapy, at this time.             Laboratory Chemistry  Inflammation Markers (CRP: Acute Phase) (ESR: Chronic Phase) No results found for: CRP, ESRSEDRATE, LATICACIDVEN                       Rheumatology Markers No results found for: RF, ANA, LABURIC, URICUR, LYMEIGGIGMAB, LYMEABIGMQN, HLAB27                      Renal Function Markers Lab Results  Component Value Date   BUN 11 07/12/2018   CREATININE 0.94 07/12/2018   GFRAA >60 07/12/2018   GFRNONAA >60 07/12/2018                              Hepatic Function Markers Lab Results  Component Value Date   AST 18 04/27/2018   ALT 13 04/27/2018   ALBUMIN 4.8 04/27/2018   ALKPHOS 46 04/27/2018   LIPASE 107 (H) 05/17/2017                        Electrolytes Lab Results  Component Value Date   NA 143 07/12/2018   K 3.9 07/12/2018   CL 108 07/12/2018   CALCIUM 9.5 07/12/2018                        Neuropathy Markers No results found for: VITAMINB12, FOLATE, HGBA1C, HIV                      CNS Tests No results found for: COLORCSF, APPEARCSF, RBCCOUNTCSF, WBCCSF, POLYSCSF, LYMPHSCSF, EOSCSF, PROTEINCSF, GLUCCSF, JCVIRUS, CSFOLI, IGGCSF                      Bone Pathology Markers No results found for: VD25OH, [id number], [id number], [id number], 25OHVITD1, 25OHVITD2, 25OHVITD3, TESTOFREE, TESTOSTERONE                       Coagulation Parameters Lab Results  Component Value Date   PLT 229 07/12/2018                        Cardiovascular Markers Lab Results  Component Value Date   TROPONINI <0.03 12/03/2016   HGB 13.6 07/12/2018   HCT 40.5 07/12/2018                         CA Markers No results found for: CEA, CA125, LABCA2                      Note: Lab results reviewed.  Recent Diagnostic Imaging Results  DG C-Arm 1-60 Min-No Report Fluoroscopy was utilized by the requesting physician.  No radiographic  interpretation.   Complexity Note: Imaging results reviewed. Results shared with Mr. Gotay, using Layman's terms.                         Meds   Current Outpatient Medications:  .  b complex vitamins tablet, Take 1 tablet by mouth 2 (two) times daily., Disp: , Rfl:  .  Cholecalciferol (VITAMIN D PO), Take 1 capsule by  mouth daily., Disp: , Rfl:  .  cyclobenzaprine (FLEXERIL) 10 MG tablet, Take 1 tablet (10 mg total) by mouth 3 (three) times daily as needed., Disp: 60 tablet, Rfl: 2 .  Diphenhyd-Hydrocort-Nystatin (FIRST-DUKES MOUTHWASH MT), 1 Dose by Mouth Rinse route daily as needed (mouth  sores). , Disp: , Rfl: 6 .  levothyroxine (SYNTHROID, LEVOTHROID) 25 MCG tablet, Take 25 mcg by mouth daily., Disp: , Rfl: 4 .  Milk Thistle 1000 MG CAPS, Take 1,000 mg by mouth 3 (three) times daily., Disp: , Rfl:  .  Misc Natural Products (TURMERIC CURCUMIN) CAPS, Take 1 capsule by mouth 2 (two) times daily., Disp: , Rfl:  .  Olopatadine HCl (PAZEO) 0.7 % SOLN, Place 1 drop into both eyes daily as needed (dry eye)., Disp: , Rfl:  .  Omeprazole (RA OMEPRAZOLE) 20 MG TBEC, Take 20 mg by mouth daily as needed (acid reflux). , Disp: , Rfl:  .  [START ON 08/22/2018] oxycodone (OXY-IR) 5 MG capsule, Take 1 capsule (5 mg total) by mouth 2 (two) times daily as needed., Disp: 60 capsule, Rfl: 0 .  POTASSIUM PO, Take 1 tablet by mouth daily., Disp: , Rfl:  .  valACYclovir (VALTREX) 500 MG tablet, Take 500 mg by mouth daily as needed (fever blisters). , Disp: , Rfl:  .  Melatonin 5 MG TABS, Take 5 mg by mouth at bedtime as needed (sleep)., Disp: , Rfl:  .  [START ON 09/21/2018] oxyCODONE (OXY IR/ROXICODONE) 5 MG immediate release tablet, Take 1 tablet (5 mg total) by mouth 2 (two) times daily as needed for severe pain. Max 45/month, Disp: 45 tablet, Rfl: 0 .  [START ON 10/21/2018] oxyCODONE (OXY IR/ROXICODONE) 5 MG immediate release tablet, Take 1 tablet (5 mg total) by mouth daily as needed for severe pain., Disp: 30 tablet, Rfl: 0  ROS  Constitutional: Denies any fever or chills Gastrointestinal: No reported hemesis, hematochezia, vomiting, or acute GI distress Musculoskeletal: Denies any acute onset joint swelling, redness, loss of ROM, or weakness Neurological: No reported episodes of acute onset apraxia, aphasia, dysarthria, agnosia, amnesia, paralysis, loss of coordination, or loss of consciousness  Allergies  Mr. Peasley is allergic to vesicare [solifenacin].  [place]  Drug: Mr. Benally  reports that he does not use drugs. Alcohol:  reports that he does not drink alcohol. Tobacco:  reports that he  has quit smoking. His smoking use included cigarettes. He has never used smokeless tobacco. Medical:  has a past medical history of Anxiety, Arthritis, Asthma, Bipolar disorder ([place]), BPH (benign prostatic hyperplasia), Bronchitis, Cataract, Chronic constipation, Chronic pain, COPD (chronic obstructive pulmonary disease) ([place]), Depression, Dry eye, GERD (gastroesophageal reflux disease), Headache, Hepatitis C, Herpes, History of pancreatitis, History of seizure disorder, Hypothyroidism, Memory impairment, Myoclonic jerking, Neck injury, Nocturia, Pneumonia, Rotator cuff tear, Schizophrenia ([place]), SOB (shortness of breath), Trigger finger, Urinary frequency, and Wears glasses. Surgical: Mr. Morren  has a past surgical history that includes Trigger finger release; Rotator cuff repair (Left); Rotator cuff repair (Right); Colonoscopy; and Shoulder arthroscopy (Left, 07/12/2018). Family: family history includes Bladder Cancer in his mother; Breast cancer in his mother; Heart disease in his unknown relative; Kidney disease in his cousin and mother; Prostate cancer in his father; Stroke in his father.  Constitutional Exam  General appearance: Well nourished, well developed, and well hydrated. In no apparent acute distress Vitals:   08/16/18 0852  BP: 113/85  Pulse: 80  Resp: 18  Temp: 98.2 F (36.8 C)  TempSrc: Oral  SpO2: 98%  Weight: 175 lb (79.4 kg)  Height: 5' 11" (1.803 m)   BMI Assessment: Estimated body mass index is 24.41 kg/m as calculated from the following:   Height as of this encounter: 5' 11" (1.803 m).   Weight as of this encounter: 175 lb (79.4 kg).  BMI interpretation table: BMI level Category Range association with higher incidence of chronic pain  <18 kg/m2 Underweight   18.5-24.9 kg/m2 Ideal body weight   25-29.9 kg/m2 Overweight Increased incidence by 20%  30-34.9 kg/m2 Obese (Class I) Increased incidence by 68%  35-39.9 kg/m2 Severe obesity (Class II) Increased incidence  by 136%  >40 kg/m2 Extreme obesity (Class III) Increased incidence by 254%   Patient's current BMI Ideal Body weight  Body mass index is 24.41 kg/m. Ideal body weight: 75.3 kg (166 lb 0.1 oz) Adjusted ideal body weight: 76.9 kg (169 lb 9.7 oz)   BMI Readings from Last 4 Encounters:  08/16/18 24.41 kg/m  07/12/18 25.10 kg/m  07/05/18 25.10 kg/m  05/29/18 24.07 kg/m   Wt Readings from Last 4 Encounters:  08/16/18 175 lb (79.4 kg)  07/12/18 180 lb (81.6 kg)  07/05/18 180 lb (81.6 kg)  05/29/18 172 lb 9.6 oz (78.3 kg)  Psych/Mental status: Alert, oriented x 3 (person, place, & time)       Eyes: PERLA Respiratory: No evidence of acute respiratory distress  Cervical Spine Area Exam  Skin & Axial Inspection: No masses, redness, edema, swelling, or associated skin lesions Alignment: Symmetrical Functional ROM: Unrestricted ROM      Stability: No instability detected Muscle Tone/Strength: Functionally intact. No obvious neuro-muscular anomalies detected. Sensory (Neurological): Unimpaired Palpation: No palpable anomalies              Upper Extremity (UE) Exam    Side: Right upper extremity  Side: Left upper extremity  Skin & Extremity Inspection: Skin color, temperature, and hair growth are WNL. No peripheral edema or cyanosis. No masses, redness, swelling, asymmetry, or associated skin lesions. No contractures.  Skin & Extremity Inspection: Left shoulder in sling  Functional ROM: Unrestricted ROM          Functional ROM: Mechanically restricted ROM          Muscle Tone/Strength: Functionally intact. No obvious neuro-muscular anomalies detected.  Muscle Tone/Strength: Functionally intact. No obvious neuro-muscular anomalies detected.  Sensory (Neurological): Unimpaired          Sensory (Neurological): Arthropathic arthralgia          Palpation: No palpable anomalies              Palpation: Complains of area being tender to palpation              Provocative Test(s):  Phalen's test:  deferred Tinel's test: deferred Apley's scratch test (touch opposite shoulder):  Action 1 (Across chest): deferred Action 2 (Overhead): deferred Action 3 (LB reach): deferred   Provocative Test(s):  Phalen's test: deferred Tinel's test: deferred Apley's scratch test (touch opposite shoulder):  Action 1 (Across chest): deferred Action 2 (Overhead): deferred Action 3 (LB reach): deferred    Thoracic Spine Area Exam  Skin & Axial Inspection: No masses, redness, or swelling Alignment: Symmetrical Functional ROM: Unrestricted ROM Stability: No instability detected Muscle Tone/Strength: Functionally intact. No obvious neuro-muscular anomalies detected. Sensory (Neurological): Unimpaired Muscle strength & Tone: No palpable anomalies  Lumbar Spine Area Exam  Skin & Axial Inspection: No masses, redness, or swelling Alignment: Symmetrical Functional ROM: Unrestricted ROM       Stability:  No instability detected Muscle Tone/Strength: Functionally intact. No obvious neuro-muscular anomalies detected. Sensory (Neurological): Unimpaired Palpation: No palpable anomalies       Provocative Tests: Hyperextension/rotation test: deferred today       Lumbar quadrant test (Kemp's test): deferred today       Lateral bending test: deferred today       Patrick's Maneuver: deferred today                   FABER test: deferred today                   S-I anterior distraction/compression test: deferred today         S-I lateral compression test: deferred today         S-I Thigh-thrust test: deferred today         S-I Gaenslen's test: deferred today          Gait & Posture Assessment  Ambulation: Unassisted Gait: Relatively normal for age and body habitus Posture: WNL   Lower Extremity Exam    Side: Right lower extremity  Side: Left lower extremity  Stability: No instability observed          Stability: No instability observed          Skin & Extremity Inspection: Skin color, temperature, and  hair growth are WNL. No peripheral edema or cyanosis. No masses, redness, swelling, asymmetry, or associated skin lesions. No contractures.  Skin & Extremity Inspection: Skin color, temperature, and hair growth are WNL. No peripheral edema or cyanosis. No masses, redness, swelling, asymmetry, or associated skin lesions. No contractures.  Functional ROM: Unrestricted ROM                  Functional ROM: Unrestricted ROM                  Muscle Tone/Strength: Functionally intact. No obvious neuro-muscular anomalies detected.  Muscle Tone/Strength: Functionally intact. No obvious neuro-muscular anomalies detected.  Sensory (Neurological): Unimpaired medial portion of foot (L4)  Sensory (Neurological): Unimpaired medial portion of foot (L4)  DTR: Patellar: deferred today Achilles: deferred today Plantar: deferred today  DTR: Patellar: deferred today Achilles: deferred today Plantar: deferred today  Palpation: No palpable anomalies  Palpation: No palpable anomalies   Assessment  Primary Diagnosis & Pertinent Problem List: The primary encounter diagnosis was History of rotator cuff surgery. Diagnoses of Primary osteoarthritis of right shoulder, Chronic pain syndrome, Cervicalgia, and Cervical facet joint syndrome - R C3,4,5,6,7 were also pertinent to this visit.  Status Diagnosis  Controlled Controlled Controlled 1. History of rotator cuff surgery   2. Primary osteoarthritis of right shoulder   3. Chronic pain syndrome   4. Cervicalgia   5. Cervical facet joint syndrome - R C3,4,5,6,7     General Recommendations: The pain condition that the patient suffers from is best treated with a multidisciplinary approach that involves an increase in physical activity to prevent de-conditioning and worsening of the pain cycle, as well as psychological counseling (formal and/or informal) to address the co-morbid psychological affects of pain. Treatment will often involve judicious use of pain medications  and interventional procedures to decrease the pain, allowing the patient to participate in the physical activity that will ultimately produce long-lasting pain reductions. The goal of the multidisciplinary approach is to return the patient to a higher level of overall function and to restore their ability to perform activities of daily living.  62 year old male with history of chronic pain  secondary to bilateral shoulder osteoarthritis, rotator cuff injury who is status post left shoulder arthroscopy on 07/12/2018.  Patient is recovering well from the surgery from a pain standpoint.  He is participating in rehab.  We will plan on weaning his oxycodone on a monthly basis as below.  We will refill Flexeril as well.   Plan of Care  Pharmacotherapy (Medications Ordered): Meds ordered this encounter  Medications  . oxycodone (OXY-IR) 5 MG capsule    Sig: Take 1 capsule (5 mg total) by mouth 2 (two) times daily as needed.    Dispense:  60 capsule    Refill:  0  . oxyCODONE (OXY IR/ROXICODONE) 5 MG immediate release tablet    Sig: Take 1 tablet (5 mg total) by mouth 2 (two) times daily as needed for severe pain. Max 45/month    Dispense:  45 tablet    Refill:  0    Do not place this medication, or any other prescription from our practice, on "Automatic Refill". Patient may have prescription filled one day early if pharmacy is closed on scheduled refill date.  Marland Kitchen oxyCODONE (OXY IR/ROXICODONE) 5 MG immediate release tablet    Sig: Take 1 tablet (5 mg total) by mouth daily as needed for severe pain.    Dispense:  30 tablet    Refill:  0    Do not place this medication, or any other prescription from our practice, on "Automatic Refill". Patient may have prescription filled one day early if pharmacy is closed on scheduled refill date.  . cyclobenzaprine (FLEXERIL) 10 MG tablet    Sig: Take 1 tablet (10 mg total) by mouth 3 (three) times daily as needed.    Dispense:  60 tablet    Refill:  2    Provider-requested follow-up: Return in about 3 months (around 11/16/2018) for Medication Management.  Future Appointments  Date Time Provider [place]  08/22/2018 11:00 AM Laureen Ochs [facility]  11/08/2018  8:45 AM Gillis Santa, MD [facility] None    Primary Care Physician: Theotis Burrow, MD Location: [facility] Note by: Gillis Santa, M.D Date: 08/16/2018; Time: 9:52 AM  Patient Instructions  You have been escribed oxycodone to last until 11/20/2018 and flexeril.

## 2018-08-16 NOTE — Progress Notes (Signed)
Nursing Pain Medication Assessment:  Safety precautions to be maintained throughout the outpatient stay will include: orient to surroundings, keep bed in low position, maintain call bell within reach at all times, provide assistance with transfer out of bed and ambulation.  Medication Inspection Compliance: Pill count conducted under aseptic conditions, in front of the patient. Neither the pills nor the bottle was removed from the patient's sight at any time. Once count was completed pills were immediately returned to the patient in their original bottle.  Medication: Oxycodone IR Pill/Patch Count: 11 of 75 pills remain Pill/Patch Appearance: Markings consistent with prescribed medication Bottle Appearance: Standard pharmacy container. Clearly labeled. Filled Date: 10 / 9 / 2019 Last Medication intake:  Yesterday

## 2018-08-22 ENCOUNTER — Ambulatory Visit: Payer: Medicaid Other | Attending: Orthopedic Surgery

## 2018-08-22 DIAGNOSIS — R531 Weakness: Secondary | ICD-10-CM | POA: Insufficient documentation

## 2018-08-22 DIAGNOSIS — M25512 Pain in left shoulder: Secondary | ICD-10-CM | POA: Insufficient documentation

## 2018-08-22 NOTE — Patient Instructions (Signed)
Supine Shoulder Press with Dowel   reps: 10 sets: 1   daily: 1-2 weekly: 7  Supine Shoulder External Rotation with Dowel   reps: 10 sets: 1   daily: 1-2 weekly: 7  Supine Shoulder Flexion Extension AAROM with Dowel   reps: 10 sets: 1   daily: 1-2 weekly: 7  Prone Shoulder Row   reps: 10 sets: 1   daily: 1-2 weekly: 7  Sidelying Shoulder External Rotation AROM   reps: 10 sets: 1   daily: 1-2 weekly: 7

## 2018-08-22 NOTE — Therapy (Signed)
[facility] [place], [place], [address] Phone: [phone]   Fax:  [phone]  Physical Therapy Treatment  Patient Details  Name: Demareon Coldwell MRN: [id number] Date of Birth: [birth year] Referring Provider (PT): Fermin Schwab   Encounter Date: 08/22/2018  PT End of Session - 08/22/18 1208    Visit Number  2    Number of Visits  12   of 3 approved after eval   Date for PT Re-Evaluation  10/22/18    Authorization Type  MCD    PT Start Time  1105    PT Stop Time  1200    PT Time Calculation (min)  55 min    Activity Tolerance  Patient tolerated treatment well    Behavior During Therapy  [facility] for tasks assessed/performed       Past Medical History:  Diagnosis Date  . Anxiety   . Arthritis    RA   . Asthma   . Bipolar disorder ([place])   . BPH (benign prostatic hyperplasia)   . Bronchitis   . Cataract   . Chronic constipation   . Chronic pain   . COPD (chronic obstructive pulmonary disease) ([place])   . Depression   . Dry eye   . GERD (gastroesophageal reflux disease)   . Headache    migraines  . Hepatitis C    " had treatment " per pt  . Herpes   . History of pancreatitis   . History of seizure disorder   . Hypothyroidism   . Memory impairment   . Myoclonic jerking   . Neck injury   . Nocturia   . Pneumonia   . Rotator cuff tear    left  . Schizophrenia ([place])   . SOB (shortness of breath)   . Trigger finger   . Urinary frequency   . Wears glasses     Past Surgical History:  Procedure Laterality Date  . COLONOSCOPY    . ROTATOR CUFF REPAIR Left   . ROTATOR CUFF REPAIR Right   . SHOULDER ARTHROSCOPY Left 07/12/2018   Procedure: LEFT SHOULDER ARTHROSCOPIC ROTATOR CUFF TEAR VS. DEBRIDEMENT, SUBACROMIAL DECOMPRESSION;  Surgeon: Tania Ade, MD;  Location: [facility];  Service: Orthopedics;  Laterality: Left;  . TRIGGER FINGER RELEASE      There were no vitals filed for this  visit.  Subjective Assessment - 08/22/18 1109    Subjective  Saw doctor and he said I can take the sling off.  Been doing pendulums, hand over and back and bending elbow, playing guitar and sliding hand down the neck. Lateral two fingers numb and feel like in a fire which occurred during surgery.      Currently in Pain?  Yes    Pain Score  3     Pain Location  Shoulder    Pain Orientation  Left    Pain Descriptors / Indicators  Sharp    Pain Onset  More than a month ago    Pain Frequency  Intermittent    Aggravating Factors   moving it the wrong way    Pain Relieving Factors  medicine         [facility] PT Assessment - 08/22/18 0001      PROM   PROM Assessment Site  Shoulder    Right/Left Shoulder  Left    Left Shoulder Flexion  95 Degrees    Left Shoulder ABduction  90 Degrees    Left Shoulder Internal Rotation  70 Degrees   at 30 degrees abduction   Left Shoulder External Rotation  40 Degrees                   [facility] Adult PT Treatment/Exercise - 08/22/18 0001      Exercises   Exercises  Shoulder      Shoulder Exercises: Supine   External Rotation  AAROM;Left;10 reps   with cane   Internal Rotation  AAROM;Left;10 reps   with cane   Flexion  AAROM;12 reps   with cane     Shoulder Exercises: Seated   Retraction  Strengthening;10 reps   5 sec hold, cues for limiting elevation     Shoulder Exercises: Prone   Retraction  Left;Strengthening;12 reps   prone row to neutral     Shoulder Exercises: Sidelying   External Rotation  Strengthening;Left;10 reps   to neutral only antigravity     Shoulder Exercises: Pulleys   Flexion  2 minutes   cues for limit to painfree AAROM   Scaption  1 minute    Scaption Limitations  assist for keeping arm out and for limiting ROM to painfree      Modalities   Modalities  Vasopneumatic      Cryotherapy   Number Minutes Cryotherapy  15 Minutes    Cryotherapy Location  Shoulder   Left     Vasopneumatic   Number Minutes  Vasopneumatic   15 minutes    Vasopnuematic Location   Shoulder   Left   Vasopneumatic Pressure  Low      Manual Therapy   Manual Therapy  Passive ROM    Manual therapy comments  PROM for motion and Gr.1-2 mobs for pain             PT Education - 08/22/18 1207    Education provided  Yes    Education Details  HEP, progression of protocol, continued ice    Person(s) Educated  Patient    Methods  Explanation;Demonstration;Handout    Comprehension  Verbalized understanding;Need further instruction;Returned demonstration       PT Short Term Goals - 07/30/18 1715      PT SHORT TERM GOAL #1   Title  Pt will be I and compliant with initial HEP. 4 weeks 08/22/18    Baseline  no HEP until today    Status  New      PT SHORT TERM GOAL #2   Title  Pt will understand protocol and to ice shoulder for pain.     Baseline  protocol reviewed, he verbalized understanding today.    Status  New        PT Long Term Goals - 07/30/18 1716      PT LONG TERM GOAL #1   Title  Pt will be independent with HEP in order to decrease pain and resume full function at home. 12 weeks 10/22/17    Baseline  no HEP until today    Time  12    Period  Weeks    Status  New      PT LONG TERM GOAL #2   Title  Pt will demonstrate full R shoulder AROM/PROM when compared to L shoulder in order to be able to perform all self care and household responsibilities. 12 weeks 10/22/17    Baseline  no AROM allowed per protocol, PROM flex to 90, abd to 80, ER to 20    Time  12    Period  Weeks  Status  New      PT LONG TERM GOAL #3   Title  Pt will demonstrate full R shoulder strength when compared to L shoulder in order to be able to perform all self-care and household responsibilities. 12 weeks 10/22/17    Baseline  2/5 MMT    Time  12    Period  Weeks    Status  New      PT LONG TERM GOAL #4   Title  Pt will report worst pain no greater than 3/10 in order to demonstrate clinically significant reduction in  pain to improve function at home. 12 weeks 10/22/17    Baseline  7/10    Time  12    Period  Weeks    Status  New            Plan - 08/22/18 1208    Clinical Impression Statement  Patient just s/p MD visit and reports he discontinued the sling.  Feel he has at least maintained ROM with initial HEP.  Progressed today with supine cane and pulleys with careful attention to posture and stopping at point of pain, not pushing into pain.  Now living locally and can attend so see plan below for continued skilled PT to progress to goals.    PT Frequency  1x / week    PT Duration  12 weeks    PT Treatment/Interventions  ADLs/Self Care Home Management;Neuromuscular re-education;Therapeutic exercise;Patient/family education;Cryotherapy;Electrical Stimulation;Moist Heat;Iontophoresis 4mg /ml Dexamethasone;Ultrasound;Manual techniques;Passive range of motion;Dry needling;Taping;Vasopneumatic Device;Joint Manipulations    PT Next Visit Plan  progress HEP for AROM as tolerated, table top flexion vs wall flexion, cont scapular strengthening    PT Home Exercise Plan  scap retract, supine cane flex, press up, ER, prone row and side ER to neutral    Consulted and Agree with Plan of Care  Patient       Patient will benefit from skilled therapeutic intervention in order to improve the following deficits and impairments:     Visit Diagnosis: Acute pain of left shoulder  General weakness     Problem List Patient Active Problem List   Diagnosis Date Noted  . 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 Osteoarthritis of spine with radiculopathy, cervical region 02/13/2018  . Cervicalgia 12/07/2017  . History of rotator cuff surgery 12/07/2017  . Cervical radiculopathy 12/07/2017  . DDD (degenerative disc disease), cervical 12/07/2017  . Chronic pain syndrome 12/07/2017  . BPH with obstruction/lower urinary tract symptoms 10/21/2015    Reginia Naas, PT 08/22/2018, 12:17 PM  [facility] [address] [place], [place], [address] Phone: [phone]   Fax:  [phone]  Name: Adlai Sinning MRN: [id number] Date of Birth: March 29, 1956

## 2018-08-24 ENCOUNTER — Emergency Department
Admission: EM | Admit: 2018-08-24 | Discharge: 2018-08-24 | Disposition: A | Discharge status: Home / Self Care | Payer: Medicaid Other | Attending: Emergency Medicine | Admitting: Emergency Medicine

## 2018-08-24 ENCOUNTER — Encounter: Payer: Self-pay | Admitting: Emergency Medicine

## 2018-08-24 DIAGNOSIS — F419 Anxiety disorder, unspecified: Secondary | ICD-10-CM | POA: Insufficient documentation

## 2018-08-24 DIAGNOSIS — Z79899 Other long term (current) drug therapy: Secondary | ICD-10-CM | POA: Insufficient documentation

## 2018-08-24 DIAGNOSIS — E039 Hypothyroidism, unspecified: Secondary | ICD-10-CM | POA: Diagnosis not present

## 2018-08-24 DIAGNOSIS — F319 Bipolar disorder, unspecified: Secondary | ICD-10-CM | POA: Diagnosis not present

## 2018-08-24 DIAGNOSIS — J449 Chronic obstructive pulmonary disease, unspecified: Secondary | ICD-10-CM | POA: Diagnosis not present

## 2018-08-24 DIAGNOSIS — Z87891 Personal history of nicotine dependence: Secondary | ICD-10-CM | POA: Insufficient documentation

## 2018-08-24 DIAGNOSIS — F209 Schizophrenia, unspecified: Secondary | ICD-10-CM | POA: Diagnosis not present

## 2018-08-24 DIAGNOSIS — K6289 Other specified diseases of anus and rectum: Secondary | ICD-10-CM | POA: Diagnosis present

## 2018-08-24 DIAGNOSIS — J45909 Unspecified asthma, uncomplicated: Secondary | ICD-10-CM | POA: Insufficient documentation

## 2018-08-24 DIAGNOSIS — K649 Unspecified hemorrhoids: Secondary | ICD-10-CM | POA: Diagnosis not present

## 2018-08-24 MED ORDER — HYDROCORTISONE 2.5 % RE CREA: 1.0000 "application " | TOPICAL_CREAM | Freq: Two times a day (BID) | RECTAL | 0 refills | Status: DC

## 2018-08-24 NOTE — Discharge Instructions (Signed)
Please seek medical attention for any high fevers, chest pain, shortness of breath, change in behavior, persistent vomiting, bloody stool or any other new or concerning symptoms.  

## 2018-08-24 NOTE — ED Provider Notes (Signed)
[facility] Emergency Department Provider Note   ____________________________________________   I have reviewed the triage vital signs and the nursing notes.   HISTORY  Chief Complaint Rectal Problems   History limited by: Not Limited   HPI Hayden Hall is a 62 y.o. male who presents to the emergency department today because of concerns for possible hemorrhoid versus cancer.  He states that for the past week he has noticed some tissue emerging through his anus.  He states he has had hemorrhoids in the past and has had banding done in the past.  He in fact has an appointment scheduled next week with his GI doctor for this however he decided to come in today to make sure nothing more serious was going on.  This has been painful.  Patient denies any bleeding.  Denies any recent fevers.   Per medical record review patient has a history of asthma, chronic constipation  Past Medical History:  Diagnosis Date  . Anxiety   . Arthritis    RA   . Asthma   . Bipolar disorder ([place])   . BPH (benign prostatic hyperplasia)   . Bronchitis   . Cataract   . Chronic constipation   . Chronic pain   . COPD (chronic obstructive pulmonary disease) ([place])   . Depression   . Dry eye   . GERD (gastroesophageal reflux disease)   . Headache    migraines  . Hepatitis C    " had treatment " per pt  . Herpes   . History of pancreatitis   . History of seizure disorder   . Hypothyroidism   . Memory impairment   . Myoclonic jerking   . Neck injury   . Nocturia   . Pneumonia   . Rotator cuff tear    left  . Schizophrenia ([place])   . SOB (shortness of breath)   . Trigger finger   . Urinary frequency   . Wears glasses     Patient Active Problem List   Diagnosis Date Noted  . 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 Osteoarthritis of spine with radiculopathy, cervical region 02/13/2018  . Cervicalgia 12/07/2017  . History of rotator cuff surgery  12/07/2017  . Cervical radiculopathy 12/07/2017  . DDD (degenerative disc disease), cervical 12/07/2017  . Chronic pain syndrome 12/07/2017  . BPH with obstruction/lower urinary tract symptoms 10/21/2015    Past Surgical History:  Procedure Laterality Date  . COLONOSCOPY    . ROTATOR CUFF REPAIR Left   . ROTATOR CUFF REPAIR Right   . SHOULDER ARTHROSCOPY Left 07/12/2018   Procedure: LEFT SHOULDER ARTHROSCOPIC ROTATOR CUFF TEAR VS. DEBRIDEMENT, SUBACROMIAL DECOMPRESSION;  Surgeon: Tania Ade, MD;  Location: [facility];  Service: Orthopedics;  Laterality: Left;  . TRIGGER FINGER RELEASE      Prior to Admission medications   Medication Sig Start Date End Date Taking? Authorizing Provider  b complex vitamins tablet Take 1 tablet by mouth 2 (two) times daily.    [provider]  Cholecalciferol (VITAMIN D PO) Take 1 capsule by mouth daily.    [provider]  cyclobenzaprine (FLEXERIL) 10 MG tablet Take 1 tablet (10 mg total) by mouth 3 (three) times daily as needed. 08/16/18 10/15/18  Gillis Santa, MD  Diphenhyd-Hydrocort-Nystatin (FIRST-DUKES MOUTHWASH MT) 1 Dose by Mouth Rinse route daily as needed (mouth sores).  09/28/15   [provider]  levothyroxine (SYNTHROID, LEVOTHROID) 25 MCG tablet Take 25 mcg by mouth daily. 09/16/15  [provider]  Melatonin 5 MG TABS Take 5 mg by mouth at bedtime as needed (sleep).    [provider]  Milk Thistle 1000 MG CAPS Take 1,000 mg by mouth 3 (three) times daily.    [provider]  Misc Natural Products (TURMERIC CURCUMIN) CAPS Take 1 capsule by mouth 2 (two) times daily.    [provider]  Olopatadine HCl (PAZEO) 0.7 % SOLN Place 1 drop into both eyes daily as needed (dry eye).    [provider]  Omeprazole (RA OMEPRAZOLE) 20 MG TBEC Take 20 mg by mouth daily as needed (acid reflux).  12/24/08   [provider]  oxyCODONE (OXY IR/ROXICODONE) 5 MG immediate release  tablet Take 1 tablet (5 mg total) by mouth 2 (two) times daily as needed for severe pain. Max 45/month 09/21/18 10/21/18  Gillis Santa, MD  oxyCODONE (OXY IR/ROXICODONE) 5 MG immediate release tablet Take 1 tablet (5 mg total) by mouth daily as needed for severe pain. 10/21/18 11/20/18  Gillis Santa, MD  oxycodone (OXY-IR) 5 MG capsule Take 1 capsule (5 mg total) by mouth 2 (two) times daily as needed. 08/22/18 09/21/18  Gillis Santa, MD  POTASSIUM PO Take 1 tablet by mouth daily.    [provider]  valACYclovir (VALTREX) 500 MG tablet Take 500 mg by mouth daily as needed (fever blisters).  01/17/11   [provider]    Allergies Vesicare [solifenacin]  Family History  Problem Relation Age of Onset  . Breast cancer Mother   . Kidney disease Mother   . Bladder Cancer Mother   . Heart disease Unknown   . Kidney disease Cousin   . Prostate cancer Father   . Stroke Father   . Kidney cancer Neg Hx     Social History Social History   Tobacco Use  . Smoking status: Former Smoker    Types: Cigarettes  . Smokeless tobacco: Never Used  . Tobacco comment: quit 20 years  Substance Use Topics  . Alcohol use: No    Alcohol/week: 0.0 standard drinks  . Drug use: No    Review of Systems Constitutional: No fever/chills Eyes: No visual changes. ENT: No sore throat. Cardiovascular: Denies chest pain. Respiratory: Denies shortness of breath. Gastrointestinal: Positive for rectal mass Genitourinary: Negative for dysuria. Musculoskeletal: Negative for back pain. Skin: Negative for rash. Neurological: Negative for headaches, focal weakness or numbness.  ____________________________________________   PHYSICAL EXAM:  VITAL SIGNS: ED Triage Vitals  Enc Vitals Group     BP 08/24/18 1705 (!) 135/117     Pulse Rate 08/24/18 1705 88     Resp 08/24/18 1705 18     Temp 08/24/18 1705 98.5 F (36.9 C)     Temp Source 08/24/18 1705 Oral     SpO2 08/24/18 1705 96 %      Weight 08/24/18 1706 165 lb (74.8 kg)     Height 08/24/18 1706 5\' 11"  (1.803 m)     Head Circumference --      Peak Flow --      Pain Score 08/24/18 1705 3   Constitutional: Alert and oriented.  Eyes: Conjunctivae are normal.  ENT      Head: Normocephalic and atraumatic.      Nose: No congestion/rhinnorhea.      Mouth/Throat: Mucous membranes are moist.      Neck: No stridor. Hematological/Lymphatic/Immunilogical: No cervical lymphadenopathy. Cardiovascular: Normal rate, regular rhythm.  No murmurs, rubs, or gallops.  Respiratory: Normal respiratory  effort without tachypnea nor retractions. Breath sounds are clear and equal bilaterally. No wheezes/rales/rhonchi. Gastrointestinal: Soft and non tender. No rebound. No guarding.  Rectal: Small external hemorrhoid noted Musculoskeletal: Normal range of motion in all extremities. No lower extremity edema. Neurologic:  Normal speech and language. No gross focal neurologic deficits are appreciated.  Skin:  Skin is warm, dry and intact. No rash noted. Psychiatric: Mood and affect are normal. Speech and behavior are normal. Patient exhibits appropriate insight and judgment.  ____________________________________________    LABS (pertinent positives/negatives)  None  ____________________________________________   EKG  None  ____________________________________________    RADIOLOGY  None   ____________________________________________   PROCEDURES  Procedures  ____________________________________________   INITIAL IMPRESSION / ASSESSMENT AND PLAN / ED COURSE  Pertinent labs & imaging results that were available during my care of the patient were reviewed by me and considered in my medical decision making (see chart for details).   Patient presented to the emergency department today because of concerns for a mass at the anus.  Exam is consistent with an external hemorrhoid.  Discussed this with the patient.  Will discharge  with prescription for Anusol.  Encourage patient to continue follow-up with GI doctor.   ____________________________________________   FINAL CLINICAL IMPRESSION(S) / ED DIAGNOSES  Final diagnoses:  Hemorrhoids, unspecified hemorrhoid type     Note: This dictation was prepared with Dragon dictation. Any transcriptional errors that result from this process are unintentional     Nance Pear, MD 08/24/18 1746

## 2018-08-24 NOTE — ED Triage Notes (Addendum)
Patient states, "I have something hanging out of my rectum.  I don't know if it's hemorrhoids, or something ya'll need to discover.  Patient reports family history of cancer."  Patient states he first noticed area approx. 1 week ago.  Patient denies rectal bleeding.  Patient states area is painful.  Patient denies itching.  Patient is unsure if he has recently lost weight or not.  Patient states, "I've had a lot of things going on.  I had a surgery on my rotator cuff 7 weeks ago."

## 2018-08-24 NOTE — ED Notes (Signed)
Pt c/o rectal pain, pt has upcoming appt with GI on Wednesday, denies any abdominal pain.

## 2018-08-29 ENCOUNTER — Ambulatory Visit (INDEPENDENT_AMBULATORY_CARE_PROVIDER_SITE_OTHER): Payer: Medicaid Other | Admitting: General Surgery

## 2018-08-29 ENCOUNTER — Other Ambulatory Visit: Payer: Self-pay

## 2018-08-29 ENCOUNTER — Encounter: Payer: Self-pay | Admitting: General Surgery

## 2018-08-29 VITALS — BP 136/74 | HR 72 | Temp 97.9°F | Resp 14 | Ht 71.0 in | Wt 175.0 lb

## 2018-08-29 DIAGNOSIS — K648 Other hemorrhoids: Secondary | ICD-10-CM | POA: Diagnosis not present

## 2018-08-29 NOTE — Patient Instructions (Addendum)
Use cream until Sunday. Than [place]  Preparation H . How to Take a Sitz Bath A sitz bath is a warm water bath that is taken while you are sitting down. The water should only come up to your hips and should cover your buttocks. Your health care provider may recommend a sitz bath to help you:  Clean the lower part of your body, including your genital area.  With itching.  With pain.  With sore muscles or muscles that tighten or spasm.  How to take a sitz bath Take 3-4 sitz baths per day or as told by your health care provider. 1. Partially fill a bathtub with warm water. You will only need the water to be deep enough to cover your hips and buttocks when you are sitting in it. 2. If your health care provider told you to put medicine in the water, follow the directions exactly. 3. Sit in the water and open the tub drain a little. 4. Turn on the warm water again to keep the tub at the correct level. Keep the water running constantly. 5. Soak in the water for 15-20 minutes or as told by your health care provider. 6. After the sitz bath, pat the affected area dry first. Do not rub it. 7. Be careful when you stand up after the sitz bath because you may feel dizzy.  Contact a health care provider if:  Your symptoms get worse. Do not continue with sitz baths if your symptoms get worse.  You have new symptoms. Do not continue with sitz baths until you talk with your health care provider. This information is not intended to replace advice given to you by your health care provider. Make sure you discuss any questions you have with your health care provider. Document Released: 06/11/2004 Document Revised: 02/17/2016 Document Reviewed: 09/17/2014 Elsevier Interactive Patient Education  [employer].  The patient is aware to call back for any questions or concerns.

## 2018-08-29 NOTE — Progress Notes (Signed)
Patient ID: Hayden Hall, male   DOB: [birth year], 62 y.o.   MRN: [id number]  Chief Complaint  Patient presents with  . Hemorrhoids    HPI Hayden Hall is a 62 y.o. male.  HPI he presented to the emergency department over the weekend with complaints of perianal pain.  He was found to have a nonthrombosed external hemorrhoid and was referred to general surgery for follow-up.  He was prescribed Anusol.  He says he has been applying it and feels like he is getting relief. He has not been performing sitz baths. He takes chronic necrotic pain medication which he feels is also helping.  He has had hemorrhoids in the past, internal, treated with banding.  This particular hemorrhoid was apparently present about a month ago, went away on its own, and then came back again, which prompted his presentation to the emergency department.  He denies ever having noted any bleeding from his hemorrhoids.  He does report chronic constipation secondary to his medications.  He says that he normally makes himself a smoothie every day which keeps him regular, but he got off of his schedule, and became constipated.  He had several bowel movements that required straining just prior to the emergence of this hemorrhoid.  His last colonoscopy was done in [place] in 2011.  He says he has been seeing our local gastroenterologist since that time but he cannot recall the name of the doctor.   Past Medical History:  Diagnosis Date  . Anxiety   . Arthritis    RA   . Asthma   . Bipolar disorder ([place])   . BPH (benign prostatic hyperplasia)   . Bronchitis   . Cataract   . Chronic constipation   . Chronic pain   . COPD (chronic obstructive pulmonary disease) ([place])   . Depression   . Dry eye   . GERD (gastroesophageal reflux disease)   . Headache    migraines  . Hepatitis C    " had treatment " per pt  . Herpes   . History of pancreatitis   . History of seizure disorder   . Hypothyroidism   . Memory  impairment   . Myoclonic jerking   . Neck injury   . Nocturia   . Pneumonia   . Rotator cuff tear    left  . Schizophrenia ([place])   . SOB (shortness of breath)   . Trigger finger   . Urinary frequency   . Wears glasses     Past Surgical History:  Procedure Laterality Date  . COLONOSCOPY  2011  . ROTATOR CUFF REPAIR Left   . ROTATOR CUFF REPAIR Right   . SHOULDER ARTHROSCOPY Left 07/12/2018   Procedure: LEFT SHOULDER ARTHROSCOPIC ROTATOR CUFF TEAR VS. DEBRIDEMENT, SUBACROMIAL DECOMPRESSION;  Surgeon: Tania Ade, MD;  Location: [facility];  Service: Orthopedics;  Laterality: Left;  . TRIGGER FINGER RELEASE      Family History  Problem Relation Age of Onset  . Breast cancer Mother   . Kidney disease Mother   . Bladder Cancer Mother   . Heart disease Unknown   . Kidney disease Cousin   . Prostate cancer Father   . Stroke Father   . Kidney cancer Neg Hx     Social History Social History   Tobacco Use  . Smoking status: Former Smoker    Types: Cigarettes  . Smokeless tobacco: Never Used  . Tobacco comment: quit 20 years  Substance Use Topics  .  Alcohol use: No    Alcohol/week: 0.0 standard drinks  . Drug use: No    Allergies  Allergen Reactions  . Vesicare [Solifenacin] Other (See Comments)    Dizziness     Current Outpatient Medications  Medication Sig Dispense Refill  . b complex vitamins tablet Take 1 tablet by mouth 2 (two) times daily.    . Cholecalciferol (VITAMIN D PO) Take 1 capsule by mouth daily.    . cyclobenzaprine (FLEXERIL) 10 MG tablet Take 1 tablet (10 mg total) by mouth 3 (three) times daily as needed. 60 tablet 2  . Diphenhyd-Hydrocort-Nystatin (FIRST-DUKES MOUTHWASH MT) 1 Dose by Mouth Rinse route daily as needed (mouth sores).   6  . hydrocortisone (ANUSOL-HC) 2.5 % rectal cream Place 1 application rectally 2 (two) times daily. 30 g 0  . levothyroxine (SYNTHROID, LEVOTHROID) 25 MCG tablet Take 25 mcg by mouth daily.  4  . Milk Thistle 1000  MG CAPS Take 1,000 mg by mouth 3 (three) times daily.    . Misc Natural Products (TURMERIC CURCUMIN) CAPS Take 1 capsule by mouth 2 (two) times daily.    . Olopatadine HCl (PAZEO) 0.7 % SOLN Place 1 drop into both eyes daily as needed (dry eye).    . Omeprazole (RA OMEPRAZOLE) 20 MG TBEC Take 20 mg by mouth daily as needed (acid reflux).     Marland Kitchen oxycodone (OXY-IR) 5 MG capsule Take 1 capsule (5 mg total) by mouth 2 (two) times daily as needed. 60 capsule 0  . POTASSIUM PO Take 1 tablet by mouth daily.    . valACYclovir (VALTREX) 500 MG tablet Take 500 mg by mouth daily as needed (fever blisters).      No current facility-administered medications for this visit.     Review of Systems Review of Systems  All other systems reviewed and are negative.   Blood pressure 136/74, pulse 72, temperature 97.9 F (36.6 C), temperature source Skin, resp. rate 14, height 5\' 11"  (1.803 m), weight 175 lb (79.4 kg), SpO2 98 %.  Physical Exam Physical Exam  Constitutional: He is oriented to person, place, and time. He appears well-developed and well-nourished. No distress.  HENT:  Head: Normocephalic and atraumatic.  Mouth/Throat: Oropharynx is clear and moist. No oropharyngeal exudate.  Eyes: Pupils are equal, round, and reactive to light. Right eye exhibits no discharge. Left eye exhibits no discharge. No scleral icterus.  Neck: Normal range of motion. Neck supple. No tracheal deviation present. No thyromegaly present.  Cardiovascular: Normal rate, regular rhythm and normal heart sounds.  Pulmonary/Chest: Effort normal and breath sounds normal.  Abdominal: Soft. Bowel sounds are normal.  Genitourinary: Rectal exam shows external hemorrhoid.     Genitourinary Comments: Flat, non-thrombosed external hemorrhoid present. No fluctuance or masses appreciated on rectal exam. No stool in vault. No blood on glove. No other masses appreciated.  Musculoskeletal: He exhibits no edema or deformity.    Lymphadenopathy:    He has no cervical adenopathy.  Neurological: He is alert and oriented to person, place, and time.  Skin: Skin is warm and dry.  Psychiatric: His behavior is normal. Thought content normal.    Data Reviewed EMR from ED visit reviewed. Prior colonoscopy report not available.  Assessment    62 y/o M with non-thrombosed external hemorrhoid. It appears to be responding well to conservative management.    Plan    Continue Anusol through Sunday, then can switch to OTC Preparation H, as needed. Tuck's pads PRN Advised high-fiber diet/avoiding constipation  and straining. Sitz baths Colonoscopy RTC PRN.       Fredirick Maudlin 08/29/2018, 2:32 PM

## 2018-09-04 ENCOUNTER — Telehealth: Payer: Self-pay | Admitting: Student in an Organized Health Care Education/Training Program

## 2018-09-04 ENCOUNTER — Ambulatory Visit: Payer: Medicaid Other | Attending: Orthopedic Surgery | Admitting: Physical Therapy

## 2018-09-04 ENCOUNTER — Encounter: Payer: Self-pay | Admitting: Physical Therapy

## 2018-09-04 DIAGNOSIS — M25512 Pain in left shoulder: Secondary | ICD-10-CM

## 2018-09-04 DIAGNOSIS — R531 Weakness: Secondary | ICD-10-CM | POA: Insufficient documentation

## 2018-09-04 NOTE — Telephone Encounter (Signed)
Spoke with patient.  He wants Dr Holley Raring to give his approval to proceed with neck surgery.  Informed patient that he needed to schedule an appointment for this.  Call transferred to secretary.

## 2018-09-04 NOTE — Therapy (Signed)
[place], [place], [address] Phone: [phone]   Fax:  [phone]  Physical Therapy Treatment  Patient Details  Name: Hayden Hall MRN: [id number] Date of Birth: 30-Jun-1956 Referring Provider (PT): Fermin Schwab   Encounter Date: 09/04/2018  PT End of Session - 09/04/18 1314    Visit Number  3    Number of Visits  12    Date for PT Re-Evaluation  10/22/18    Authorization Type  MCD    Authorization Time Period  08/21/2018 through 09/19/2018    Authorization - Visit Number  2    Authorization - Number of Visits  3    PT Start Time  8850    PT Stop Time  1202    PT Time Calculation (min)  59 min    Activity Tolerance  Patient tolerated treatment well    Behavior During Therapy  [facility] for tasks assessed/performed       Past Medical History:  Diagnosis Date  . Anxiety   . Arthritis    RA   . Asthma   . Bipolar disorder ([place])   . BPH (benign prostatic hyperplasia)   . Bronchitis   . Cataract   . Chronic constipation   . Chronic pain   . COPD (chronic obstructive pulmonary disease) ([place])   . Depression   . Dry eye   . GERD (gastroesophageal reflux disease)   . Headache    migraines  . Hepatitis C    " had treatment " per pt  . Herpes   . History of pancreatitis   . History of seizure disorder   . Hypothyroidism   . Memory impairment   . Myoclonic jerking   . Neck injury   . Nocturia   . Pneumonia   . Rotator cuff tear    left  . Schizophrenia ([place])   . SOB (shortness of breath)   . Trigger finger   . Urinary frequency   . Wears glasses     Past Surgical History:  Procedure Laterality Date  . COLONOSCOPY  2011  . ROTATOR CUFF REPAIR Left   . ROTATOR CUFF REPAIR Right   . SHOULDER ARTHROSCOPY Left 07/12/2018   Procedure: LEFT SHOULDER ARTHROSCOPIC ROTATOR CUFF TEAR VS. DEBRIDEMENT, SUBACROMIAL DECOMPRESSION;  Surgeon: Tania Ade, MD;  Location: [facility];  Service:  Orthopedics;  Laterality: Left;  . TRIGGER FINGER RELEASE      There were no vitals filed for this visit.  Subjective Assessment - 09/04/18 1111    Subjective  He has had migraine headaches every night for the last 3 night.  Idon't  want to do the interview  I want to get right to the exercises.  pain is the same.     Currently in Pain?  Yes    Pain Score  7     Pain Location  Finger (Comment which one)   last 2 numb and burning constant   Pain Orientation  Left    Pain Descriptors / Indicators  Sharp;Tightness;Numbness;Burning    Pain Type  Acute pain    Pain Radiating Towards  into last 2 fingers    Pain Frequency  Intermittent    Aggravating Factors   sleeping on it,      Pain Relieving Factors  medicine,  handing off bed  warm shower.           [facility] PT Assessment - 09/04/18 0001      PROM  Left Shoulder Flexion  138 Degrees   143 cane   Left Shoulder ABduction  90 Degrees                   [facility] Adult PT Treatment/Exercise - 09/04/18 0001      Shoulder Exercises: Supine   External Rotation  AAROM;Left;15 reps   with cane   Internal Rotation  AAROM;Left;10 reps;15 reps   with cane   Flexion  AAROM;15 reps   with cane   Other Supine Exercises  retraction  10 X       Shoulder Exercises: Pulleys   Flexion  3 minutes   cued pain free,  shoulder " crunches"  noted. by patient       Modalities   Modalities  Vasopneumatic      Vasopneumatic   Number Minutes Vasopneumatic   15 minutes    Vasopnuematic Location   Shoulder    Vasopneumatic Pressure  Medium      Manual Therapy   Manual Therapy  Passive ROM   rom improving with reps.    Manual therapy comments  soft tissue work teres peri scapular and deltoid  trigger point release upper trap and   tres.  tissue softened.               PT Education - 09/04/18 1314    Education provided  Yes    Education Details  Precautions,  exercise form  how to do soft tissue work    [employer]) Educated   Patient    Methods  Explanation;Demonstration;Verbal cues;Tactile cues    Comprehension  Verbalized understanding       PT Short Term Goals - 09/04/18 1320      PT SHORT TERM GOAL #1   Title  Pt will be I and compliant with initial HEP. 4 weeks 08/22/18    Baseline  some compliance when he thinks about it.    Period  Weeks    Status  On-going      PT SHORT TERM GOAL #2   Title  Pt will understand protocol and to ice shoulder for pain.     Baseline  understands ice,  protocol continues to need review    Period  Weeks    Status  Partially Met      PT SHORT TERM GOAL #3   Status  Deferred        PT Long Term Goals - 07/30/18 1716      PT LONG TERM GOAL #1   Title  Pt will be independent with HEP in order to decrease pain and resume full function at home. 12 weeks 10/22/17    Baseline  no HEP until today    Time  12    Period  Weeks    Status  New      PT LONG TERM GOAL #2   Title  Pt will demonstrate full R shoulder AROM/PROM when compared to L shoulder in order to be able to perform all self care and household responsibilities. 12 weeks 10/22/17    Baseline  no AROM allowed per protocol, PROM flex to 90, abd to 80, ER to 20    Time  12    Period  Weeks    Status  New      PT LONG TERM GOAL #3   Title  Pt will demonstrate full R shoulder strength when compared to L shoulder in order to be able to perform all self-care and household responsibilities.  12 weeks 10/22/17    Baseline  2/5 MMT    Time  12    Period  Weeks    Status  New      PT LONG TERM GOAL #4   Title  Pt will report worst pain no greater than 3/10 in order to demonstrate clinically significant reduction in pain to improve function at home. 12 weeks 10/22/17    Baseline  7/10    Time  12    Period  Weeks    Status  New            Plan - 09/04/18 1317    Clinical Impression Statement  138 PROM flexion.  left shoulder.  Manual seemed most helpful prior to stretching.  moderate to min cues needed  with exercise. STG#2 partially met. .  7/10 pain at end of session in shoulder and hand prior to modalities.    PT Next Visit Plan   Needs medicaid renewal next visit  has used 2 out of 3 approved visits.  Review precautions.  progress HEP for AROM as tolerated, table top flexion vs wall flexion, cont scapular strengthening    PT Home Exercise Plan  scap retract, supine cane flex, press up, ER, prone row and side ER to neutral    Consulted and Agree with Plan of Care  Patient       Patient will benefit from skilled therapeutic intervention in order to improve the following deficits and impairments:     Visit Diagnosis: Acute pain of left shoulder  General weakness     Problem List Patient Active Problem List   Diagnosis Date Noted  . 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 Osteoarthritis of spine with radiculopathy, cervical region 02/13/2018  . Cervicalgia 12/07/2017  . History of rotator cuff surgery 12/07/2017  . Cervical radiculopathy 12/07/2017  . DDD (degenerative disc disease), cervical 12/07/2017  . Chronic pain syndrome 12/07/2017  . BPH with obstruction/lower urinary tract symptoms 10/21/2015    Prabhleen Montemayor  PTA 09/04/2018, 1:23 PM  [facility] [address] [place], [place], [address] Phone: [phone]   Fax:  [phone]  Name: Hayden Hall MRN: [id number] Date of Birth: September 25, 1956

## 2018-09-04 NOTE — Telephone Encounter (Signed)
Pt has questions about the pain in his neck and is also having headaches. I offered pt an appt but he wanted to speak to a nurse to see what Dr. Holley Raring would recommend.

## 2018-09-10 ENCOUNTER — Telehealth: Payer: Self-pay

## 2018-09-10 ENCOUNTER — Ambulatory Visit: Payer: Medicaid Other | Admitting: Physical Therapy

## 2018-09-10 DIAGNOSIS — M25512 Pain in left shoulder: Secondary | ICD-10-CM | POA: Diagnosis not present

## 2018-09-10 DIAGNOSIS — R531 Weakness: Secondary | ICD-10-CM

## 2018-09-10 NOTE — Therapy (Addendum)
[place], [place], [address] Phone: [phone]   Fax:  [phone]  Physical Therapy Treatment/MCD reauthorization/Discharge Addendum  Patient Details  Name: Hayden Hall MRN: [id number] Date of Birth: [birth year] Referring Provider (PT): Fermin Schwab   Encounter Date: 09/10/2018  PT End of Session - 09/10/18 0916    Visit Number  4    Number of Visits  12    Date for PT Re-Evaluation  10/22/18    Authorization Type  MCD    Authorization Time Period  resubmitted 09/10/18,     Authorization - Visit Number  3    Authorization - Number of Visits  3    PT Start Time  0845    PT Stop Time  0940    PT Time Calculation (min)  55 min    Activity Tolerance  Patient tolerated treatment well    Behavior During Therapy  [facility] for tasks assessed/performed       Past Medical History:  Diagnosis Date  . Anxiety   . Arthritis    RA   . Asthma   . Bipolar disorder ([place])   . BPH (benign prostatic hyperplasia)   . Bronchitis   . Cataract   . Chronic constipation   . Chronic pain   . COPD (chronic obstructive pulmonary disease) ([place])   . Depression   . Dry eye   . GERD (gastroesophageal reflux disease)   . Headache    migraines  . Hepatitis C    " had treatment " per pt  . Herpes   . History of pancreatitis   . History of seizure disorder   . Hypothyroidism   . Memory impairment   . Myoclonic jerking   . Neck injury   . Nocturia   . Pneumonia   . Rotator cuff tear    left  . Schizophrenia ([place])   . SOB (shortness of breath)   . Trigger finger   . Urinary frequency   . Wears glasses     Past Surgical History:  Procedure Laterality Date  . COLONOSCOPY  2011  . ROTATOR CUFF REPAIR Left   . ROTATOR CUFF REPAIR Right   . SHOULDER ARTHROSCOPY Left 07/12/2018   Procedure: LEFT SHOULDER ARTHROSCOPIC ROTATOR CUFF TEAR VS. DEBRIDEMENT, SUBACROMIAL DECOMPRESSION;  Surgeon: Tania Ade, MD;   Location: [facility];  Service: Orthopedics;  Laterality: Left;  . TRIGGER FINGER RELEASE      There were no vitals filed for this visit.  Subjective Assessment - 09/10/18 0903    Subjective  Pt relays his shoulder is coming along, he relays 30% improvment since starting PT    Pertinent History  hx of neck and back pain, also has Rt RTC repair    Limitations  Lifting;House hold activities    Patient Stated Goals  know what to do for his shoulder    Currently in Pain?  Yes    Pain Score  7     Pain Location  Shoulder    Pain Orientation  Left    Pain Descriptors / Indicators  Sharp;Aching;Tightness         [facility] PT Assessment - 09/10/18 0001      Assessment   Medical Diagnosis  Lt RTC repair    Referring Provider (PT)  Fermin Schwab    Onset Date/Surgical Date  07/12/18    Next MD Visit  ?      ROM / Strength   AROM / PROM /  Strength  AROM;PROM;Strength      AROM   AROM Assessment Site  Shoulder    Right/Left Shoulder  Left    Left Shoulder Extension  --   [facility]   Left Shoulder Flexion  95 Degrees    Left Shoulder ABduction  90 Degrees    Left Shoulder Internal Rotation  --   [facility]   Left Shoulder External Rotation  --   [facility]     PROM   Left Shoulder Flexion  150 Degrees    Left Shoulder ABduction  140 Degrees    Left Shoulder External Rotation  80 Degrees      Strength   Overall Strength Comments  3+/5 MMT Lt shouder strength all planes                   [facility] Adult PT Treatment/Exercise - 09/10/18 0001      Exercises   Exercises  Shoulder      Shoulder Exercises: Supine   External Rotation  AAROM;Left;15 reps    Internal Rotation  AAROM;Left;10 reps;15 reps    Flexion  AAROM;15 reps      Shoulder Exercises: Standing   Retraction  20 reps      Shoulder Exercises: Pulleys   Flexion  3 minutes    Scaption  2 minutes      Shoulder Exercises: ROM/Strengthening   [place]  flexion X 20    Other ROM/Strengthening Exercises  wall ladder flexion X 5       Shoulder Exercises: Isometric Strengthening   External Rotation  5X10"    Internal Rotation  5X10"      Modalities   Modalities  Vasopneumatic      Vasopneumatic   Number Minutes Vasopneumatic   15 minutes    Vasopnuematic Location   Shoulder    Vasopneumatic Pressure  Medium      Manual Therapy   Manual Therapy  Passive ROM    Manual therapy comments  soft tissue work teres peri scapular and deltoid  trigger point release upper trap and   tres.  tissue softened.                 PT Short Term Goals - 09/10/18 0940      PT SHORT TERM GOAL #1   Title  Pt will be I and compliant with initial HEP. 4 weeks 08/22/18    Baseline  now met    Status  Achieved      PT SHORT TERM GOAL #2   Title  Pt will understand protocol and to ice shoulder for pain.     Baseline  understands, now met    Status  Achieved        PT Long Term Goals - 09/10/18 0941      PT LONG TERM GOAL #1   Title  Pt will be independent with HEP in order to decrease pain and resume full function at home. 12 weeks 10/22/17    Baseline  independent with beginning HEP    Status  On-going      PT LONG TERM GOAL #2   Title  Pt will demonstrate full R shoulder AROM/PROM when compared to L shoulder in order to be able to perform all self care and household responsibilities. 12 weeks 10/22/17    Baseline  Lt shoulder AROM flexion 95 deg, abd 90 deg, PROM is WFL    Status  On-going      PT LONG TERM GOAL #3  Title  Pt will demonstrate full R shoulder strength when compared to L shoulder in order to be able to perform all self-care and household responsibilities. 12 weeks 10/22/17    Baseline  3+/5 MMT    Status  On-going      PT LONG TERM GOAL #4   Title  Pt will report worst pain no greater than 3/10 in order to demonstrate clinically significant reduction in pain to improve function at home. 12 weeks 10/22/17    Baseline  7/10    Status  On-going            Plan - 09/10/18 0944    Clinical  Impression Statement  Pt improving as expected with ROM and strength but he still has deficits in these areas. He has now met STG and is slowly progressing toward LTG. He will continue to benefit from skilled PT to address his deficits. Only barrier to progress has been him not being able to attend PT frequently due to not living close by. He relays he is now staying closer by and will be albe to attend more regularly.     Rehab Potential  Good    Clinical Impairments Affecting Rehab Potential  pt not living near PT facility    PT Frequency  1x / week    PT Duration  4 weeks    PT Treatment/Interventions  ADLs/Self Care Home Management;Neuromuscular re-education;Therapeutic exercise;Patient/family education;Cryotherapy;Electrical Stimulation;Moist Heat;Iontophoresis 79m/ml Dexamethasone;Ultrasound;Manual techniques;Passive range of motion;Dry needling;Taping;Vasopneumatic Device;Joint Manipulations    PT Next Visit Plan  check MCD renewal, progress strength and ROM as tolerated    PT Home Exercise Plan  scap retract, supine cane flex, press up, ER, prone row and side ER to neutral    Consulted and Agree with Plan of Care  Patient       Patient will benefit from skilled therapeutic intervention in order to improve the following deficits and impairments:  Decreased activity tolerance, Decreased endurance, Decreased range of motion, Decreased strength, Hypomobility, Impaired flexibility, Increased fascial restricitons, Increased muscle spasms, Pain, Postural dysfunction  Visit Diagnosis: Acute pain of left shoulder  General weakness     Problem List Patient Active Problem List   Diagnosis Date Noted  . 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 Osteoarthritis of spine with radiculopathy, cervical region 02/13/2018  . Cervicalgia 12/07/2017  . History of rotator cuff surgery 12/07/2017  . Cervical radiculopathy 12/07/2017  . DDD (degenerative disc disease), cervical 12/07/2017   . Chronic pain syndrome 12/07/2017  . BPH with obstruction/lower urinary tract symptoms 10/21/2015    BDebbe Odea PT,DPT 09/10/2018, 10:00 AM  PHYSICAL THERAPY DISCHARGE SUMMARY  Visits from Start of Care: 4  Current functional level related to goals / functional outcomes: See above   Remaining deficits: See above   Education / Equipment: HEP Plan: Patient agrees to discharge.  Patient goals were not met. Patient is being discharged due to not returning since the last visit.  ?????    BElsie Ra PT, DPT 12/17/18 2:20 PM   [facility]GMor[address] [place] [address]Phone: [phone]  Fax:  [phone] Name: MTamotsu WiederholtMRN: [id number] of Birth: [birth year]

## 2018-09-10 NOTE — Telephone Encounter (Signed)
Patient showed up at front office and wanted to waste his pills.  Oxycodone 5 mg #13, wasted witnessed by patient and CFabio Neighbors rn.  Patient requests that we cancel any future prescriptions that he has at [facility] [address].  Spoke with pharmacy and cancelled script.

## 2018-09-11 ENCOUNTER — Ambulatory Visit: Payer: Medicaid Other | Admitting: Student in an Organized Health Care Education/Training Program

## 2018-10-02 ENCOUNTER — Telehealth: Payer: Self-pay | Admitting: Student in an Organized Health Care Education/Training Program

## 2018-10-02 NOTE — Telephone Encounter (Signed)
Patient has another question, says his papers he received, the nurse put in something about he is not back to work yet. Wants to know if he needs to come in to see Dr. Holley Raring to be released to drive. His other phys. Released him to drive. Would like to speak with nurse about this.

## 2018-10-02 NOTE — Telephone Encounter (Signed)
Spoke with patient and informed him that if he needed anything, to call the office.  Informed him that whichever MD stopped him from driving is the one who should reinstate him driving.

## 2018-11-08 ENCOUNTER — Encounter: Payer: Medicaid Other | Admitting: Student in an Organized Health Care Education/Training Program

## 2019-01-01 IMAGING — CR DG SHOULDER 2+V*L*
1 series · 3 of 3 positions shown · non-contrast
Comparison: 06/20/2014

CLINICAL DATA: Left shoulder pain.  Rotator cuff surgery

EXAM:
LEFT SHOULDER - 2+ VIEW

[Series 1: dg shoulder left · 0.14mm/px · 3 of 3 slices shown]
[im 1/3]
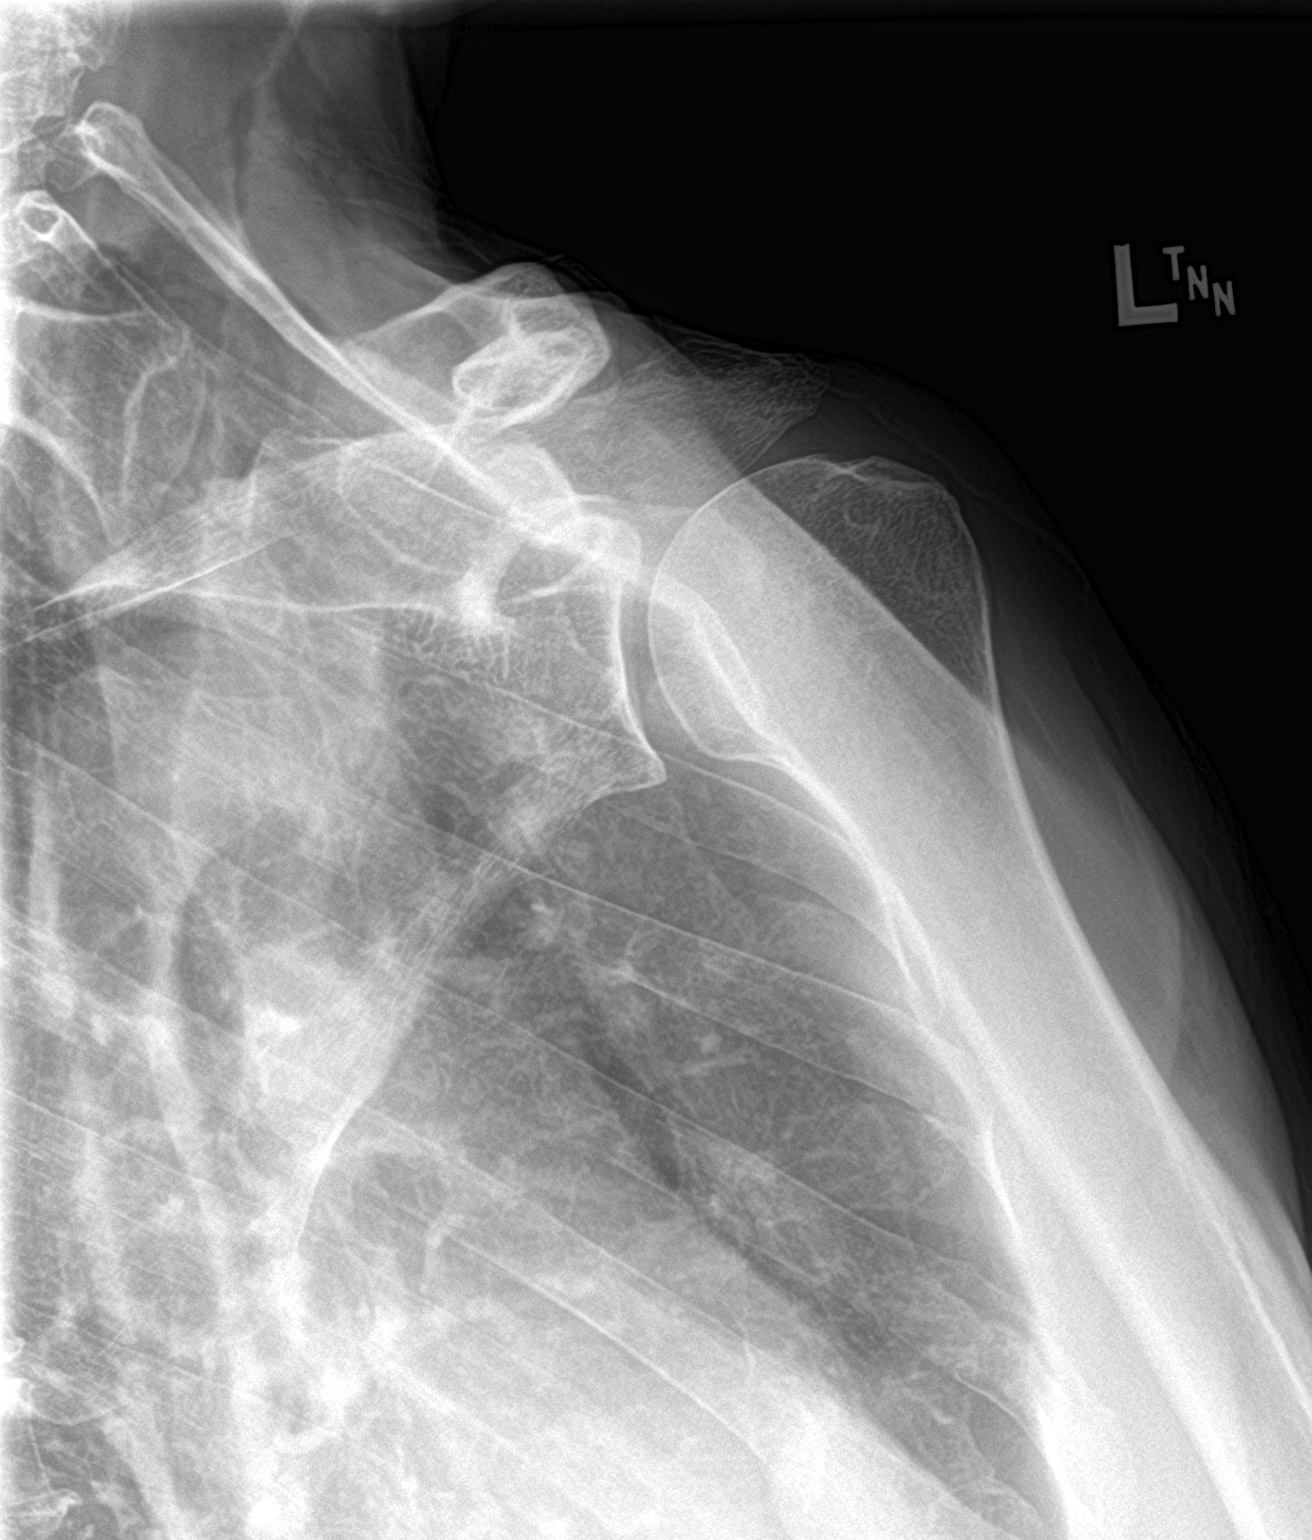
[im 2/3]
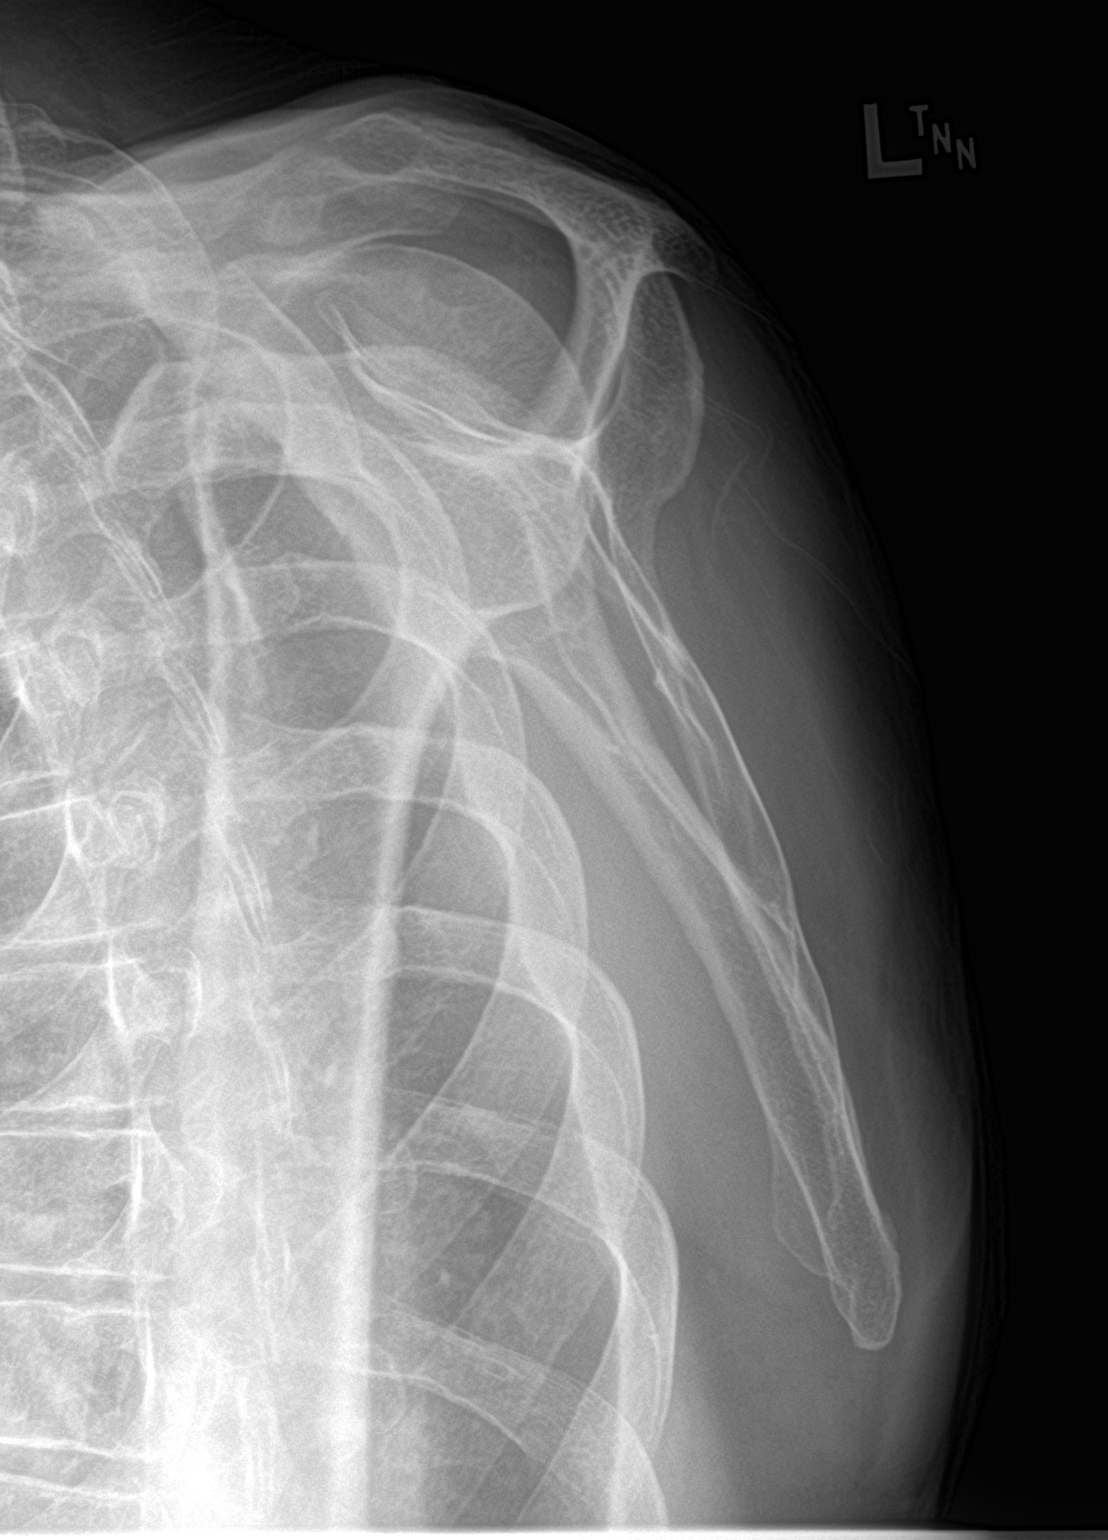
[im 3/3]
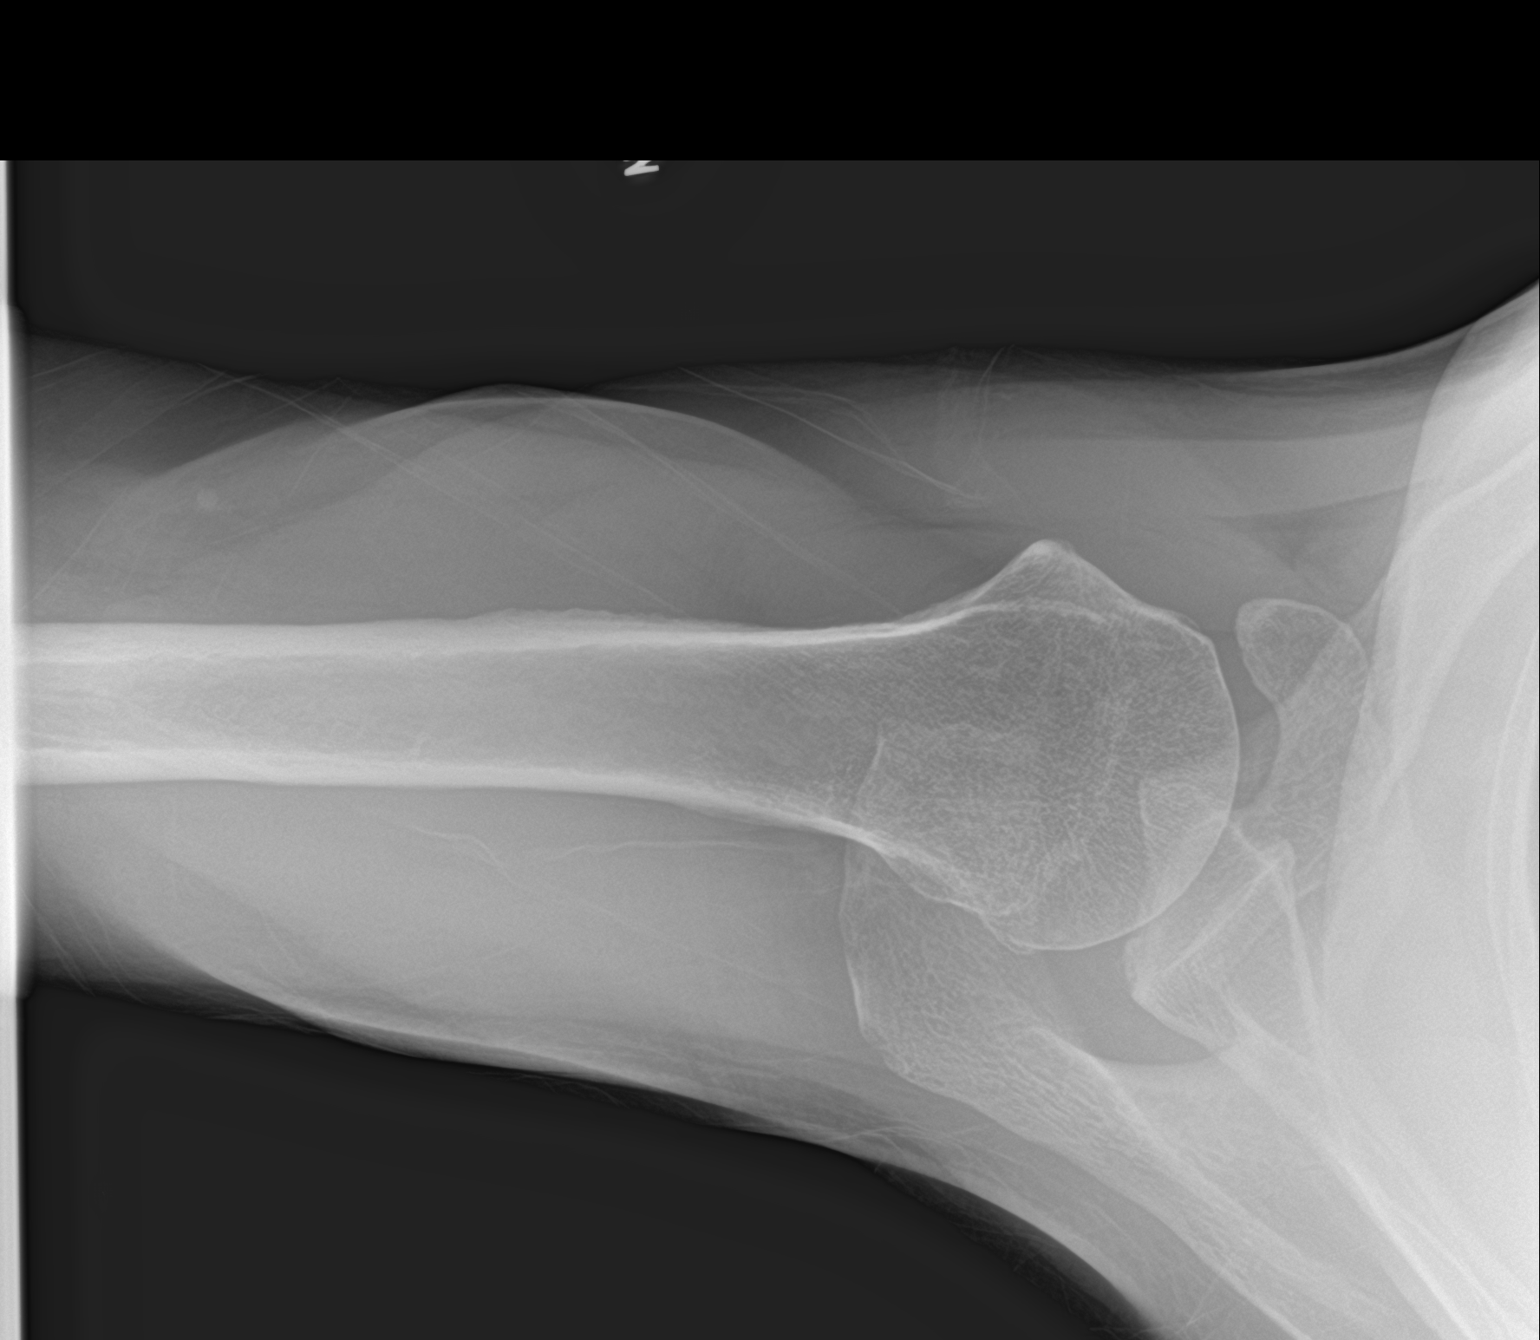

[3 of 3 positions shown; findings below may reference images not displayed]

FINDINGS: There is no evidence of fracture or dislocation. There is no
evidence of arthropathy or other focal bone abnormality. Soft
tissues are unremarkable.
IMPRESSION: Negative.

## 2019-01-01 IMAGING — CR DG SHOULDER 2+V*R*
1 series · 3 of 3 positions shown · non-contrast
Comparison: Right shoulder MRI 12/28/2017

CLINICAL DATA: Right shoulder pain. History of rotator cuff surgery

EXAM:
RIGHT SHOULDER - 2+ VIEW

[Series 1: dg shoulder right · 0.14mm/px · 3 of 3 slices shown]
[im 1/3]
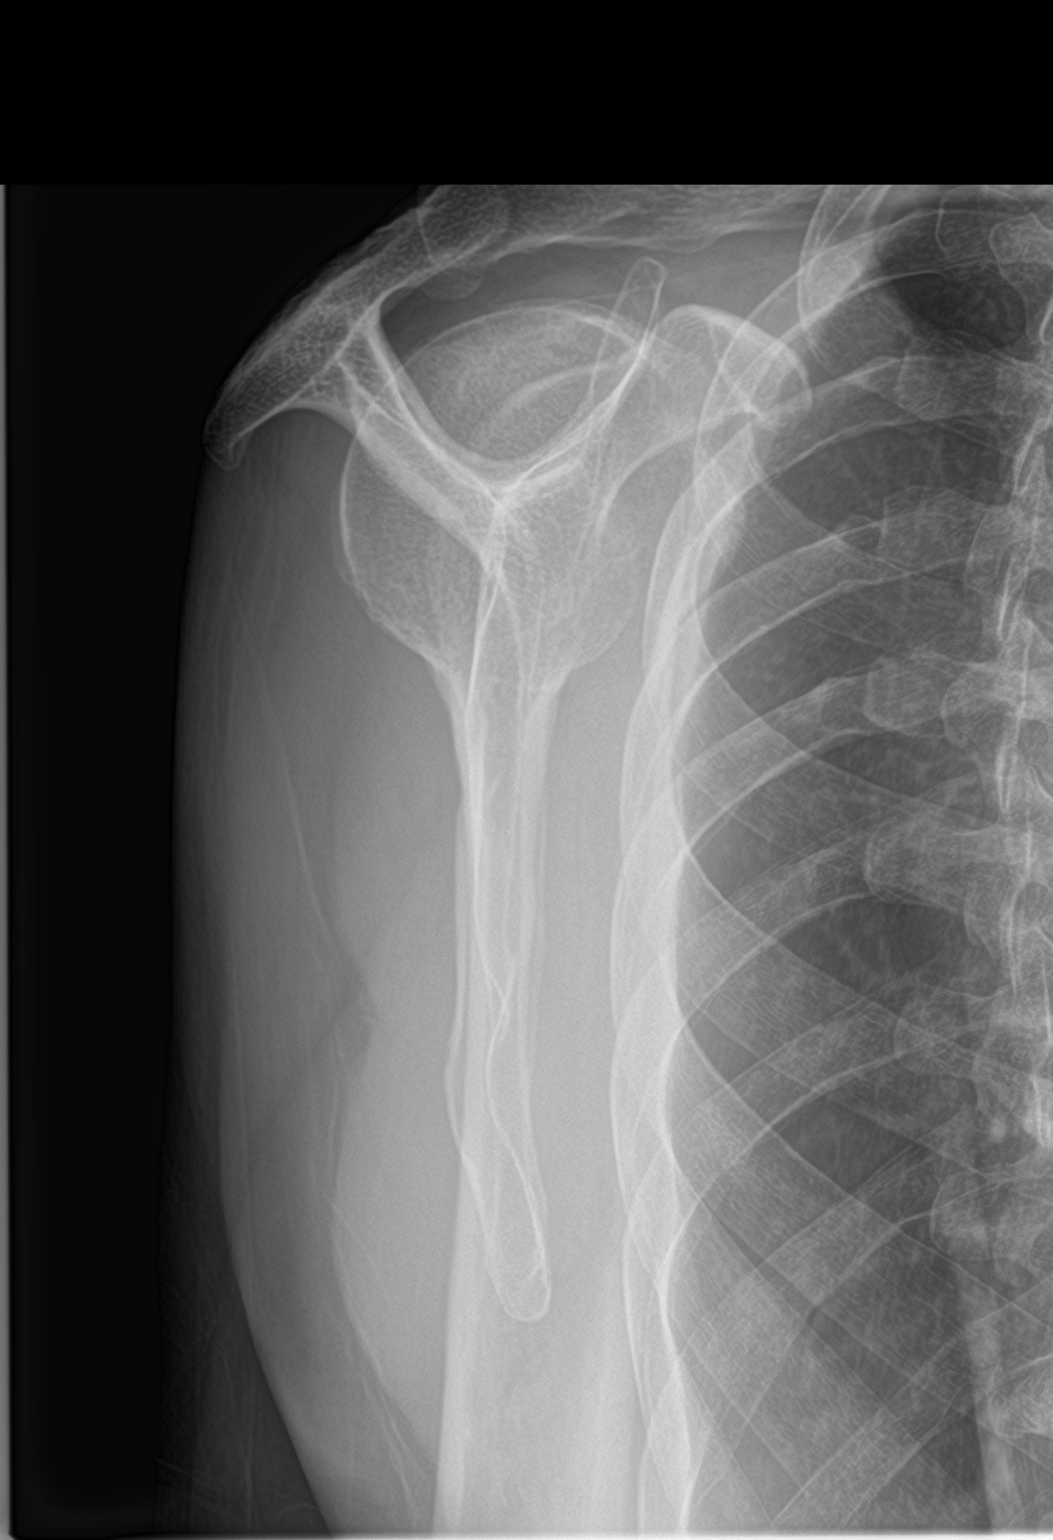
[im 2/3]
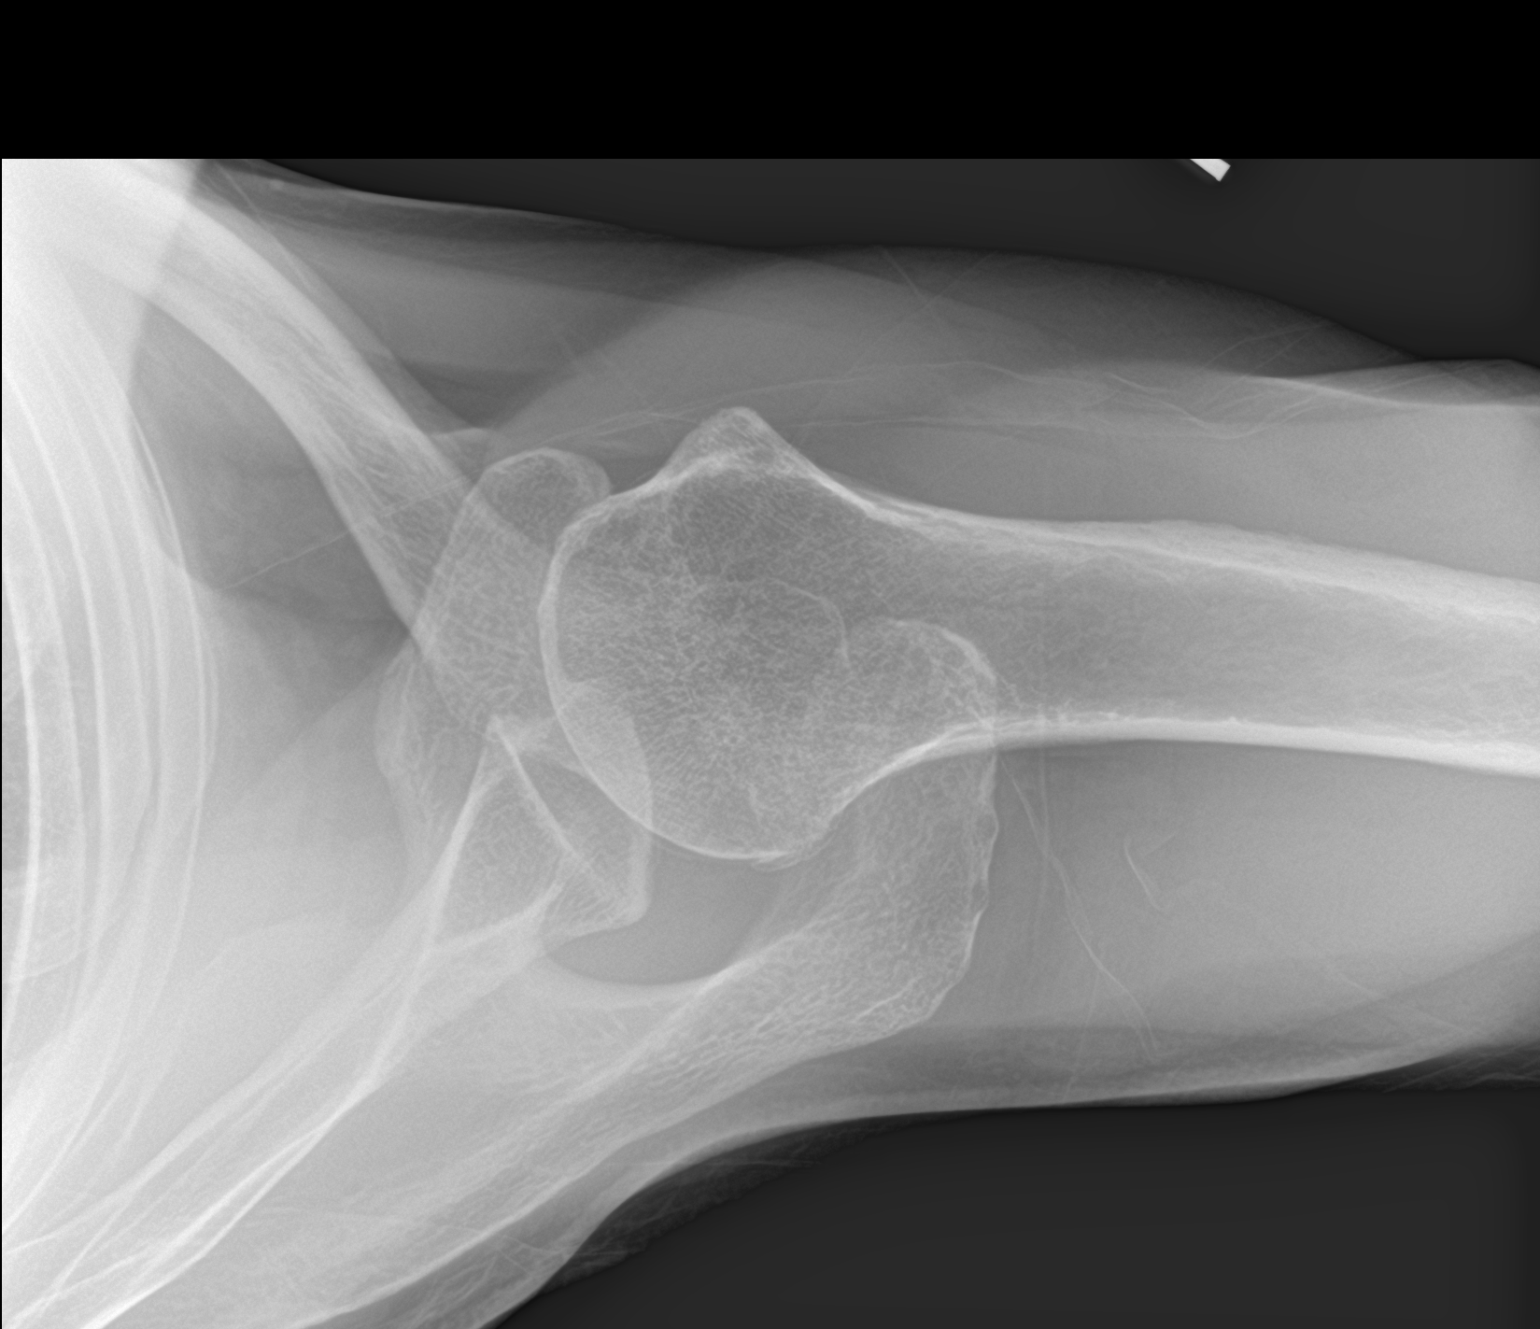
[im 3/3]
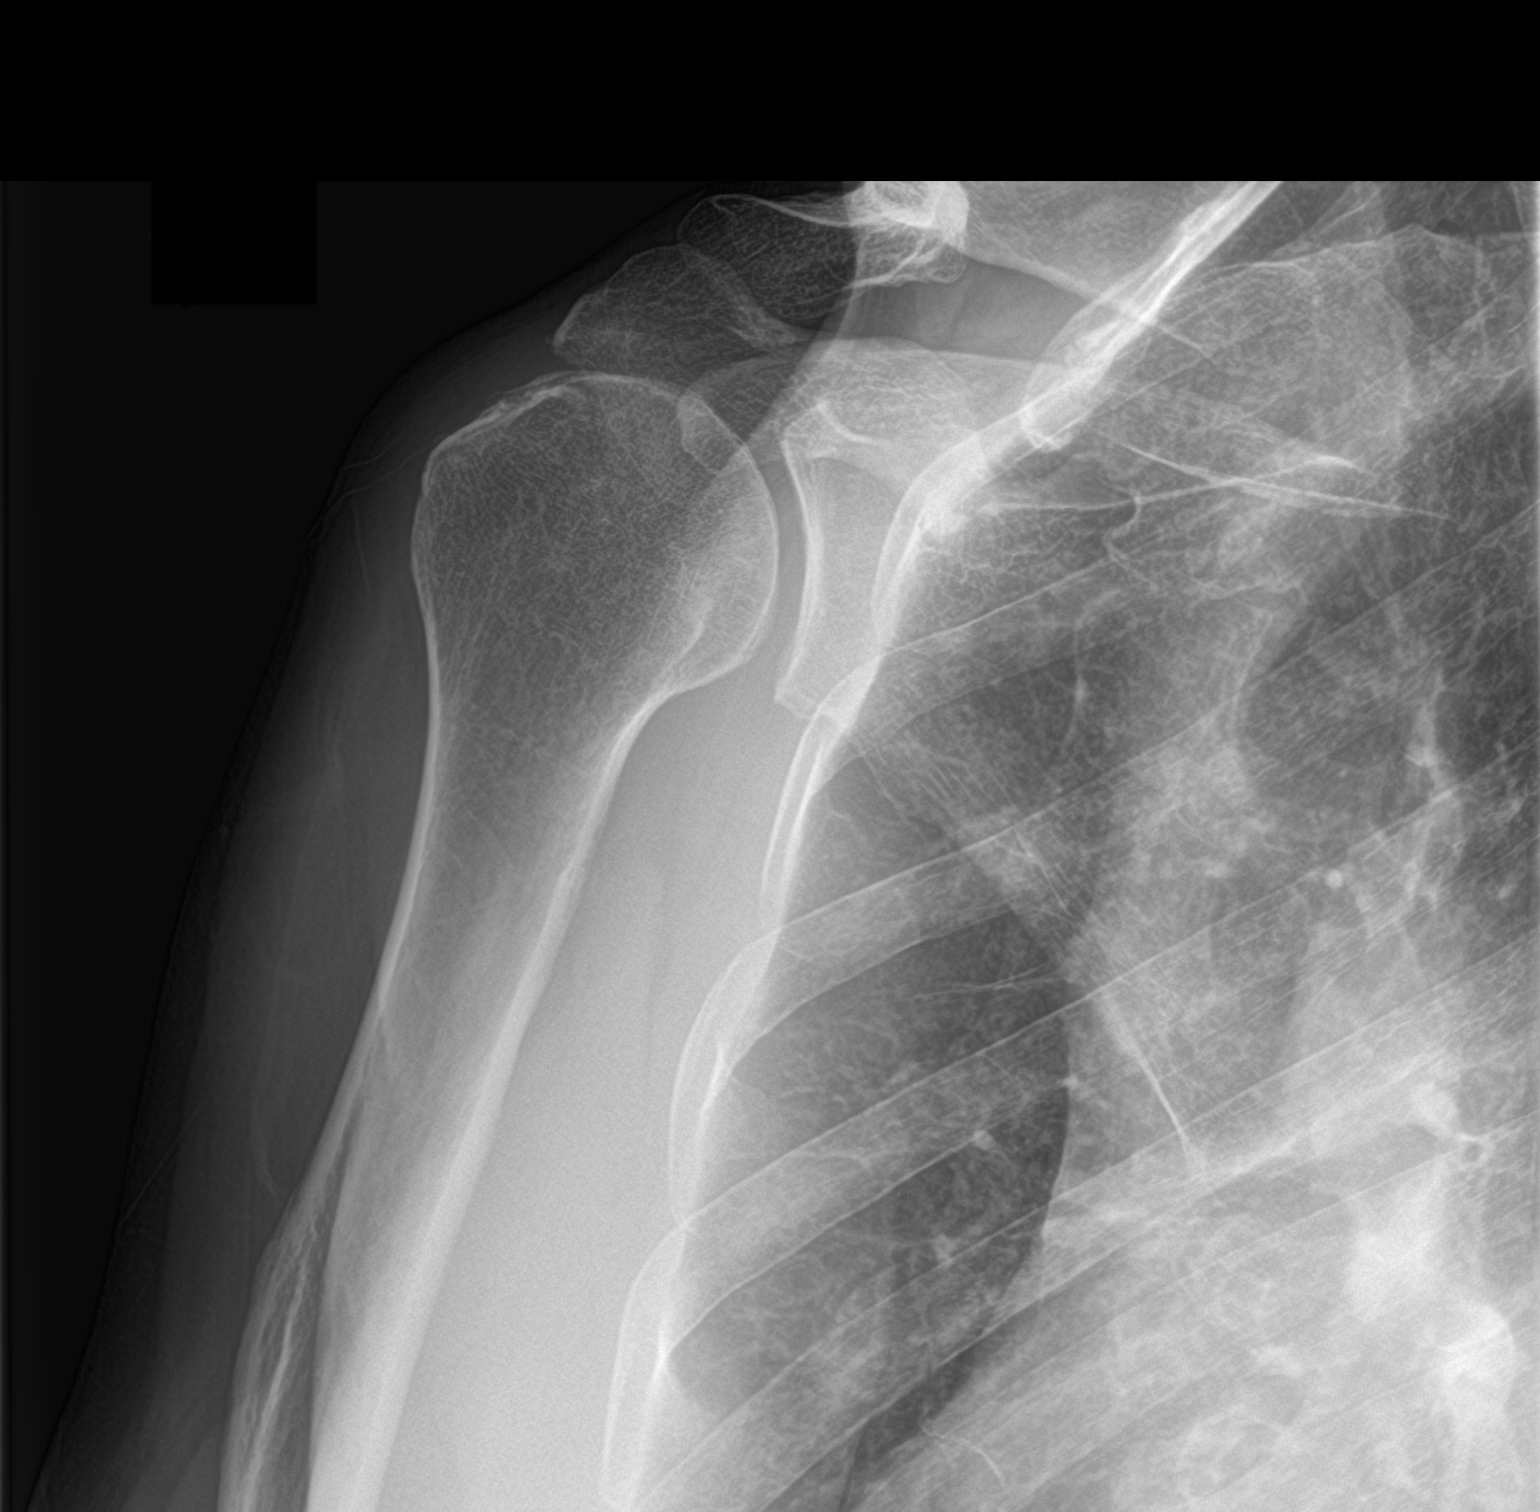

[3 of 3 positions shown; findings below may reference images not displayed]

FINDINGS: Normal alignment. Negative for fracture. Mild AC degenerative
change.
IMPRESSION: Mild AC degeneration.

## 2019-10-02 ENCOUNTER — Other Ambulatory Visit: Payer: Self-pay | Admitting: Family Medicine

## 2019-10-02 DIAGNOSIS — M25522 Pain in left elbow: Secondary | ICD-10-CM

## 2020-03-27 ENCOUNTER — Other Ambulatory Visit: Payer: Self-pay | Admitting: Orthopedic Surgery

## 2020-03-27 DIAGNOSIS — M542 Cervicalgia: Secondary | ICD-10-CM

## 2020-04-19 ENCOUNTER — Other Ambulatory Visit: Payer: Self-pay

## 2020-04-19 ENCOUNTER — Ambulatory Visit
Admission: RE | Admit: 2020-04-19 | Discharge: 2020-04-19 | Disposition: A | Discharge status: Home / Self Care | Payer: Medicare Other | Source: Ambulatory Visit | Attending: Orthopedic Surgery | Admitting: Orthopedic Surgery

## 2020-04-19 DIAGNOSIS — M542 Cervicalgia: Secondary | ICD-10-CM

## 2020-05-05 ENCOUNTER — Ambulatory Visit: Payer: Medicare Other | Admitting: Gastroenterology

## 2020-05-11 ENCOUNTER — Other Ambulatory Visit: Payer: Self-pay

## 2020-05-12 ENCOUNTER — Encounter: Payer: Self-pay | Admitting: Gastroenterology

## 2020-05-12 ENCOUNTER — Ambulatory Visit: Payer: Medicare Other | Admitting: Gastroenterology

## 2020-05-12 ENCOUNTER — Other Ambulatory Visit: Payer: Self-pay

## 2020-05-12 ENCOUNTER — Ambulatory Visit (INDEPENDENT_AMBULATORY_CARE_PROVIDER_SITE_OTHER): Payer: Medicare Other | Admitting: Gastroenterology

## 2020-05-12 VITALS — BP 114/66 | HR 65 | Temp 97.8°F | Ht 72.0 in | Wt 182.2 lb

## 2020-05-12 DIAGNOSIS — Z1211 Encounter for screening for malignant neoplasm of colon: Secondary | ICD-10-CM

## 2020-05-12 DIAGNOSIS — R1013 Epigastric pain: Secondary | ICD-10-CM

## 2020-05-12 DIAGNOSIS — K2270 Barrett's esophagus without dysplasia: Secondary | ICD-10-CM | POA: Diagnosis not present

## 2020-05-12 DIAGNOSIS — R1084 Generalized abdominal pain: Secondary | ICD-10-CM

## 2020-05-12 MED ORDER — NA SULFATE-K SULFATE-MG SULF 17.5-3.13-1.6 GM/177ML PO SOLN: ORAL | 0 refills | Status: DC

## 2020-05-12 MED ORDER — OMEPRAZOLE 20 MG PO TBEC: 20.0000 mg | DELAYED_RELEASE_TABLET | Freq: Every day | ORAL | 0 refills | Status: DC

## 2020-05-12 NOTE — Progress Notes (Signed)
Hayden Hall [address]  [place]  [place], [place] [address]  Main: [phone]  Fax: [phone]   Gastroenterology Consultation  Referring Provider:     Theotis Burrow* Primary Care Physician:  Theotis Burrow, MD Reason for Consultation:   Abdominal pain        HPI:    Chief Complaint  Patient presents with  . New Patient (Initial Visit)  . Hiatal Hernia    Patient stated that he has had the hiatal hernia for years.    Hayden Hall is a 64 y.o. y/o male referred for consultation & management  by Dr. Alene Mires, Elyse Jarvis, MD.  Patient reports 1 year history of abdominal pain, epigastric and periumbilical area.  States upper endoscopy that was done last year did not show a cause of his pain but symptoms continue.  Is taking omeprazole twice a day but only intermittently, and not consistently daily.  No dysphagia.  No weight loss, no nausea or vomiting.  States he ran out of the medication  Reports previous colonoscopies.  Last one was in 2011.  Care everywhere does show that a colonoscopy was done at that time.  No family history of colon cancer.  Patient had an upper endoscopy October 2020 done for follow-up of Barrett's esophagus, and that showed a 2 cm hiatal hernia, grade B esophagitis, findings suspicious for short segment Barrett's esophagus that were biopsied, esophagus, gastric erythema was reported.  Pathology was negativeFor H. pylori on gastric biopsy.  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  Review of previous records also show that patient has history of hepatitisC and as per last note, March 2017 by [facility], he was considered cured.  "Hepatitis C, GT 1a, incomplete treatment with PEG/RBV/TVR in past. Finished Harvoni about 6 months ago. Had no issues. Remarks he was diagnosed with Barrett's years ago, recent EGD (10/2015) has appearance of Barrett's but pathology showed no intestinal  metaplasia. .  Had SVR at 12 weeks post-treatment"  Hep C RNA was undetectable 2017  Past Medical History:  Diagnosis Date  . Anxiety   . Arthritis    RA   . Asthma   . Bipolar disorder ([place])   . BPH (benign prostatic hyperplasia)   . Bronchitis   . Cataract   . Chronic constipation   . Chronic pain   . COPD (chronic obstructive pulmonary disease) ([place])   . Depression   . Dry eye   . GERD (gastroesophageal reflux disease)   . Headache    migraines  . Hepatitis C    " had treatment " per pt  . Herpes   . History of pancreatitis   . History of seizure disorder   . Hypothyroidism   . Memory impairment   . Myoclonic jerking   . Neck injury   . Nocturia   . Pneumonia   . Rotator cuff tear    left  . Schizophrenia ([place])   . SOB (shortness of breath)   . Trigger finger   . Urinary frequency   . Wears glasses     Past Surgical History:  Procedure Laterality Date  . COLONOSCOPY  2011  . ROTATOR CUFF REPAIR Left   . ROTATOR CUFF REPAIR Right   . SHOULDER ARTHROSCOPY Left 07/12/2018   Procedure: LEFT SHOULDER ARTHROSCOPIC ROTATOR CUFF TEAR VS. DEBRIDEMENT, SUBACROMIAL DECOMPRESSION;  Surgeon: Tania Ade, MD;  Location: [place];  Service: Orthopedics;  Laterality: Left;  . TRIGGER FINGER RELEASE  Prior to Admission medications   Medication Sig Start Date End Date Taking? Authorizing Provider  Cholecalciferol (VITAMIN D PO) Take 1 capsule by mouth daily.   Yes [provider]  Diphenhyd-Hydrocort-Nystatin (FIRST-DUKES MOUTHWASH MT) 1 Dose by Mouth Rinse route daily as needed (mouth sores).  09/28/15  Yes [provider]  levothyroxine (SYNTHROID, LEVOTHROID) 25 MCG tablet Take 25 mcg by mouth daily. 09/16/15  Yes [provider]  Milk Thistle 1000 MG CAPS Take 1,000 mg by mouth 3 (three) times daily.   Yes [provider]  Misc Natural Products (TURMERIC CURCUMIN) CAPS Take 1 capsule by mouth 2 (two) times daily.   Yes  [provider]  Olopatadine HCl (PAZEO) 0.7 % SOLN Place 1 drop into both eyes daily as needed (dry eye).   Yes [provider]  Omeprazole (RA OMEPRAZOLE) 20 MG TBEC Take 1 tablet (20 mg total) by mouth daily. 05/12/20  Yes Virgel Manifold, MD  valACYclovir (VALTREX) 500 MG tablet Take 500 mg by mouth daily as needed (fever blisters).  01/17/11  Yes [provider]  b complex vitamins tablet Take 1 tablet by mouth 2 (two) times daily. Patient not taking: Reported on 05/12/2020    [provider]  Na Sulfate-K Sulfate-Mg Sulf 17.5-3.13-1.6 GM/177ML SOLN At 5 PM the day before procedure take 1 bottle and 5 hours before procedure take 1 bottle. 05/12/20   Virgel Manifold, MD  POTASSIUM PO Take 1 tablet by mouth daily. Patient not taking: Reported on 05/12/2020    [provider]    Family History  Problem Relation Age of Onset  . Breast cancer Mother   . Kidney disease Mother   . Bladder Cancer Mother   . Heart disease Unknown   . Kidney disease Cousin   . Prostate cancer Father   . Stroke Father   . Kidney cancer Neg Hx      Social History   Tobacco Use  . Smoking status: Former Smoker    Types: Cigarettes  . Smokeless tobacco: Never Used  . Tobacco comment: quit 20 years  Vaping Use  . Vaping Use: Never used  Substance Use Topics  . Alcohol use: No    Alcohol/week: 0.0 standard drinks  . Drug use: No    Allergies as of 05/12/2020 - Review Complete 05/12/2020  Allergen Reaction Noted  . Vesicare [solifenacin] Other (See Comments) 10/06/2015    Review of Systems:    All systems reviewed and negative except where noted in HPI.   Physical Exam:  BP 114/66   Pulse 65   Temp 97.8 F (36.6 C) (Oral)   Ht 6' (1.829 m)   Wt 182 lb 3.2 oz (82.6 kg)   BMI 24.71 kg/m  No LMP for male patient. Psych:  Alert and cooperative. Normal mood and affect. General:   Alert,  Well-developed, well-nourished, pleasant and cooperative  in NAD Head:  Normocephalic and atraumatic. Eyes:  Sclera clear, no icterus.   Conjunctiva pink. Ears:  Normal auditory acuity. Nose:  No deformity, discharge, or lesions. Mouth:  No deformity or lesions,oropharynx pink & moist. Neck:  Supple; no masses or thyromegaly. Abdomen:  Normal bowel sounds.  No bruits.  Soft, non-tender and non-distended without masses, hepatosplenomegaly or hernias noted.  No guarding or rebound tenderness.    Msk:  Symmetrical without gross deformities. Good, equal movement & strength bilaterally. Pulses:  Normal pulses noted. Extremities:  No clubbing or edema.  No cyanosis. Neurologic:  Alert  and oriented x3;  grossly normal neurologically. Skin:  Intact without significant lesions or rashes. No jaundice. Lymph Nodes:  No significant cervical adenopathy. Psych:  Alert and cooperative. Normal mood and affect.   Labs: CBC    Component Value Date/Time   WBC 3.7 (L) 07/12/2018 0939   RBC 4.35 07/12/2018 0939   HGB 13.6 07/12/2018 0939   HGB 12.5 (L) 01/27/2013 0443   HCT 40.5 07/12/2018 0939   HCT 36.8 (L) 01/27/2013 0443   PLT 229 07/12/2018 0939   PLT 194 01/27/2013 0443   MCV 93.1 07/12/2018 0939   MCV 94 01/27/2013 0443   MCH 31.3 07/12/2018 0939   MCHC 33.6 07/12/2018 0939   RDW 12.1 07/12/2018 0939   RDW 13.2 01/27/2013 0443   LYMPHSABS 0.4 (L) 05/17/2017 0745   LYMPHSABS 2.6 01/27/2013 0443   MONOABS 0.6 05/17/2017 0745   MONOABS 0.4 01/27/2013 0443   EOSABS 0.0 05/17/2017 0745   EOSABS 0.1 01/27/2013 0443   BASOSABS 0.0 05/17/2017 0745   BASOSABS 0.0 01/27/2013 0443   CMP     Component Value Date/Time   NA 143 07/12/2018 0939   NA 138 03/26/2014 1042   K 3.9 07/12/2018 0939   K 3.7 03/26/2014 1042   CL 108 07/12/2018 0939   CL 107 03/26/2014 1042   CO2 25 07/12/2018 0939   CO2 25 03/26/2014 1042   GLUCOSE 86 07/12/2018 0939   GLUCOSE 93 03/26/2014 1042   BUN 11 07/12/2018 0939   BUN 16 03/26/2014 1042   CREATININE 0.94  07/12/2018 0939   CREATININE 1.10 03/26/2014 1042   CALCIUM 9.5 07/12/2018 0939   CALCIUM 9.3 03/26/2014 1042   PROT 7.1 04/27/2018 1154   PROT 6.8 01/25/2013 1517   ALBUMIN 4.8 04/27/2018 1154   ALBUMIN 3.6 01/25/2013 1517   AST 18 04/27/2018 1154   AST 30 01/25/2013 1517   ALT 13 04/27/2018 1154   ALT 38 01/25/2013 1517   ALKPHOS 46 04/27/2018 1154   ALKPHOS 58 01/25/2013 1517   BILITOT 0.4 04/27/2018 1154   BILITOT 0.4 01/25/2013 1517   GFRNONAA >60 07/12/2018 0939   GFRNONAA >60 03/26/2014 1042   GFRAA >60 07/12/2018 0939   GFRAA >60 03/26/2014 1042    Imaging Studies: MR CERVICAL SPINE WO CONTRAST  Result Date: 04/21/2020 CLINICAL DATA:  Eval neurological compression. EXAM: MRI CERVICAL SPINE WITHOUT CONTRAST TECHNIQUE: Multiplanar, multisequence MR imaging of the cervical spine was performed. No intravenous contrast was administered. COMPARISON:  12/28/2017 MRI cervical spine. FINDINGS: Alignment: Straightening of cervical lordosis. Minimal grade 1 C3-4 and C7-T1 anterolisthesis. Vertebrae: Normal bone marrow signal intensity. No focal osseous lesion. Cord: Normal signal and morphology. Posterior Fossa, vertebral arteries: Negative. Disc levels: Multilevel osteophytosis and desiccation. Mild C3-4 and moderate C4-7 disc space loss. C2-3: Small central protrusion and bilateral facet degenerative spurring. No significant spinal canal or neural foraminal narrowing. C3-4: Disc osteophyte complex with superimposed right subarticular protrusion and right predominant uncovertebral/facet hypertrophy. Severe right neural foraminal narrowing is unchanged. No significant spinal canal or left neural foraminal narrowing. C4-5: Disc osteophyte complex with superimposed right paracentral protrusion abutting the ventral cord, uncovertebral and bilateral facet hypertrophy. Mild spinal canal and moderate bilateral neural foraminal narrowing, grossly unchanged. C5-6: Disc osteophyte complex with small  superimposed right paracentral protrusion abutting the ventral cord with bilateral uncovertebral and facet hypertrophy. Moderate spinal canal and bilateral neural foraminal narrowing, grossly unchanged. C6-7: Disc osteophyte complex with superimposed central protrusion abutting the ventral cord, uncovertebral and bilateral facet  hypertrophy. Mild spinal canal and bilateral neural foraminal narrowing. C7-T1: Tiny central protrusion with bilateral facet degenerative spurring. No significant spinal canal or neural foraminal narrowing. Paraspinal tissues: Within normal limits. IMPRESSION: Multilevel spondylosis, overall unchanged since 2019. Moderate spinal canal and bilateral neural foraminal narrowing at the C5-6 level, unchanged. Severe right C3-4 and moderate bilateral C4-5 neural foraminal narrowing, unchanged. Electronically Signed   By: Primitivo Gauze M.D.   On: 04/21/2020 08:41    Assessment and Plan:   Nikolaus Pienta is a 64 y.o. y/o male has been referred for abdominal pain  Given that he has not been taking his PPI consistently we discussed conservative management first and then if symptoms continue proceeding with endoscopy  However, patient has had ongoing pain in his chest and proceeding with upper endoscopy as he does not want to wait any longer given his ongoing symptoms  Esophagitis was noted on previous procedure and may be the underlying cause of his pain  Refill Protonix given his history of Barrett's esophagus  (Risks of PPI use were discussed with patient including bone loss, C. Diff diarrhea, pneumonia, infections, CKD, electrolyte abnormalities.  Pt. Verbalizes understanding and chooses to continue the medication.)  Patient also due for screening colonoscopy  I have discussed alternative options, risks & benefits,  which include, but are not limited to, bleeding, infection, perforation,respiratory complication & drug reaction.  The patient agrees with this plan &  written consent will be obtained.       Dr Hayden Hall  Speech recognition software was used to dictate the above note.

## 2020-05-19 ENCOUNTER — Other Ambulatory Visit: Payer: Self-pay

## 2020-05-19 ENCOUNTER — Other Ambulatory Visit
Admission: RE | Admit: 2020-05-19 | Discharge: 2020-05-19 | Disposition: A | Discharge status: Home / Self Care | Payer: Medicare Other | Source: Ambulatory Visit | Attending: Gastroenterology | Admitting: Gastroenterology

## 2020-05-19 DIAGNOSIS — Z01812 Encounter for preprocedural laboratory examination: Secondary | ICD-10-CM | POA: Insufficient documentation

## 2020-05-19 DIAGNOSIS — Z20822 Contact with and (suspected) exposure to covid-19: Secondary | ICD-10-CM | POA: Diagnosis not present

## 2020-05-19 LAB — SARS CORONAVIRUS 2 (TAT 6-24 HRS): SARS Coronavirus 2: NEGATIVE

## 2020-05-21 ENCOUNTER — Ambulatory Visit: Payer: Medicare Other | Admitting: Registered Nurse

## 2020-05-21 ENCOUNTER — Other Ambulatory Visit: Payer: Self-pay

## 2020-05-21 ENCOUNTER — Encounter
Admission: RE | Disposition: A | Discharge status: Home / Self Care | Payer: Self-pay | Source: Home / Self Care | Attending: Gastroenterology

## 2020-05-21 ENCOUNTER — Encounter: Payer: Self-pay | Admitting: Gastroenterology

## 2020-05-21 ENCOUNTER — Ambulatory Visit
Admission: RE | Admit: 2020-05-21 | Discharge: 2020-05-21 | Disposition: A | Discharge status: Home / Self Care | Payer: Medicare Other | Attending: Gastroenterology | Admitting: Gastroenterology

## 2020-05-21 DIAGNOSIS — K2100 Gastro-esophageal reflux disease with esophagitis, without bleeding: Secondary | ICD-10-CM | POA: Diagnosis not present

## 2020-05-21 DIAGNOSIS — R1013 Epigastric pain: Secondary | ICD-10-CM | POA: Diagnosis present

## 2020-05-21 DIAGNOSIS — Z1211 Encounter for screening for malignant neoplasm of colon: Secondary | ICD-10-CM

## 2020-05-21 DIAGNOSIS — E039 Hypothyroidism, unspecified: Secondary | ICD-10-CM | POA: Diagnosis not present

## 2020-05-21 DIAGNOSIS — R1084 Generalized abdominal pain: Secondary | ICD-10-CM | POA: Diagnosis not present

## 2020-05-21 DIAGNOSIS — K635 Polyp of colon: Secondary | ICD-10-CM | POA: Diagnosis not present

## 2020-05-21 DIAGNOSIS — K6289 Other specified diseases of anus and rectum: Secondary | ICD-10-CM | POA: Diagnosis not present

## 2020-05-21 DIAGNOSIS — J449 Chronic obstructive pulmonary disease, unspecified: Secondary | ICD-10-CM | POA: Diagnosis not present

## 2020-05-21 DIAGNOSIS — K209 Esophagitis, unspecified without bleeding: Secondary | ICD-10-CM

## 2020-05-21 DIAGNOSIS — Z87891 Personal history of nicotine dependence: Secondary | ICD-10-CM | POA: Diagnosis not present

## 2020-05-21 DIAGNOSIS — D125 Benign neoplasm of sigmoid colon: Secondary | ICD-10-CM | POA: Insufficient documentation

## 2020-05-21 DIAGNOSIS — Z79890 Hormone replacement therapy (postmenopausal): Secondary | ICD-10-CM | POA: Diagnosis not present

## 2020-05-21 HISTORY — PX: COLONOSCOPY WITH PROPOFOL: SHX5780

## 2020-05-21 HISTORY — PX: ESOPHAGOGASTRODUODENOSCOPY (EGD) WITH PROPOFOL: SHX5813

## 2020-05-21 SURGERY — COLONOSCOPY WITH PROPOFOL
Anesthesia: General

## 2020-05-21 MED ORDER — PROPOFOL 500 MG/50ML IV EMUL
INTRAVENOUS | Status: DC | PRN
  Administered 2020-05-21: 150 ug/kg/min via INTRAVENOUS

## 2020-05-21 MED ORDER — PROPOFOL 10 MG/ML IV BOLUS
INTRAVENOUS | Status: DC | PRN
  Administered 2020-05-21: 80 mg via INTRAVENOUS

## 2020-05-21 MED ORDER — GLYCOPYRROLATE 0.2 MG/ML IJ SOLN
INTRAMUSCULAR | Status: DC | PRN
  Administered 2020-05-21: .2 mg via INTRAVENOUS

## 2020-05-21 MED ORDER — SODIUM CHLORIDE 0.9 % IV SOLN: INTRAVENOUS | Status: DC

## 2020-05-21 MED ORDER — LIDOCAINE HCL (CARDIAC) PF 100 MG/5ML IV SOSY
PREFILLED_SYRINGE | INTRAVENOUS | Status: DC | PRN
  Administered 2020-05-21: 100 mg via INTRAVENOUS

## 2020-05-21 MED ORDER — DEXMEDETOMIDINE HCL 200 MCG/2ML IV SOLN
INTRAVENOUS | Status: DC | PRN
  Administered 2020-05-21: 20 ug via INTRAVENOUS

## 2020-05-21 NOTE — Transfer of Care (Signed)
Immediate Anesthesia Transfer of Care Note  Patient: Hayden Hall  Procedure(s) Performed: COLONOSCOPY WITH PROPOFOL (N/A ) ESOPHAGOGASTRODUODENOSCOPY (EGD) WITH PROPOFOL (N/A )  Patient Location: PACU and Endoscopy Unit  Anesthesia Type:General  Level of Consciousness: drowsy  Airway & Oxygen Therapy: Patient Spontanous Breathing  Post-op Assessment: Report given to RN and Post -op Vital signs reviewed and stable  Post vital signs: Reviewed and stable  Last Vitals:  Vitals Value Taken Time  BP 89/65 05/21/20 1049  Temp 36.1 C 05/21/20 1049  Pulse 70 05/21/20 1050  Resp 25 05/21/20 1050  SpO2 95 % 05/21/20 1050  Vitals shown include unvalidated device data.  Last Pain:  Vitals:   05/21/20 1049  TempSrc: Temporal  PainSc: Asleep         Complications: No complications documented.

## 2020-05-21 NOTE — H&P (Signed)
Vonda Antigua, MD [address], [place], [place], [place], [address] [address], [place], [place], [place], [address] Phone: [phone]  Fax: [phone]  Primary Care Physician:  Theotis Burrow, MD   Pre-Procedure History & Physical: HPI:  Shanta Dorvil is a 64 y.o. male is here for a colonoscopy and EGD.   Past Medical History:  Diagnosis Date  . Anxiety   . Arthritis    RA   . Asthma   . Bipolar disorder ([place])   . BPH (benign prostatic hyperplasia)   . Bronchitis   . Cataract   . Chronic constipation   . Chronic pain   . COPD (chronic obstructive pulmonary disease) ([place])   . Depression   . Dry eye   . GERD (gastroesophageal reflux disease)   . Headache    migraines  . Hepatitis C    " had treatment " per pt  . Herpes   . History of pancreatitis   . History of seizure disorder   . Hypothyroidism   . Memory impairment   . Myoclonic jerking   . Neck injury   . Nocturia   . Pneumonia   . Rotator cuff tear    left  . Schizophrenia ([place])   . SOB (shortness of breath)   . Trigger finger   . Urinary frequency   . Wears glasses     Past Surgical History:  Procedure Laterality Date  . COLONOSCOPY  2011  . ROTATOR CUFF REPAIR Left   . ROTATOR CUFF REPAIR Right   . SHOULDER ARTHROSCOPY Left 07/12/2018   Procedure: LEFT SHOULDER ARTHROSCOPIC ROTATOR CUFF TEAR VS. DEBRIDEMENT, SUBACROMIAL DECOMPRESSION;  Surgeon: Tania Ade, MD;  Location: [place];  Service: Orthopedics;  Laterality: Left;  . TRIGGER FINGER RELEASE      Prior to Admission medications   Medication Sig Start Date End Date Taking? Authorizing Provider  levothyroxine (SYNTHROID, LEVOTHROID) 25 MCG tablet Take 25 mcg by mouth daily. 09/16/15  Yes [provider]  Omeprazole (RA OMEPRAZOLE) 20 MG TBEC Take 1 tablet (20 mg total) by mouth daily. 05/12/20  Yes Quentina Fronek, Margretta Sidle B, MD  POTASSIUM PO Take 1 tablet by mouth daily.    Yes [provider]    valACYclovir (VALTREX) 500 MG tablet Take 500 mg by mouth daily as needed (fever blisters).  01/17/11  Yes [provider]  b complex vitamins tablet Take 1 tablet by mouth 2 (two) times daily. Patient not taking: Reported on 05/12/2020    [provider]  Cholecalciferol (VITAMIN D PO) Take 1 capsule by mouth daily.    [provider]  Diphenhyd-Hydrocort-Nystatin (FIRST-DUKES MOUTHWASH MT) 1 Dose by Mouth Rinse route daily as needed (mouth sores).  09/28/15   [provider]  Milk Thistle 1000 MG CAPS Take 1,000 mg by mouth 3 (three) times daily.    [provider]  Misc Natural Products (TURMERIC CURCUMIN) CAPS Take 1 capsule by mouth 2 (two) times daily.    [provider]  Na Sulfate-K Sulfate-Mg Sulf 17.5-3.13-1.6 GM/177ML SOLN At 5 PM the day before procedure take 1 bottle and 5 hours before procedure take 1 bottle. 05/12/20   Virgel Manifold, MD  Olopatadine HCl (PAZEO) 0.7 % SOLN Place 1 drop into both eyes daily as needed (dry eye).    [provider]    Allergies as of 05/12/2020 - Review Complete 05/12/2020  Allergen Reaction Noted  . Vesicare [solifenacin] Other (See Comments) 10/06/2015  Family History  Problem Relation Age of Onset  . Breast cancer Mother   . Kidney disease Mother   . Bladder Cancer Mother   . Heart disease Other   . Kidney disease Cousin   . Prostate cancer Father   . Stroke Father   . Kidney cancer Neg Hx     Social History   Socioeconomic History  . Marital status: Single    Spouse name: Not on file  . Number of children: Not on file  . Years of education: Not on file  . Highest education level: Not on file  Occupational History  . Not on file  Tobacco Use  . Smoking status: Former Smoker    Types: Cigarettes  . Smokeless tobacco: Never Used  . Tobacco comment: quit 20 years  Vaping Use  . Vaping Use: Never used  Substance and Sexual Activity  . Alcohol use: No     Alcohol/week: 0.0 standard drinks  . Drug use: No  . Sexual activity: Not on file  Other Topics Concern  . Not on file  Social History Narrative  . Not on file   Social Determinants of Health   Financial Resource Strain:   . Difficulty of Paying Living Expenses: Not on file  Food Insecurity:   . Worried About Charity fundraiser in the Last Year: Not on file  . Ran Out of Food in the Last Year: Not on file  Transportation Needs:   . Lack of Transportation (Medical): Not on file  . Lack of Transportation (Non-Medical): Not on file  Physical Activity:   . Days of Exercise per Week: Not on file  . Minutes of Exercise per Session: Not on file  Stress:   . Feeling of Stress : Not on file  Social Connections:   . Frequency of Communication with Friends and Family: Not on file  . Frequency of Social Gatherings with Friends and Family: Not on file  . Attends Religious Services: Not on file  . Active Member of Clubs or Organizations: Not on file  . Attends Archivist Meetings: Not on file  . Marital Status: Not on file  Intimate Partner Violence:   . Fear of Current or Ex-Partner: Not on file  . Emotionally Abused: Not on file  . Physically Abused: Not on file  . Sexually Abused: Not on file    Review of Systems: See HPI, otherwise negative ROS  Physical Exam: There were no vitals taken for this visit. General:   Alert,  pleasant and cooperative in NAD Head:  Normocephalic and atraumatic. Neck:  Supple; no masses or thyromegaly. Lungs:  Clear throughout to auscultation, normal respiratory effort.    Heart:  +S1, +S2, Regular rate and rhythm, No edema. Abdomen:  Soft, nontender and nondistended. Normal bowel sounds, without guarding, and without rebound.   Neurologic:  Alert and  oriented x4;  grossly normal neurologically.  Impression/Plan: Anas Reister is here for a colonoscopy to be performed for average risk screening and EGD for Acid Reflux.  Risks,  benefits, limitations, and alternatives regarding the procedures have been reviewed with the patient.  Questions have been answered.  All parties agreeable.   Virgel Manifold, MD  05/21/2020, 9:11 AM

## 2020-05-21 NOTE — Anesthesia Preprocedure Evaluation (Addendum)
Anesthesia Evaluation  Patient identified by MRN, date of birth, ID band Patient awake    Reviewed: Allergy & Precautions, H&P , NPO status , Patient's Chart, lab work & pertinent test results  Airway Mallampati: III  TM Distance: >3 FB Neck ROM: Full    Dental no notable dental hx. (+) Dental Advisory Given, Chipped   Pulmonary shortness of breath and with exertion, asthma , COPD, former smoker,    Pulmonary exam normal breath sounds clear to auscultation       Cardiovascular negative cardio ROS   Rhythm:Regular Rate:Normal     Neuro/Psych  Headaches, PSYCHIATRIC DISORDERS Anxiety Depression Bipolar Disorder Schizophrenia  Neuromuscular disease    GI/Hepatic GERD  Medicated and Controlled,(+) Hepatitis -, C  Endo/Other  Hypothyroidism   Renal/GU negative Renal ROS  negative genitourinary   Musculoskeletal  (+) Arthritis , Osteoarthritis,    Abdominal   Peds negative pediatric ROS (+)  Hematology negative hematology ROS (+)   Anesthesia Other Findings   Reproductive/Obstetrics negative OB ROS                            Anesthesia Physical  Anesthesia Plan  ASA: III  Anesthesia Plan: General   Post-op Pain Management:  Regional for Post-op pain   Induction: Intravenous  PONV Risk Score and Plan:   Airway Management Planned: Nasal Cannula  Additional Equipment:   Intra-op Plan:   Post-operative Plan:   Informed Consent: I have reviewed the patients History and Physical, chart, labs and discussed the procedure including the risks, benefits and alternatives for the proposed anesthesia with the patient or authorized representative who has indicated his/her understanding and acceptance.     Dental advisory given  Plan Discussed with: CRNA  Anesthesia Plan Comments:         Anesthesia Quick Evaluation

## 2020-05-21 NOTE — Op Note (Signed)
[facility] Gastroenterology Patient Name: Hayden Hall Procedure Date: 05/21/2020 10:00 AM MRN: [id number] Account #: [id number] Date of Birth: [birth year] Admit Type: Outpatient Age: 64 Room: [facility] ENDO ROOM 1 Gender: Male Note Status: Finalized Procedure:             Upper GI endoscopy Indications:           Epigastric abdominal pain, Heartburn Providers:             Demarius Archila B. Bonna Gains MD, MD Referring MD:          Theotis Burrow (Referring MD) Medicines:             Monitored Anesthesia Care Complications:         No immediate complications. Procedure:             Pre-Anesthesia Assessment:                        - Prior to the procedure, a History and Physical was                         performed, and patient medications, allergies and                         sensitivities were reviewed. The patient's tolerance                         of previous anesthesia was reviewed.                        - The risks and benefits of the procedure and the                         sedation options and risks were discussed with the                         patient. All questions were answered and informed                         consent was obtained.                        - Patient identification and proposed procedure were                         verified prior to the procedure by the physician, the                         nurse, the anesthesiologist, the anesthetist and the                         technician. The procedure was verified in the                         procedure room.                        - ASA Grade Assessment: II - A patient with mild                         systemic  disease.                        After obtaining informed consent, the endoscope was                         passed under direct vision. Throughout the procedure,                         the patient's blood pressure, pulse, and oxygen                         saturations were  monitored continuously. The Endoscope                         was introduced through the mouth, and advanced to the                         second part of duodenum. The upper GI endoscopy was                         accomplished with ease. The patient tolerated the                         procedure well. Findings:      LA Grade A (one or more mucosal breaks less than 5 mm, not extending       between tops of 2 mucosal folds) esophagitis with no bleeding was found       at the gastroesophageal junction.      The exam of the esophagus was otherwise normal.      The entire examined stomach was normal. Biopsies were obtained in the       gastric body, at the incisura and in the gastric antrum with cold       forceps for histology. Biopsies were taken with a cold forceps for       Helicobacter pylori testing.      The duodenal bulb, second portion of the duodenum and examined duodenum       were normal. Impression:            - LA Grade A reflux esophagitis with no bleeding.                        - Normal stomach. Biopsied.                        - Normal duodenal bulb, second portion of the duodenum                         and examined duodenum.                        - Biopsies were obtained in the gastric body, at the                         incisura and in the gastric antrum. Recommendation:        - Await pathology results.                        - Discharge patient  to home (with escort).                        - Advance diet as tolerated.                        - Continue present medications.                        - Patient has a contact number available for                         emergencies. The signs and symptoms of potential                         delayed complications were discussed with the patient.                         Return to normal activities tomorrow. Written                         discharge instructions were provided to the patient.                        -  Discharge patient to home (with escort).                        - The findings and recommendations were discussed with                         the patient.                        - The findings and recommendations were discussed with                         the patient's family.                        - Follow an antireflux regimen.                        - Take prescribed proton pump inhibitor or H2 blocker                         (antacid) medications 30 - 60 minutes before meals. Procedure Code(s):     --- Professional ---                        [phone], Esophagogastroduodenoscopy, flexible,                         transoral; with biopsy, single or multiple Diagnosis Code(s):     --- Professional ---                        K21.00                        R10.13, Epigastric pain                        R12, Heartburn  CPT copyright 2019 American Medical Association. All rights reserved. The codes documented in this report are preliminary and upon coder review may  be revised to meet current compliance requirements.  Vonda Antigua, MD Margretta Sidle B. Bonna Gains MD, MD 05/21/2020 10:20:38 AM This report has been signed electronically. Number of Addenda: 0 Note Initiated On: 05/21/2020 10:00 AM Estimated Blood Loss:  Estimated blood loss: none.      [facility]

## 2020-05-21 NOTE — Op Note (Signed)
[facility] Gastroenterology Patient Name: Hayden Hall Procedure Date: 05/21/2020 9:59 AM MRN: [id number] Account #: [id number] Date of Birth: 1955-12-08 Admit Type: Outpatient Age: 64 Room: [facility] ENDO ROOM 1 Gender: Male Note Status: Finalized Procedure:             Colonoscopy Indications:           Screening for colorectal malignant neoplasm Providers:             Jacari Kirsten B. Bonna Gains MD, MD Referring MD:          Theotis Burrow (Referring MD) Medicines:             Monitored Anesthesia Care Complications:         No immediate complications. Procedure:             Pre-Anesthesia Assessment:                        - ASA Grade Assessment: II - A patient with mild                         systemic disease.                        - Prior to the procedure, a History and Physical was                         performed, and patient medications, allergies and                         sensitivities were reviewed. The patient's tolerance                         of previous anesthesia was reviewed.                        - The risks and benefits of the procedure and the                         sedation options and risks were discussed with the                         patient. All questions were answered and informed                         consent was obtained.                        - Patient identification and proposed procedure were                         verified prior to the procedure by the physician, the                         nurse, the anesthesiologist, the anesthetist and the                         technician. The procedure was verified in the                         procedure room.  After obtaining informed consent, the colonoscope was                         passed under direct vision. Throughout the procedure,                         the patient's blood pressure, pulse, and oxygen                         saturations were  monitored continuously. The                         Colonoscope was introduced through the anus and                         advanced to the the cecum, identified by appendiceal                         orifice and ileocecal valve. The colonoscopy was                         performed with ease. The patient tolerated the                         procedure well. The quality of the bowel preparation                         was good. Findings:      The perianal and digital rectal examinations were normal.      A 4 mm polyp was found in the ileocecal valve. The polyp was sessile.       The polyp was removed with a cold biopsy forceps. Resection and       retrieval were complete.      A 4 mm polyp was found in the sigmoid colon. The polyp was sessile. The       polyp was removed with a cold biopsy forceps. Resection and retrieval       were complete.      A 8 mm polyp was found in the sigmoid colon. The polyp was flat. The       polyp was removed with a cold snare. Resection and retrieval were       complete.      Multiple diverticula were found in the sigmoid colon.      The exam was otherwise without abnormality.      The rectum, sigmoid colon, descending colon, transverse colon, ascending       colon and cecum appeared normal.      Anal papilla(e) were hypertrophied.      No additional abnormalities were found on retroflexion. Impression:            - One 4 mm polyp at the ileocecal valve, removed with                         a cold biopsy forceps. Resected and retrieved.                        - One 4 mm polyp in the sigmoid colon, removed with a  cold biopsy forceps. Resected and retrieved.                        - One 8 mm polyp in the sigmoid colon, removed with a                         cold snare. Resected and retrieved.                        - Diverticulosis in the sigmoid colon.                        - The examination was otherwise normal.                         - The rectum, sigmoid colon, descending colon,                         transverse colon, ascending colon and cecum are normal.                        - Anal papilla(e) were hypertrophied. Recommendation:        - Discharge patient to home (with escort).                        - Advance diet as tolerated.                        - Continue present medications.                        - Await pathology results.                        - Repeat colonoscopy date to be determined after                         pending pathology results are reviewed.                        - The findings and recommendations were discussed with                         the patient.                        - The findings and recommendations were discussed with                         the patient's family.                        - Return to primary care physician as previously                         scheduled.                        - High fiber diet. Procedure Code(s):     --- Professional ---  45385, Colonoscopy, flexible; with removal of                         tumor(s), polyp(s), or other lesion(s) by snare                         technique                        45380, 45, Colonoscopy, flexible; with biopsy, single                         or multiple Diagnosis Code(s):     --- Professional ---                        Z12.11, Encounter for screening for malignant neoplasm                         of colon                        K63.5, Polyp of colon CPT copyright 2019 American Medical Association. All rights reserved. The codes documented in this report are preliminary and upon coder review may  be revised to meet current compliance requirements.  Vonda Antigua, MD Margretta Sidle B. Bonna Gains MD, MD 05/21/2020 10:53:40 AM This report has been signed electronically. Number of Addenda: 0 Note Initiated On: 05/21/2020 9:59 AM Scope Withdrawal Time: 0 hours 16 minutes 32 seconds  Total  Procedure Duration: 0 hours 22 minutes 17 seconds  Estimated Blood Loss:  Estimated blood loss: none.      [facility]

## 2020-05-22 ENCOUNTER — Encounter: Payer: Self-pay | Admitting: Gastroenterology

## 2020-05-22 LAB — SURGICAL PATHOLOGY

## 2020-05-22 NOTE — Anesthesia Postprocedure Evaluation (Signed)
Anesthesia Post Note  Patient: Hayden Hall  Procedure(s) Performed: COLONOSCOPY WITH PROPOFOL (N/A ) ESOPHAGOGASTRODUODENOSCOPY (EGD) WITH PROPOFOL (N/A )  Patient location during evaluation: PACU Anesthesia Type: General Level of consciousness: awake and alert and oriented Pain management: pain level controlled Vital Signs Assessment: post-procedure vital signs reviewed and stable Respiratory status: spontaneous breathing Cardiovascular status: blood pressure returned to baseline Anesthetic complications: no   No complications documented.   Last Vitals:  Vitals:   05/21/20 1049 05/21/20 1109  BP: (!) 89/65 102/73  Pulse: 65   Resp: 15   Temp: (!) 36.1 C   SpO2: 96% 99%    Last Pain:  Vitals:   05/21/20 1119  TempSrc:   PainSc: 0-No pain                 Hayes Rehfeldt

## 2020-05-25 ENCOUNTER — Encounter: Payer: Self-pay | Admitting: Gastroenterology

## 2020-06-06 ENCOUNTER — Other Ambulatory Visit: Payer: Self-pay | Admitting: Gastroenterology

## 2020-06-28 ENCOUNTER — Other Ambulatory Visit: Payer: Self-pay | Admitting: Gastroenterology

## 2020-06-30 ENCOUNTER — Encounter: Payer: Self-pay | Admitting: *Deleted

## 2020-06-30 NOTE — Progress Notes (Signed)
[facility]    Provider:  Dr Jaynee Eagles Requesting Provider: Phylliss Bob, MD Primary Care Provider:  Theotis Burrow, MD  CC:  Neck pain and head pain  HPI:  Hayden Hall is a 64 y.o. male here as requested by Phylliss Bob, MD for chronic headache. PMHx schizophrenia, rotator cuff tear, neck injury, myoclonic jerking, memory impairment, hypothyroidism, history of seizure disorder, headache, depression, COPD, chronic pain, bipolar disorder, arthritis, asthma, anxiety.  I reviewed Dr. Laurena Bering notes: Patient was sent here for chronic headaches.  He is managed by Dr. Laurena Bering team for complaints of multiple things related to pain in his neck and back and arms and legs, he has rated his pain 9 out of 10 in the past, patient reported tingling in his bilateral hands radiating from his neck, neck and right arm symptoms are his primary complaints for Dr. Lynann Bologna, had symptoms for many years, however he is here actually for headache.  He has been treated by Dr. Lynann Bologna for cervical degenerative disease and has had images of the cervical spine, occipital nerve blocks, epidural injections, other interventions and physical therapy.  Patient reports he is here for head pain. His head pain has been in the back and neck and around his skull radiating from the back of the neck. It all comes from the neck. It used to be very prevalent but lately he has been undergoing massage therapy and she does not "take no mercy" and it is better. No light or sound sensitivity, no nausea or vomiting. No vision changes. No other migrainous symptoms. He says he has only had "shots" not ablation. No pulsating or pounding or throbbing. Not positional in nature, not exertional, no jaw pain, ongoing for 40-50 years and stable or improved.    Reviewed notes, labs and imaging from outside physicians, which showed:  Reviewed notes from [facility] going back through 2009, [place] of the brain in 2009  "normal"  Review of Systems: Patient complains of symptoms per HPI as well as the following symptoms fatigue. Pertinent negatives and positives per HPI. All others negative.   Social History   Socioeconomic History  . Marital status: Single    Spouse name: Not on file  . Number of children: Not on file  . Years of education: Not on file  . Highest education level: Not on file  Occupational History  . Not on file  Tobacco Use  . Smoking status: Former Smoker    Types: Cigarettes  . Smokeless tobacco: Never Used  . Tobacco comment: quit 20 years  Vaping Use  . Vaping Use: Never used  Substance and Sexual Activity  . Alcohol use: Never    Alcohol/week: 0.0 standard drinks  . Drug use: Never  . Sexual activity: Not on file  Other Topics Concern  . Not on file  Social History Narrative   Lives at home alone   Caffeine: 3 cups daily   Social Determinants of Health   Financial Resource Strain:   . Difficulty of Paying Living Expenses: Not on file  Food Insecurity:   . Worried About Charity fundraiser in the Last Year: Not on file  . Ran Out of Food in the Last Year: Not on file  Transportation Needs:   . Lack of Transportation (Medical): Not on file  . Lack of Transportation (Non-Medical): Not on file  Physical Activity:   . Days of Exercise per Week: Not on file  . Minutes of Exercise per Session: Not on  file  Stress:   . Feeling of Stress : Not on file  Social Connections:   . Frequency of Communication with Friends and Family: Not on file  . Frequency of Social Gatherings with Friends and Family: Not on file  . Attends Religious Services: Not on file  . Active Member of Clubs or Organizations: Not on file  . Attends Archivist Meetings: Not on file  . Marital Status: Not on file  Intimate Partner Violence:   . Fear of Current or Ex-Partner: Not on file  . Emotionally Abused: Not on file  . Physically Abused: Not on file  . Sexually Abused: Not on  file    Family History  Problem Relation Age of Onset  . Breast cancer Mother   . Kidney disease Mother   . Bladder Cancer Mother   . Migraines Mother   . Heart Problems Mother   . Heart disease Other   . Kidney disease Cousin   . Prostate cancer Father   . Stroke Father   . Aneurysm Father   . Heart Problems Father   . Cancer Sister        metastatic breast cancer  . Migraines Sister   . Cancer Sister        metastatic brain cancer  . Migraines Sister   . Cancer Sister        believes this was metastatic breast cancer  . Stroke Paternal Grandfather   . Kidney cancer Neg Hx     Past Medical History:  Diagnosis Date  . Anxiety   . Arthritis    RA   . Asthma   . Bipolar disorder ([place])   . BPH (benign prostatic hyperplasia)   . Bronchitis   . Cataract   . Chronic constipation   . Chronic pain   . COPD (chronic obstructive pulmonary disease) ([place])   . Depression   . Dry eye   . GERD (gastroesophageal reflux disease)   . Headache    migraines  . Hepatitis C    " had treatment " per pt  . Herpes   . History of pancreatitis   . History of seizure disorder    "never was identified specifically but was very suspicious of it"  . Hypothyroidism   . Memory impairment   . Myoclonic jerking   . Neck injury   . Nocturia   . Plantar fasciitis    right   . Pneumonia   . Rotator cuff tear    left  . Schizophrenia ([place])   . SOB (shortness of breath)   . Trigger finger   . Urinary frequency   . Wears glasses     Patient Active Problem List   Diagnosis Date Noted  . 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  . Generalized abdominal pain   . Acute esophagitis   . Screen for colon cancer   . Polyp of colon   . 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 Osteoarthritis of spine with radiculopathy, cervical region 02/13/2018  . Cervicalgia 12/07/2017  . History of rotator cuff surgery 12/07/2017  . Cervical radiculopathy 12/07/2017  . DDD (degenerative  disc disease), cervical 12/07/2017  . Chronic pain syndrome 12/07/2017  . BPH with obstruction/lower urinary tract symptoms 10/21/2015  . COPD, mild ([place]) 04/07/2014  . Low back pain 01/09/2013    Past Surgical History:  Procedure Laterality Date  . COLONOSCOPY  2011  . COLONOSCOPY WITH PROPOFOL N/A 05/21/2020   Procedure: COLONOSCOPY WITH PROPOFOL;  Surgeon: Virgel Manifold, MD;  Location[facility] ENDOSCOPY;  Service: Endoscopy;  Laterality: N/A;  . ESOPHAGOGASTRODUODENOSCOPY (EGD) WITH PROPOFOL N/A 05/21/2020   Procedure: ESOPHAGOGASTRODUODENOSCOPY (EGD) WITH PROPOFOL;  Surgeon: Virgel Manifold, MD;  Location: [facility] ENDOSCOPY;  Service: Endoscopy;  Laterality: N/A;  . ROTATOR CUFF REPAIR Left   . ROTATOR CUFF REPAIR Right   . SHOULDER ARTHROSCOPY Left 07/12/2018   Procedure: LEFT SHOULDER ARTHROSCOPIC ROTATOR CUFF TEAR VS. DEBRIDEMENT, SUBACROMIAL DECOMPRESSION;  Surgeon: Tania Ade, MD;  Location: [place];  Service: Orthopedics;  Laterality: Left;  . TRIGGER FINGER RELEASE      Current Outpatient Medications  Medication Sig Dispense Refill  . Cholecalciferol (VITAMIN D PO) Take 1 capsule by mouth daily.    Marland Kitchen levothyroxine (SYNTHROID, LEVOTHROID) 25 MCG tablet Take 25 mcg by mouth daily.  4  . Milk Thistle 1000 MG CAPS Take 1,000 mg by mouth 3 (three) times daily.    . Misc Natural Products (TURMERIC CURCUMIN) CAPS Take 1 capsule by mouth 2 (two) times daily.    . valACYclovir (VALTREX) 500 MG tablet Take 500 mg by mouth daily as needed (fever blisters).      No current facility-administered medications for this visit.    Allergies as of 07/01/2020 - Review Complete 07/01/2020  Allergen Reaction Noted  . Vesicare [solifenacin] Other (See Comments) 10/06/2015    Vitals: BP 129/82 (BP Location: Right Arm, Patient Position: Sitting)   Pulse (!) 56   Ht 6' (1.829 m)   Wt 184 lb (83.5 kg)   BMI 24.95 kg/m  Last Weight:  Wt Readings from Last 1 Encounters:  07/01/20  184 lb (83.5 kg)   Last Height:   Ht Readings from Last 1 Encounters:  07/01/20 6' (1.829 m)     Physical exam: Exam: Gen: NAD, conversant                   CV: RRR, no MRG. No Carotid Bruits. No peripheral edema, warm, nontender Eyes: Conjunctivae clear without exudates or hemorrhage  Neuro: Detailed Neurologic Exam  Speech:    Speech is normal; fluent and spontaneous with normal comprehension.  Cognition:    The patient is oriented to person, place, and time;     recent and remote memory intact;     language fluent;     normal attention, concentration,     fund of knowledge Cranial Nerves:    The pupils are equal, round, and reactive to light. Attempted fundoscopy could not visualize. Visual fields are full to finger confrontation. Extraocular movements are intact. Trigeminal sensation is intact and the muscles of mastication are normal. The face is symmetric. The palate elevates in the midline. Hearing intact. Voice is normal. Shoulder shrug is normal. The tongue has normal motion without fasciculations.   Coordination:    No dysmetria or ataxia noted  Gait:    Normal native gait, no ataxia, good stance and stride  Motor Observation:    No asymmetry Tone:    Normal muscle tone.    Posture:    Posture is normal.     Strength: Poor effort and giveway states to pain, nothing focal appreciated.      Sensation: intact to LT     Reflex Exam:  DTR's:    Deep tendon reflexes in the upper and lower extremities are symmetrical bilaterally.   Toes:    The toes are equivocal bilaterally.   Clonus:    Clonus is absent.    Assessment/Plan: Really Lovely  patient. Patient is managed by orthopedics Dr. Lynann Bologna for his neck pain and occipital neuralgia and chronic cervical degenerative disease; he has had imaging of the cervical spine, occipital nerve blocks, injections, and other interventions. I do think that the pain in the back of the head radiating to the temple area is  coming from his cervical disease as cervico-occipital neuralgia and he should follow back up with Dr. Lynann Bologna for pain management and evaluation for other procedures such as Radio frequency Ablation. Imaging of the brain is not indicated at this time as he is improved and symptoms ongoing for 40-50 years without new quality/frequency/severity symptoms and MRI brain [facility] at [facility] in the past, no new symptoms since then, No light or sound sensitivity, no nausea or vomiting. No vision changes. No migrainous symptoms. He denies any migraines currently.  I showed patient images online and discussed pathophysiology/etiolpogy of his symptoms.  - Symptoms have been ongoing for 40-50 years, he denies any migraines, symptoms consistent with occipital neuralgia origination from cervical spine elements, MRI of the brain was normal in the past,no current indication for repeat imaging of the brain. Exam is non-focal.  Nothing further from Neurology. Return to Dr. Lynann Bologna for further pain management and consideration of other procedures such as RFA of the occipital nerves as clinically warranted.    Cc: Phylliss Bob, MD,  Dr. Lahoma Rocker, [facility] [address] [place],  [address]  Phone [phone] Fax [phone]  I spent more than 45  minutes of face-to-face and non-face-to-face time with patient on the  1. Cervico-occipital neuralgia    diagnosis.  This included previsit chart review, lab review, study review, order entry, electronic health record documentation, patient education on the different diagnostic and therapeutic options, counseling and coordination of care, risks and benefits of management, compliance, or risk factor reduction.

## 2020-07-01 ENCOUNTER — Other Ambulatory Visit: Payer: Self-pay

## 2020-07-01 ENCOUNTER — Ambulatory Visit (INDEPENDENT_AMBULATORY_CARE_PROVIDER_SITE_OTHER): Payer: Medicare Other | Admitting: Neurology

## 2020-07-01 ENCOUNTER — Encounter: Payer: Self-pay | Admitting: Neurology

## 2020-07-01 VITALS — BP 129/82 | HR 56 | Ht 72.0 in | Wt 184.0 lb

## 2020-07-01 DIAGNOSIS — M5481 Occipital neuralgia: Secondary | ICD-10-CM | POA: Insufficient documentation

## 2020-07-01 NOTE — Patient Instructions (Signed)
Radio Frequency Ablation of the occipital nerves   Cervico-Occipital Neuralgia  Occipital neuralgia is a type of headache that causes brief episodes of very bad pain in the back of your head. Pain from occipital neuralgia may spread (radiate) to other parts of your head. These headaches may be caused by irritation of the nerves that leave your spinal cord high up in your neck, just below the base of your skull (occipital nerves). Your occipital nerves transmit sensations from the back of your head, the top of your head, and the areas behind your ears. What are the causes? This condition can occur without any known cause (primary headache syndrome). In other cases, this condition is caused by pressure on or irritation of one of the two occipital nerves. Pressure and irritation may be due to:  Muscle spasm in the neck.  Neck injury.  Wear and tear of the vertebrae in the neck (osteoarthritis).  Disease of the disks that separate the vertebrae.  What are the signs or symptoms? This condition causes brief burning, stabbing, electric, shocking, or shooting pain which can radiate to the top of the head. It can happen on one side or both sides of the head. It can also cause:  Pain behind the eye.  Pain triggered by neck movement or hair brushing.  Scalp tenderness.  Aching in the back of the head between episodes of very bad pain.  Pain gets worse with exposure to bright lights. How is this diagnosed? There is no test that diagnoses this condition. Your health care provider may diagnose this condition based on a physical exam and your symptoms. Other tests may be done, such as:  Imaging studies of the brain and neck (cervical spine), such as an MRI or CT scan. These look for causes of pinched nerves.  Applying pressure to the nerves in the neck to try to re-create the pain.  Injection of numbing medicine into the occipital nerve areas to see if pain goes away (diagnostic nerve  block). How is this treated? Treatment for this condition may begin with simple measures, such as:  Rest.  Massage.  Applying heat or cold on the area.  Over-the-counter pain relievers. If these measures do not work, you may need other treatments, including:  Medicines, such as: ? Prescription-strength anti-inflammatory medicines. ? Muscle relaxants. ? Anti-seizure medicines, which can relieve pain. ? Antidepressants, which can relieve pain. ? Injected medicines, such as medicines that numb the area (local anesthetic) and steroids.  Pulsed radiofrequency ablation. This is when wires are implanted to deliver electrical impulses that block pain signals from the occipital nerve.  Surgery to relieve nerve pressure.  Physical therapy. Follow these instructions at home: Pain management      Avoid any activities that cause pain.  Rest when you have an attack of pain.  Try gentle massage to relieve pain.  Try a different pillow or sleeping position.  If directed, apply heat to the affected area as told by your health care provider. Use the heat source that your health care provider recommends, such as a moist heat pack or a heating pad. ? Place a towel between your skin and the heat source. ? Leave the heat on for 20-30 minutes. ? Remove the heat if your skin turns bright red. This is especially important if you are unable to feel pain, heat, or cold. You may have a greater risk of getting burned.  If directed, apply ice to the back of the head and neck  area as told by your health care provider. ? Put ice in a plastic bag. ? Place a towel between your skin and the bag. ? Leave the ice on for 20 minutes, 2-3 times per day. General instructions  Take over-the-counter and prescription medicines only as told by your health care provider.  Avoid things that make your symptoms worse, such as bright lights.  Try to stay active. Get regular exercise that does not cause pain. Ask  your health care provider to suggest safe exercises for you.  Work with a physical therapist to learn stretching exercises you can do at home.  Practice good posture.  Keep all follow-up visits as told by your health care provider. This is important. Contact a health care provider if:  Your medicine is not working.  You have new or worsening symptoms. Get help right away if:  You have very bad head pain that does not go away.  You have a sudden change in vision, balance, or speech. Summary  Occipital neuralgia is a type of headache that causes brief episodes of very bad pain in the back of your head.  Pain from occipital neuralgia may spread (radiate) to other parts of your head.  Treatment for this condition includes rest, massage, and medicines. This information is not intended to replace advice given to you by your health care provider. Make sure you discuss any questions you have with your health care provider. Document Revised: 09/05/2017 Document Reviewed: 11/24/2016 Elsevier Patient Education  [employer] [employer].

## 2020-07-03 ENCOUNTER — Ambulatory Visit: Payer: Medicare Other | Admitting: Neurology

## 2021-01-07 ENCOUNTER — Other Ambulatory Visit: Payer: Self-pay | Admitting: Gastroenterology

## 2021-07-20 ENCOUNTER — Encounter: Payer: Self-pay | Admitting: General Surgery

## 2021-07-20 ENCOUNTER — Emergency Department
Admission: EM | Admit: 2021-07-20 | Discharge: 2021-07-20 | Discharge status: Against Medical Advice | Payer: Medicare Other

## 2021-07-20 NOTE — ED Notes (Signed)
No answer when called several times from lobby 

## 2021-07-21 ENCOUNTER — Emergency Department: Payer: Medicare Other

## 2021-07-21 ENCOUNTER — Encounter: Payer: Self-pay | Admitting: Emergency Medicine

## 2021-07-21 ENCOUNTER — Emergency Department
Admission: EM | Admit: 2021-07-21 | Discharge: 2021-07-21 | Disposition: A | Discharge status: Home / Self Care | Payer: Medicare Other | Attending: Emergency Medicine | Admitting: Emergency Medicine

## 2021-07-21 ENCOUNTER — Other Ambulatory Visit: Payer: Self-pay

## 2021-07-21 DIAGNOSIS — Z87891 Personal history of nicotine dependence: Secondary | ICD-10-CM | POA: Insufficient documentation

## 2021-07-21 DIAGNOSIS — W1789XA Other fall from one level to another, initial encounter: Secondary | ICD-10-CM | POA: Insufficient documentation

## 2021-07-21 DIAGNOSIS — J449 Chronic obstructive pulmonary disease, unspecified: Secondary | ICD-10-CM | POA: Diagnosis not present

## 2021-07-21 DIAGNOSIS — S299XXA Unspecified injury of thorax, initial encounter: Secondary | ICD-10-CM | POA: Diagnosis present

## 2021-07-21 DIAGNOSIS — E039 Hypothyroidism, unspecified: Secondary | ICD-10-CM | POA: Diagnosis not present

## 2021-07-21 DIAGNOSIS — S20212A Contusion of left front wall of thorax, initial encounter: Secondary | ICD-10-CM | POA: Insufficient documentation

## 2021-07-21 LAB — BASIC METABOLIC PANEL
Anion gap: 7 (ref 5–15)
BUN: 20 mg/dL (ref 8–23)
CO2: 22 mmol/L (ref 22–32)
Calcium: 9.3 mg/dL (ref 8.9–10.3)
Chloride: 109 mmol/L (ref 98–111)
Creatinine, Ser: 0.72 mg/dL (ref 0.61–1.24)
GFR, Estimated: >60 mL/min (ref >60)
Glucose, Bld: 126 mg/dL — ABNORMAL HIGH (ref 70–99)
Potassium: 3.8 mmol/L (ref 3.5–5.1)
Sodium: 138 mmol/L (ref 135–145)

## 2021-07-21 LAB — CBC
HCT: 39.5 % (ref 39.0–52.0)
Hemoglobin: 14.2 g/dL (ref 13.0–17.0)
MCH: 33.4 pg (ref 26.0–34.0)
MCHC: 35.9 g/dL (ref 30.0–36.0)
MCV: 92.9 fL (ref 80.0–100.0)
Platelets: 258 10*3/uL (ref 150–400)
RBC: 4.25 MIL/uL (ref 4.22–5.81)
RDW: 13 % (ref 11.5–15.5)
WBC: 6.3 10*3/uL (ref 4.0–10.5)
nRBC: 0 % (ref 0.0–0.2)

## 2021-07-21 LAB — TROPONIN I (HIGH SENSITIVITY): Troponin I (High Sensitivity): 3 ng/L (ref <18)

## 2021-07-21 MED ORDER — OXYCODONE-ACETAMINOPHEN 5-325 MG PO TABS
2.0000 | ORAL_TABLET | Freq: Once | ORAL | Status: DC
  Filled 2021-07-21: qty 2

## 2021-07-21 MED ORDER — PREDNISONE 20 MG PO TABS: 40.0000 mg | ORAL_TABLET | Freq: Every day | ORAL | 0 refills | Status: AC

## 2021-07-21 MED ORDER — OXYCODONE-ACETAMINOPHEN 5-325 MG PO TABS: 1.0000 | ORAL_TABLET | Freq: Four times a day (QID) | ORAL | 0 refills | Status: DC | PRN

## 2021-07-21 MED ORDER — OXYCODONE HCL 5 MG PO TABS: 5.0000 mg | ORAL_TABLET | Freq: Three times a day (TID) | ORAL | 0 refills | Status: DC | PRN

## 2021-07-21 MED ORDER — PREDNISONE 20 MG PO TABS
60.0000 mg | ORAL_TABLET | Freq: Once | ORAL | Status: AC
  Administered 2021-07-21: 60 mg via ORAL
  Filled 2021-07-21: qty 3

## 2021-07-21 MED ORDER — OXYCODONE HCL 5 MG PO TABS
10.0000 mg | ORAL_TABLET | Freq: Once | ORAL | Status: AC
Start: 2021-07-21 — End: 2021-07-21
  Administered 2021-07-21: 10 mg via ORAL
  Filled 2021-07-21: qty 2

## 2021-07-21 MED ORDER — ALBUTEROL SULFATE HFA 108 (90 BASE) MCG/ACT IN AERS
2.0000 | INHALATION_SPRAY | Freq: Once | RESPIRATORY_TRACT | Status: AC
  Administered 2021-07-21: 2 via RESPIRATORY_TRACT
  Filled 2021-07-21: qty 6.7

## 2021-07-21 MED ORDER — IBUPROFEN 600 MG PO TABS: 600.0000 mg | ORAL_TABLET | Freq: Three times a day (TID) | ORAL | 0 refills | Status: DC | PRN

## 2021-07-21 NOTE — ED Notes (Signed)
See triage note  presents s/p fall states he fell across a pipe on Monday  states he landed flat on anterior chest  having increased pain with movement and inspiration

## 2021-07-21 NOTE — ED Provider Notes (Signed)
[facility] Emergency Department Provider Note  ____________________________________________   Event Date/Time   First MD Initiated Contact with Patient 07/21/21 [id number]     (approximate)  I have reviewed the triage vital signs and the nursing notes.   HISTORY  Chief Complaint Rib pain and Back Pain    HPI Hayden Hall is a 65 y.o. male  here with chest pain. Pt reports that he is here with left rib pain. Pt fell getting something off a truck yesterday, landing on his left side on a nearby fence pole. Reports immediate onset sharp, stabbing, positional l chest pain that is worse w/ movement, palpation, and inspiration. He's had mild cough and wheezing. Reports some mild SOB with exertion/movement. No SOB at rest. No leg swelling. No head injury or other trauma. No numbness or weakness.       Past Medical History:  Diagnosis Date   Anxiety    Arthritis    RA    Asthma    Bipolar disorder (HCC)    BPH (benign prostatic hyperplasia)    Bronchitis    Cataract    Chronic constipation    Chronic pain    COPD (chronic obstructive pulmonary disease) (HCC)    Depression    Dry eye    GERD (gastroesophageal reflux disease)    Headache    migraines   Hepatitis C    " had treatment " per pt   Herpes    History of pancreatitis    History of seizure disorder    "never was identified specifically but was very suspicious of it"   Hypothyroidism    Memory impairment    Myoclonic jerking    Neck injury    Nocturia    Plantar fasciitis    right    Pneumonia    Rotator cuff tear    left   Schizophrenia (HCC)    SOB (shortness of breath)    Trigger finger    Urinary frequency    Wears glasses     Patient Active Problem List   Diagnosis Date Noted   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   Generalized abdominal pain    Acute esophagitis    Screen for colon cancer    Polyp of colon    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    Osteoarthritis of spine with radiculopathy, cervical region 02/13/2018   Cervicalgia 12/07/2017   History of rotator cuff surgery 12/07/2017   Cervical radiculopathy 12/07/2017   DDD (degenerative disc disease), cervical 12/07/2017   Chronic pain syndrome 12/07/2017   BPH with obstruction/lower urinary tract symptoms 10/21/2015   COPD, mild ([place]) 04/07/2014   Low back pain 01/09/2013    Past Surgical History:  Procedure Laterality Date   COLONOSCOPY  2011   COLONOSCOPY WITH PROPOFOL N/A 05/21/2020   Procedure: COLONOSCOPY WITH PROPOFOL;  Surgeon: Virgel Manifold, MD;  Location: [facility] ENDOSCOPY;  Service: Endoscopy;  Laterality: N/A;   ESOPHAGOGASTRODUODENOSCOPY (EGD) WITH PROPOFOL N/A 05/21/2020   Procedure: ESOPHAGOGASTRODUODENOSCOPY (EGD) WITH PROPOFOL;  Surgeon: Virgel Manifold, MD;  Location: [facility] ENDOSCOPY;  Service: Endoscopy;  Laterality: N/A;   ROTATOR CUFF REPAIR Left    ROTATOR CUFF REPAIR Right    SHOULDER ARTHROSCOPY Left 07/12/2018   Procedure: LEFT SHOULDER ARTHROSCOPIC ROTATOR CUFF TEAR VS. DEBRIDEMENT, SUBACROMIAL DECOMPRESSION;  Surgeon: Tania Ade, MD;  Location: [place];  Service: Orthopedics;  Laterality: Left;   TRIGGER FINGER RELEASE      Prior to Admission medications  Medication Sig Start Date End Date Taking? Authorizing Provider  ibuprofen (ADVIL) 600 MG tablet Take 1 tablet (600 mg total) by mouth every 8 (eight) hours as needed for mild pain. 07/21/21  Yes Duffy Bruce, MD  oxyCODONE (ROXICODONE) 5 MG immediate release tablet Take 1-2 tablets (5-10 mg total) by mouth every 8 (eight) hours as needed for severe pain or breakthrough pain (no more than 6 tabs daily). 07/21/21 07/21/22 Yes Duffy Bruce, MD  predniSONE (DELTASONE) 20 MG tablet Take 2 tablets (40 mg total) by mouth daily for 5 days. 07/21/21 07/26/21 Yes Duffy Bruce, MD  Cholecalciferol (VITAMIN D PO) Take 1 capsule by mouth daily.    [provider]  levothyroxine  (SYNTHROID, LEVOTHROID) 25 MCG tablet Take 25 mcg by mouth daily. 09/16/15   [provider]  Milk Thistle 1000 MG CAPS Take 1,000 mg by mouth 3 (three) times daily.    [provider]  Misc Natural Products (TURMERIC CURCUMIN) CAPS Take 1 capsule by mouth 2 (two) times daily.    [provider]  valACYclovir (VALTREX) 500 MG tablet Take 500 mg by mouth daily as needed (fever blisters).  01/17/11   [provider]    Allergies Vesicare [solifenacin]  Family History  Problem Relation Age of Onset   Breast cancer Mother    Kidney disease Mother    Bladder Cancer Mother    Migraines Mother    Heart Problems Mother    Heart disease Other    Kidney disease Cousin    Prostate cancer Father    Stroke Father    Aneurysm Father    Heart Problems Father    Cancer Sister        metastatic breast cancer   Migraines Sister    Cancer Sister        metastatic brain cancer   Migraines Sister    Cancer Sister        believes this was metastatic breast cancer   Stroke Paternal Grandfather    Kidney cancer Neg Hx     Social History Social History   Tobacco Use   Smoking status: Former    Types: Cigarettes   Smokeless tobacco: Never   Tobacco comments:    quit 20 years  Vaping Use   Vaping Use: Never used  Substance Use Topics   Alcohol use: Never    Alcohol/week: 0.0 standard drinks   Drug use: Never    Review of Systems  Review of Systems  Constitutional:  Positive for fatigue. Negative for chills and fever.  HENT:  Negative for sore throat.   Respiratory:  Positive for cough, shortness of breath and wheezing.   Cardiovascular:  Negative for chest pain.  Gastrointestinal:  Negative for abdominal pain.  Genitourinary:  Negative for flank pain.  Musculoskeletal:  Negative for neck pain.  Skin:  Negative for rash and wound.  Allergic/Immunologic: Negative for immunocompromised state.  Neurological:  Negative for weakness and numbness.   Hematological:  Does not bruise/bleed easily.  All other systems reviewed and are negative.   ____________________________________________  PHYSICAL EXAM:      VITAL SIGNS: ED Triage Vitals  Enc Vitals Group     BP 07/21/21 0702 133/84     Pulse Rate 07/21/21 0702 67     Resp 07/21/21 0702 20     Temp 07/21/21 0702 98 F (36.7 C)     Temp Source 07/21/21 0702 Oral     SpO2 07/21/21 0702 94 %  Weight 07/21/21 0703 160 lb (72.6 kg)     Height 07/21/21 0703 6' (1.829 m)     Head Circumference --      Peak Flow --      Pain Score 07/21/21 0702 10     Pain Loc --      Pain Edu? --      Excl. in [place]? --      Physical Exam Vitals and nursing note reviewed.  Constitutional:      General: He is not in acute distress.    Appearance: He is well-developed.  HENT:     Head: Normocephalic and atraumatic.  Eyes:     Conjunctiva/sclera: Conjunctivae normal.  Cardiovascular:     Rate and Rhythm: Normal rate and regular rhythm.     Heart sounds: Normal heart sounds. No murmur heard.   No friction rub.  Pulmonary:     Effort: Pulmonary effort is normal. No respiratory distress.     Breath sounds: Normal breath sounds. No wheezing or rales.  Chest:     Comments: Moderate TTP over left anterior and lateral inferior chest wall below the nipple line. No deformity. No crepitance. Abdominal:     General: There is no distension.     Palpations: Abdomen is soft.     Tenderness: There is no abdominal tenderness.  Musculoskeletal:     Cervical back: Neck supple.  Skin:    General: Skin is warm.     Capillary Refill: Capillary refill takes less than 2 seconds.     Findings: No rash.  Neurological:     Mental Status: He is alert and oriented to person, place, and time.     Motor: No abnormal muscle tone.      ____________________________________________   LABS (all labs ordered are listed, but only abnormal results are displayed)  Labs Reviewed  BASIC METABOLIC PANEL -  Abnormal; Notable for the following components:      Result Value   Glucose, Bld 126 (*)    All other components within normal limits  CBC  TROPONIN I (HIGH SENSITIVITY)    ____________________________________________  EKG: Normal sinus rhythm, VR 67. PR 156, QRS 88, QTc 407. No acute St elevations or depressions. No ischemia or infarct. ________________________________________  RADIOLOGY All imaging, including plain films, CT scans, and ultrasounds, independently reviewed by me, and interpretations confirmed via formal radiology reads.  ED MD interpretation:   CXR: Clear  Official radiology report(s): DG Chest 2 View  Result Date: 07/21/2021 CLINICAL DATA:  Pt comes into the ED via POV c/o left rib and back pain EXAM: CHEST - 2 VIEW COMPARISON:  02/19/2018 FINDINGS: Lungs are clear. Heart size and mediastinal contours are within normal limits. No effusion.  No pneumothorax. Postop changes at the left [facility] joint. IMPRESSION: No acute cardiopulmonary disease. Electronically Signed   By: Lucrezia Europe M.D.   On: 07/21/2021 07:33    ____________________________________________  PROCEDURES   Procedure(s) performed (including Critical Care):  Procedures  ____________________________________________  INITIAL IMPRESSION / MDM / [place] / ED COURSE  As part of my medical decision making, I reviewed the following data within the [place] notes reviewed and incorporated, Old chart reviewed, Notes from prior ED visits, and [place] Controlled Substance Database       *Hayden Hall was evaluated in Emergency Department on 07/21/2021 for the symptoms described in the history of present illness. He was evaluated in the context of the global COVID-19 pandemic,  which necessitated consideration that the patient might be at risk for infection with the SARS-CoV-2 virus that causes COVID-19. Institutional protocols and algorithms that pertain to the  evaluation of patients at risk for COVID-19 are in a state of rapid change based on information released by regulatory bodies including the CDC and federal and state organizations. These policies and algorithms were followed during the patient's care in the ED.  Some ED evaluations and interventions may be delayed as a result of limited staffing during the pandemic.*     Medical Decision Making: 65 year old male here with rib pain after mechanical fall.  Patient has significant chest wall tenderness, suspect rib sprain or contusion.  Chest x-ray reviewed and shows no displaced fractures, pneumothorax, or other abnormality.  EKG is nonischemic and troponin is negative, do not suspect ACS.  CBC without anemia or leukocytosis.  BMP unremarkable.  Will treat with analgesia, incentive spirometer, and good return precautions.  Discussed with patient who is in agreement.  Reviewed prior records.  Reviewed  database for narcotics.  ____________________________________________  FINAL CLINICAL IMPRESSION(S) / ED DIAGNOSES  Final diagnoses:  Contusion of rib on left side, initial encounter     MEDICATIONS GIVEN DURING THIS VISIT:  Medications  albuterol (VENTOLIN HFA) 108 (90 Base) MCG/ACT inhaler 2 puff (2 puffs Inhalation Given 07/21/21 0954)  predniSONE (DELTASONE) tablet 60 mg (60 mg Oral Given 07/21/21 0935)  oxyCODONE (Oxy IR/ROXICODONE) immediate release tablet 10 mg (10 mg Oral Given 07/21/21 1003)     ED Discharge Orders          Ordered    predniSONE (DELTASONE) 20 MG tablet  Daily        07/21/21 0929    oxyCODONE-acetaminophen (PERCOCET) 5-325 MG tablet  Every 6 hours PRN,   Status:  Discontinued        07/21/21 0929    ibuprofen (ADVIL) 600 MG tablet  Every 8 hours PRN        07/21/21 0934    oxyCODONE (ROXICODONE) 5 MG immediate release tablet  Every 8 hours PRN        07/21/21 0942             Note:  This document was prepared using Dragon voice recognition software  and may include unintentional dictation errors.   Duffy Bruce, MD 07/21/21 1322

## 2021-07-21 NOTE — ED Notes (Signed)
Dr Myrene Buddy informed that the pt didn't want the percocet due to it having tylenol in it and that he has a hx of hepatitis and states that the tylenol is bad for his liver

## 2021-07-21 NOTE — ED Triage Notes (Signed)
Pt comes into the ED via POV c/o left rib and back pain.  Pt states that on Monday he slipped and fell landing on the rail of a trailer.  Pt does admit to some chest discomfort as well.  PT explains she is more shob as well.  Pt currently has even and unlabored respirations at this time and is talking in full sentences. Pt has symmetrical chest expansion currently as well.

## 2021-08-13 ENCOUNTER — Emergency Department: Payer: Medicare Other

## 2021-08-13 ENCOUNTER — Other Ambulatory Visit: Payer: Self-pay

## 2021-08-13 ENCOUNTER — Encounter: Payer: Self-pay | Admitting: Emergency Medicine

## 2021-08-13 DIAGNOSIS — Z79899 Other long term (current) drug therapy: Secondary | ICD-10-CM | POA: Diagnosis not present

## 2021-08-13 DIAGNOSIS — K649 Unspecified hemorrhoids: Secondary | ICD-10-CM | POA: Diagnosis not present

## 2021-08-13 DIAGNOSIS — Z87891 Personal history of nicotine dependence: Secondary | ICD-10-CM | POA: Insufficient documentation

## 2021-08-13 DIAGNOSIS — J40 Bronchitis, not specified as acute or chronic: Secondary | ICD-10-CM | POA: Diagnosis not present

## 2021-08-13 DIAGNOSIS — E039 Hypothyroidism, unspecified: Secondary | ICD-10-CM | POA: Diagnosis not present

## 2021-08-13 DIAGNOSIS — Z20822 Contact with and (suspected) exposure to covid-19: Secondary | ICD-10-CM | POA: Insufficient documentation

## 2021-08-13 DIAGNOSIS — J449 Chronic obstructive pulmonary disease, unspecified: Secondary | ICD-10-CM | POA: Diagnosis not present

## 2021-08-13 LAB — BASIC METABOLIC PANEL
Anion gap: 7 (ref 5–15)
BUN: 18 mg/dL (ref 8–23)
CO2: 22 mmol/L (ref 22–32)
Calcium: 9.4 mg/dL (ref 8.9–10.3)
Chloride: 107 mmol/L (ref 98–111)
Creatinine, Ser: 0.86 mg/dL (ref 0.61–1.24)
GFR, Estimated: >60 mL/min (ref >60)
Glucose, Bld: 94 mg/dL (ref 70–99)
Potassium: 3.9 mmol/L (ref 3.5–5.1)
Sodium: 136 mmol/L (ref 135–145)

## 2021-08-13 LAB — CBC
HCT: 39.3 % (ref 39.0–52.0)
Hemoglobin: 13.4 g/dL (ref 13.0–17.0)
MCH: 32.2 pg (ref 26.0–34.0)
MCHC: 34.1 g/dL (ref 30.0–36.0)
MCV: 94.5 fL (ref 80.0–100.0)
Platelets: 274 10*3/uL (ref 150–400)
RBC: 4.16 MIL/uL — ABNORMAL LOW (ref 4.22–5.81)
RDW: 13.1 % (ref 11.5–15.5)
WBC: 6.8 10*3/uL (ref 4.0–10.5)
nRBC: 0 % (ref 0.0–0.2)

## 2021-08-13 LAB — URINALYSIS, COMPLETE (UACMP) WITH MICROSCOPIC
Bacteria, UA: NONE SEEN
Bilirubin Urine: NEGATIVE
Glucose, UA: NEGATIVE mg/dL
Hgb urine dipstick: NEGATIVE
Ketones, ur: NEGATIVE mg/dL
Leukocytes,Ua: NEGATIVE
Nitrite: NEGATIVE
Protein, ur: NEGATIVE mg/dL
Specific Gravity, Urine: 1.001 — ABNORMAL LOW (ref 1.005–1.030)
Squamous Epithelial / HPF: NONE SEEN (ref 0–5)
WBC, UA: NONE SEEN WBC/hpf (ref 0–5)
pH: 6 (ref 5.0–8.0)

## 2021-08-13 LAB — TROPONIN I (HIGH SENSITIVITY): Troponin I (High Sensitivity): 5 ng/L (ref <18)

## 2021-08-13 NOTE — ED Triage Notes (Signed)
Pt here with c/o hemorrhoid pain since Wed, states pain "goes up into my back, like there is a plate mid back more on my left side." Pt ambulatory to triage with no issues, states he is urinating a lot as well. Left rib pain from an old injury as well.

## 2021-08-14 ENCOUNTER — Emergency Department
Admission: EM | Admit: 2021-08-14 | Discharge: 2021-08-14 | Disposition: A | Discharge status: Home / Self Care | Payer: Medicare Other | Attending: Emergency Medicine | Admitting: Emergency Medicine

## 2021-08-14 DIAGNOSIS — J40 Bronchitis, not specified as acute or chronic: Secondary | ICD-10-CM

## 2021-08-14 DIAGNOSIS — K649 Unspecified hemorrhoids: Secondary | ICD-10-CM

## 2021-08-14 LAB — RESP PANEL BY RT-PCR (FLU A&B, COVID) ARPGX2
Influenza A by PCR: NEGATIVE
Influenza B by PCR: NEGATIVE
SARS Coronavirus 2 by RT PCR: NEGATIVE

## 2021-08-14 MED ORDER — PREDNISONE 20 MG PO TABS: 40.0000 mg | ORAL_TABLET | Freq: Every day | ORAL | 0 refills | Status: AC

## 2021-08-14 MED ORDER — HYDROCORTISONE ACETATE 25 MG RE SUPP: 25.0000 mg | Freq: Two times a day (BID) | RECTAL | 0 refills | Status: AC

## 2021-08-14 MED ORDER — KETOROLAC TROMETHAMINE 30 MG/ML IJ SOLN
30.0000 mg | Freq: Once | INTRAMUSCULAR | Status: AC
  Administered 2021-08-14: 30 mg via INTRAMUSCULAR
  Filled 2021-08-14: qty 1

## 2021-08-14 MED ORDER — AZITHROMYCIN 250 MG PO TABS: ORAL_TABLET | ORAL | 0 refills | Status: AC

## 2021-08-14 MED ORDER — HYDROCORTISONE ACETATE 25 MG RE SUPP: 25.0000 mg | Freq: Two times a day (BID) | RECTAL | 1 refills | Status: DC

## 2021-08-14 NOTE — ED Provider Notes (Signed)
[facility] Emergency Department Provider Note  ____________________________________________   Event Date/Time   First MD Initiated Contact with Patient 08/14/21 0010     (approximate)  I have reviewed the triage vital signs and the nursing notes.   HISTORY  Chief Complaint Hemorrhoids    HPI Hayden Hall is a 65 y.o. male with COPD, bipolar who comes in with concern for hemorrhoids patient reports having hemorrhoids previously.  He states that on Wednesday the pain started coming back and would radiate up into his back from his rectum.  He states that the pain has gotten better on its own and he used some hemorrhoid cream 1 time.  He states the pain is mostly gone but his wife told him to come in to make sure that he did not have cancer.  He is requesting a biopsy of the area.  He otherwise mentions having some left chest wall pain that is from an old injury.  On review of records patient was here 3 weeks ago with negative chest x-ray.  He reports having a lot more coughing and feeling like his bronchitis is back.  He does report a history of COPD and has an inhaler at home that he uses.          Past Medical History:  Diagnosis Date   Anxiety    Arthritis    RA    Asthma    Bipolar disorder (HCC)    BPH (benign prostatic hyperplasia)    Bronchitis    Cataract    Chronic constipation    Chronic pain    COPD (chronic obstructive pulmonary disease) (HCC)    Depression    Dry eye    GERD (gastroesophageal reflux disease)    Headache    migraines   Hepatitis C    " had treatment " per pt   Herpes    History of pancreatitis    History of seizure disorder    "never was identified specifically but was very suspicious of it"   Hypothyroidism    Memory impairment    Myoclonic jerking    Neck injury    Nocturia    Plantar fasciitis    right    Pneumonia    Rotator cuff tear    left   Schizophrenia (HCC)    SOB (shortness of breath)     Trigger finger    Urinary frequency    Wears glasses     Patient Active Problem List   Diagnosis Date Noted   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   Generalized abdominal pain    Acute esophagitis    Screen for colon cancer    Polyp of colon    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   Osteoarthritis of spine with radiculopathy, cervical region 02/13/2018   Cervicalgia 12/07/2017   History of rotator cuff surgery 12/07/2017   Cervical radiculopathy 12/07/2017   DDD (degenerative disc disease), cervical 12/07/2017   Chronic pain syndrome 12/07/2017   BPH with obstruction/lower urinary tract symptoms 10/21/2015   COPD, mild ([place]) 04/07/2014   Low back pain 01/09/2013    Past Surgical History:  Procedure Laterality Date   COLONOSCOPY  2011   COLONOSCOPY WITH PROPOFOL N/A 05/21/2020   Procedure: COLONOSCOPY WITH PROPOFOL;  Surgeon: Virgel Manifold, MD;  Location: [facility] ENDOSCOPY;  Service: Endoscopy;  Laterality: N/A;   ESOPHAGOGASTRODUODENOSCOPY (EGD) WITH PROPOFOL N/A 05/21/2020   Procedure: ESOPHAGOGASTRODUODENOSCOPY (EGD) WITH PROPOFOL;  Surgeon: Virgel Manifold, MD;  Location: [facility] ENDOSCOPY;  Service: Endoscopy;  Laterality: N/A;   ROTATOR CUFF REPAIR Left    ROTATOR CUFF REPAIR Right    SHOULDER ARTHROSCOPY Left 07/12/2018   Procedure: LEFT SHOULDER ARTHROSCOPIC ROTATOR CUFF TEAR VS. DEBRIDEMENT, SUBACROMIAL DECOMPRESSION;  Surgeon: Tania Ade, MD;  Location: [place];  Service: Orthopedics;  Laterality: Left;   TRIGGER FINGER RELEASE      Prior to Admission medications   Medication Sig Start Date End Date Taking? Authorizing Provider  Cholecalciferol (VITAMIN D PO) Take 1 capsule by mouth daily.    [provider]  ibuprofen (ADVIL) 600 MG tablet Take 1 tablet (600 mg total) by mouth every 8 (eight) hours as needed for mild pain. 07/21/21   Duffy Bruce, MD  levothyroxine (SYNTHROID, LEVOTHROID) 25 MCG tablet Take 25 mcg by mouth  daily. 09/16/15   [provider]  Milk Thistle 1000 MG CAPS Take 1,000 mg by mouth 3 (three) times daily.    [provider]  Misc Natural Products (TURMERIC CURCUMIN) CAPS Take 1 capsule by mouth 2 (two) times daily.    [provider]  oxyCODONE (ROXICODONE) 5 MG immediate release tablet Take 1-2 tablets (5-10 mg total) by mouth every 8 (eight) hours as needed for severe pain or breakthrough pain (no more than 6 tabs daily). 07/21/21 07/21/22  Duffy Bruce, MD  valACYclovir (VALTREX) 500 MG tablet Take 500 mg by mouth daily as needed (fever blisters).  01/17/11   [provider]    Allergies Vesicare [solifenacin]  Family History  Problem Relation Age of Onset   Breast cancer Mother    Kidney disease Mother    Bladder Cancer Mother    Migraines Mother    Heart Problems Mother    Heart disease Other    Kidney disease Cousin    Prostate cancer Father    Stroke Father    Aneurysm Father    Heart Problems Father    Cancer Sister        metastatic breast cancer   Migraines Sister    Cancer Sister        metastatic brain cancer   Migraines Sister    Cancer Sister        believes this was metastatic breast cancer   Stroke Paternal Grandfather    Kidney cancer Neg Hx     Social History Social History   Tobacco Use   Smoking status: Former    Types: Cigarettes   Smokeless tobacco: Never   Tobacco comments:    quit 20 years  Vaping Use   Vaping Use: Never used  Substance Use Topics   Alcohol use: Never    Alcohol/week: 0.0 standard drinks   Drug use: Never      Review of Systems Constitutional: No fever/chills Eyes: No visual changes. ENT: No sore throat. Cardiovascular: Chest pain Respiratory: Denies shortness of breath.  Cough Gastrointestinal: No abdominal pain.  No nausea, no vomiting.  No diarrhea.  No constipation. Genitourinary: Negative for dysuria.  Rectal pain Musculoskeletal: Negative for back pain. Skin:  Negative for rash. Neurological: Negative for headaches, focal weakness or numbness. All other ROS negative ____________________________________________   PHYSICAL EXAM:  VITAL SIGNS: ED Triage Vitals  Enc Vitals Group     BP 08/13/21 1713 107/84     Pulse Rate 08/13/21 1712 68     Resp 08/13/21 1712 16     Temp 08/13/21 1712 98.4 F (36.9 C)     Temp Source 08/13/21 1712 Oral  SpO2 08/13/21 1712 98 %     Weight 08/13/21 1713 168 lb (76.2 kg)     Height 08/13/21 1713 6\' 4"  (1.93 m)     Head Circumference --      Peak Flow --      Pain Score 08/13/21 1713 7     Pain Loc --      Pain Edu? --      Excl. in [place]? --     Constitutional: Alert and oriented. Well appearing and in no acute distress. Eyes: Conjunctivae are normal. EOMI. Head: Atraumatic. Nose: No congestion/rhinnorhea. Mouth/Throat: Mucous membranes are moist.   Neck: No stridor. Trachea Midline. FROM Cardiovascular: Normal rate, regular rhythm. Grossly normal heart sounds.  Good peripheral circulation.  Chest wall tenderness on the left Respiratory: Normal respiratory effort.  No retractions. Lungs CTAB. Gastrointestinal: Soft and nontender. No distention. No abdominal bruits.  Musculoskeletal: No lower extremity tenderness nor edema.  No joint effusions. Neurologic:  Normal speech and language. No gross focal neurologic deficits are appreciated.  Skin:  Skin is warm, dry and intact. No rash noted. Psychiatric: Mood and affect are normal. Speech and behavior are normal. GU: External hemorrhoid noted.  No gross blood  ____________________________________________   LABS (all labs ordered are listed, but only abnormal results are displayed)  Labs Reviewed  CBC - Abnormal; Notable for the following components:      Result Value   RBC 4.16 (*)    All other components within normal limits  URINALYSIS, COMPLETE (UACMP) WITH MICROSCOPIC - Abnormal; Notable for the following components:   Color, Urine COLORLESS  (*)    APPearance CLEAR (*)    Specific Gravity, Urine 1.001 (*)    All other components within normal limits  RESP PANEL BY RT-PCR (FLU A&B, COVID) ARPGX2  BASIC METABOLIC PANEL  TROPONIN I (HIGH SENSITIVITY)  TROPONIN I (HIGH SENSITIVITY)   ____________________________________________   ED ECG REPORT I, Vanessa [place], the attending physician, personally viewed and interpreted this ECG.  Normal sinus rate of 60, no ST elevation, no T wave inversions except for lead III, normal intervals ____________________________________________  RADIOLOGY Robert Bellow, personally viewed and evaluated these images (plain radiographs) as part of my medical decision making, as well as reviewing the written report by the radiologist.  ED MD interpretation: Mild bronchitic changes  Official radiology report(s): DG Chest 2 View  Result Date: 08/13/2021 CLINICAL DATA:  Chest pain EXAM: CHEST - 2 VIEW COMPARISON:  07/21/2021 FINDINGS: Hyperinflation with mild bronchitic change. No focal opacity or pleural effusion. Normal cardiomediastinal silhouette. No pneumothorax. IMPRESSION: Mild bronchitic changes.  No focal opacity. Electronically Signed   By: Donavan Foil M.D.   On: 08/13/2021 18:01    ____________________________________________   PROCEDURES  Procedure(s) performed (including Critical Care):  Procedures   ____________________________________________   INITIAL IMPRESSION / ASSESSMENT AND PLAN / ED COURSE  Hayden Hall was evaluated in Emergency Department on 08/14/2021 for the symptoms described in the history of present illness. He was evaluated in the context of the global COVID-19 pandemic, which necessitated consideration that the patient might be at risk for infection with the SARS-CoV-2 virus that causes COVID-19. Institutional protocols and algorithms that pertain to the evaluation of patients at risk for COVID-19 are in a state of rapid change based on information  released by regulatory bodies including the CDC and federal and state organizations. These policies and algorithms were followed during the patient's care in the ED.  Patient comes in with 2 separate complaints.  For the rectal pain I did do a rectal exam I see no evidence of abscess.  Rectal exam without any blood.  It appears he does have a little bit of an external hemorrhoid that is nonthrombosed in nature.  We will treat with sitz bath's, Anusol and follow-up with GI.  Explained that we do not do biopsy testing and that if he is concerned about rectal cancer he would need to see a GI doctor. , For the left chest wall pain patient had EKG, troponin, chest x-ray that were all reassuring.  His pain is reproducible on exam no evidence of ACS, pneumonia, low suspicion for dissection, PE.  Given dose of Toradol.  Given patient does smoke we will start on a short course of prednisone, azithromycin.  Also get COVID test.  Patient is requesting additional oxycodone.  Explained to patient that at this time given no new injury that is not indicated and that he should just take ibuprofen at home  Patient feels comfortable with plan and will discharge   ____________________________________________   FINAL CLINICAL IMPRESSION(S) / ED DIAGNOSES   Final diagnoses:  Bronchitis  Hemorrhoids, unspecified hemorrhoid type      MEDICATIONS GIVEN DURING THIS VISIT:  Medications  ketorolac (TORADOL) 30 MG/ML injection 30 mg (30 mg Intramuscular Given 08/14/21 0034)     ED Discharge Orders          Ordered    predniSONE (DELTASONE) 20 MG tablet  Daily with breakfast        08/14/21 0033    azithromycin (ZITHROMAX Z-PAK) 250 MG tablet        08/14/21 0033    hydrocortisone (ANUSOL-HC) 25 MG suppository  Every 12 hours,   Status:  Discontinued        08/14/21 0033    hydrocortisone (ANUSOL-HC) 25 MG suppository  Every 12 hours        08/14/21 0034             Note:  This document was  prepared using Dragon voice recognition software and may include unintentional dictation errors.    Vanessa [place], MD 08/14/21 [phone]

## 2021-08-14 NOTE — Discharge Instructions (Addendum)
Call the GI doctor to set up a follow-up appointment.  Take the steroids and antibiotics to help with your COPD flare.  You can use sitz bath's, increase your fiber to help with hemorrhoids  Follow-up in MyChart for your COVID, flu test

## 2021-08-31 ENCOUNTER — Ambulatory Visit: Payer: Medicare Other | Admitting: Student in an Organized Health Care Education/Training Program

## 2021-11-18 ENCOUNTER — Ambulatory Visit
Payer: Medicare Other | Attending: Student in an Organized Health Care Education/Training Program | Admitting: Student in an Organized Health Care Education/Training Program

## 2021-11-18 ENCOUNTER — Encounter: Payer: Self-pay | Admitting: Student in an Organized Health Care Education/Training Program

## 2021-11-18 ENCOUNTER — Other Ambulatory Visit: Payer: Self-pay

## 2021-11-18 VITALS — BP 122/84 | HR 65 | Temp 97.2°F | Resp 15 | Ht 72.0 in | Wt 172.0 lb

## 2021-11-18 DIAGNOSIS — M47812 Spondylosis without myelopathy or radiculopathy, cervical region: Secondary | ICD-10-CM | POA: Diagnosis present

## 2021-11-18 DIAGNOSIS — M542 Cervicalgia: Secondary | ICD-10-CM | POA: Insufficient documentation

## 2021-11-18 NOTE — Progress Notes (Signed)
Safety precautions to be maintained throughout the outpatient stay will include: orient to surroundings, keep bed in low position, maintain call bell within reach at all times, provide assistance with transfer out of bed and ambulation.  

## 2021-11-18 NOTE — Progress Notes (Signed)
PROVIDER NOTE: Information contained herein reflects review and annotations entered in association with encounter. Interpretation of such information and data should be left to medically-trained personnel. Information provided to patient can be located elsewhere in the medical record under "Patient Instructions". Document created using STT-dictation technology, any transcriptional errors that may result from process are unintentional.    Patient: Hayden Hall  Service Category: E/M  Provider: Gillis Santa, MD  DOB: 06-Oct-1955  DOS: 11/18/2021  Specialty: Interventional Pain Management  MRN: [id number]  Setting: Ambulatory outpatient  PCP: Theotis Burrow, MD  Type: Established Patient    Referring Provider: Theotis Burrow*  Location: Office  Delivery: Face-to-face     HPI  Hayden Hall, a 66 y.o. 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 Hayden Hall primary complain today is Neck Pain and Back Pain (mid) Last encounter: My last encounter with him was on 08/16/2018 Pertinent problems: Hayden Hall has Cervicalgia; DDD (degenerative disc disease), cervical; Cervico-occipital neuralgia; and Cervical facet joint syndrome on their pertinent problem list. Pain Assessment: Severity of Chronic pain is reported as a 7 /10. Location: Neck Left/sometimes worse on left side. Onset: More than a month ago. Quality: Other (Comment) (pinch, pull). Timing: Intermittent. Modifying factor(s): ice, heat. Vitals:  height is 6' (1.829 m) and weight is 172 lb (78 kg). His temporal temperature is 97.2 F (36.2 C) (abnormal). His blood pressure is 122/84 and his pulse is 65. His respiration is 15 and oxygen saturation is 100%.   Reason for encounter:   Worsening bilateral neck pain secondary to cervical facet joint syndrome, cervical spondylosis.  Patient is status post right C3, C4, C5, C6, C7 cervical facet medial branch nerve block 1 on  01/22/2018 that provided him with 85% pain relief for greater than 12 months.  He now returns with throbbing neck pain and has difficulty with range of motion.  He has tried performing home physical therapy exercises that he is learned in the past.  He is requesting to repeat prior cervical facet medial branch nerve blocks that he has had.  Future considerations include lumbar radiofrequency ablation.  ROS  Constitutional: Denies any fever or chills Gastrointestinal: No reported hemesis, hematochezia, vomiting, or acute GI distress Musculoskeletal:  Bilateral neck pain, worse with cervical extension Neurological: No reported episodes of acute onset apraxia, aphasia, dysarthria, agnosia, amnesia, paralysis, loss of coordination, or loss of consciousness  Medication Review  Milk Thistle, Turmeric Curcumin, Vitamin D, ibuprofen, levothyroxine, oxyCODONE, and valACYclovir  History Review  Allergy: Hayden Hall is allergic to vesicare [solifenacin]. Drug: Hayden Hall  reports no history of drug use. Alcohol:  reports no history of alcohol use. Tobacco:  reports that he has been smoking cigarettes and cigars. He has never used smokeless tobacco. Social: Hayden Hall  reports that he has been smoking cigarettes and cigars. He has never used smokeless tobacco. He reports that he does not drink alcohol and does not use drugs. Medical:  has a past medical history of Anxiety, Arthritis, Asthma, Bipolar disorder ([place]), BPH (benign prostatic hyperplasia), Bronchitis, Cataract, Chronic constipation, Chronic pain, COPD (chronic obstructive pulmonary disease) ([place]), Depression, Dry eye, GERD (gastroesophageal reflux disease), Headache, Hepatitis C, Herpes, History of pancreatitis, History of seizure disorder, Hypothyroidism, Memory impairment, Myoclonic jerking, Neck injury, Nocturia, Plantar fasciitis, Pneumonia, Rotator cuff tear, Schizophrenia ([place]), SOB (shortness of breath), Trigger finger, Urinary frequency, and  Wears glasses. Surgical: Hayden Hall  has a past surgical history that includes  Trigger finger release; Rotator cuff repair (Left); Rotator cuff repair (Right); Colonoscopy (2011); Shoulder arthroscopy (Left, 07/12/2018); Colonoscopy with propofol (N/A, 05/21/2020); and Esophagogastroduodenoscopy (egd) with propofol (N/A, 05/21/2020). Family: family history includes Aneurysm in his father; Bladder Cancer in his mother; Breast cancer in his mother; Cancer in his sister, sister, and sister; Heart Problems in his father and mother; Heart disease in an other family member; Kidney disease in his cousin and mother; Migraines in his mother, sister, and sister; Prostate cancer in his father; Stroke in his father and paternal grandfather.  Laboratory Chemistry Profile   Renal Lab Results  Component Value Date   BUN 18 08/13/2021   CREATININE 0.86 08/13/2021   GFRAA >60 07/12/2018   GFRNONAA >60 08/13/2021    Hepatic Lab Results  Component Value Date   AST 18 04/27/2018   ALT 13 04/27/2018   ALBUMIN 4.8 04/27/2018   ALKPHOS 46 04/27/2018   LIPASE 107 (H) 05/17/2017    Electrolytes Lab Results  Component Value Date   NA 136 08/13/2021   K 3.9 08/13/2021   CL 107 08/13/2021   CALCIUM 9.4 08/13/2021    Bone No results found for: VD25OH, VD125OH2TOT, TD4287GO1, LX7262MB5, 25OHVITD1, 25OHVITD2, 25OHVITD3, TESTOFREE, TESTOSTERONE  Inflammation (CRP: Acute Phase) (ESR: Chronic Phase) No results found for: CRP, ESRSEDRATE, LATICACIDVEN       Note: Above Lab results reviewed.  Recent Imaging Review  DG Chest 2 View CLINICAL DATA:  Chest pain  EXAM: CHEST - 2 VIEW  COMPARISON:  07/21/2021  FINDINGS: Hyperinflation with mild bronchitic change. No focal opacity or pleural effusion. Normal cardiomediastinal silhouette. No pneumothorax.  IMPRESSION: Mild bronchitic changes.  No focal opacity.  Electronically Signed   By: Donavan Foil M.D.   On: 08/13/2021 18:01 Note: Reviewed         CLINICAL DATA:  Eval neurological compression.   EXAM: MRI CERVICAL SPINE WITHOUT CONTRAST   TECHNIQUE: Multiplanar, multisequence MR imaging of the cervical spine was performed. No intravenous contrast was administered.   COMPARISON:  12/28/2017 MRI cervical spine.   FINDINGS: Alignment: Straightening of cervical lordosis. Minimal grade 1 C3-4 and C7-T1 anterolisthesis.   Vertebrae: Normal bone marrow signal intensity. No focal osseous lesion.   Cord: Normal signal and morphology.   Posterior Fossa, vertebral arteries: Negative.   Disc levels: Multilevel osteophytosis and desiccation. Mild C3-4 and moderate C4-7 disc space loss.   C2-3: Small central protrusion and bilateral facet degenerative spurring. No significant spinal canal or neural foraminal narrowing.   C3-4: Disc osteophyte complex with superimposed right subarticular protrusion and right predominant uncovertebral/facet hypertrophy. Severe right neural foraminal narrowing is unchanged. No significant spinal canal or left neural foraminal narrowing.   C4-5: Disc osteophyte complex with superimposed right paracentral protrusion abutting the ventral cord, uncovertebral and bilateral facet hypertrophy. Mild spinal canal and moderate bilateral neural foraminal narrowing, grossly unchanged.   C5-6: Disc osteophyte complex with small superimposed right paracentral protrusion abutting the ventral cord with bilateral uncovertebral and facet hypertrophy. Moderate spinal canal and bilateral neural foraminal narrowing, grossly unchanged.   C6-7: Disc osteophyte complex with superimposed central protrusion abutting the ventral cord, uncovertebral and bilateral facet hypertrophy. Mild spinal canal and bilateral neural foraminal narrowing.   C7-T1: Tiny central protrusion with bilateral facet degenerative spurring. No significant spinal canal or neural foraminal narrowing.   Paraspinal tissues: Within normal  limits.   IMPRESSION: Multilevel spondylosis, overall unchanged since 2019.   Moderate spinal canal and bilateral neural foraminal narrowing at the C5-6 level,  unchanged.   Severe right C3-4 and moderate bilateral C4-5 neural foraminal narrowing, unchanged.  Physical Exam  General appearance: Well nourished, well developed, and well hydrated. In no apparent acute distress Mental status: Alert, oriented x 3 (person, place, & time)       Respiratory: No evidence of acute respiratory distress Eyes: PERLA Vitals: BP 122/84    Pulse 65    Temp (!) 97.2 F (36.2 C) (Temporal)    Resp 15    Ht 6' (1.829 m)    Wt 172 lb (78 kg)    SpO2 100%    BMI 23.33 kg/m  BMI: Estimated body mass index is 23.33 kg/m as calculated from the following:   Height as of this encounter: 6' (1.829 m).   Weight as of this encounter: 172 lb (78 kg). Ideal: Ideal body weight: 77.6 kg (171 lb 1.2 oz) Adjusted ideal body weight: 77.8 kg (171 lb 7.1 oz)  Cervical Spine Area Exam  Skin & Axial Inspection: No masses, redness, edema, swelling, or associated skin lesions Alignment: Symmetrical Functional ROM: Pain restricted ROM, bilaterally Stability: No instability detected Muscle Tone/Strength: Functionally intact. No obvious neuro-muscular anomalies detected. Sensory (Neurological): Arthropathic arthralgia Palpation: Complains of area being tender to palpation             Upper Extremity (UE) Exam    Side: Right upper extremity  Side: Left upper extremity  Skin & Extremity Inspection: Skin color, temperature, and hair growth are WNL. No peripheral edema or cyanosis. No masses, redness, swelling, asymmetry, or associated skin lesions. No contractures.  Skin & Extremity Inspection: Skin color, temperature, and hair growth are WNL. No peripheral edema or cyanosis. No masses, redness, swelling, asymmetry, or associated skin lesions. No contractures.  Functional ROM: Unrestricted ROM          Functional ROM:  Unrestricted ROM          Muscle Tone/Strength: Functionally intact. No obvious neuro-muscular anomalies detected.  Muscle Tone/Strength: Functionally intact. No obvious neuro-muscular anomalies detected.  Sensory (Neurological): Unimpaired          Sensory (Neurological): Unimpaired          Palpation: No palpable anomalies              Palpation: No palpable anomalies              Provocative Test(s):  Phalen's test: deferred Tinel's test: deferred Apley's scratch test (touch opposite shoulder):  Action 1 (Across chest): deferred Action 2 (Overhead): deferred Action 3 (LB reach): deferred   Provocative Test(s):  Phalen's test: deferred Tinel's test: deferred Apley's scratch test (touch opposite shoulder):  Action 1 (Across chest): deferred Action 2 (Overhead): deferred Action 3 (LB reach): deferred     Assessment   Status Diagnosis  Having a Flare-up Having a Flare-up Having a Flare-up 1. Cervical facet joint syndrome   2. Cervical spondylosis   3. Cervicalgia      Updated Problems: Problem  Cervical Facet Joint Syndrome  Cervico-Occipital Neuralgia  Cervicalgia  Ddd (Degenerative Disc Disease), Cervical  Cervical Spondylosis    Plan of Care  Hayden Hall has been dealing with the above chronic pain for longer than three months and has either failed to respond, was unable to tolerate, or simply did not get enough benefit from other more conservative therapies including, but not limited to: 1. Over-the-counter medications: Naproxen, Ibuprofen 2. Anti-inflammatory medications: medrol dose pack 3. Muscle relaxants: flexeril, robaxin 4. Membrane stabilizers: gabapentin, lyrica 5. Opioids:  tramadol, hydrocodone, oxycodone 6. Physical therapy and/or chiropractic manipulation- ties to do home stretching exercises 7. Modalities (Heat, ice, etc.)  Recommend repeating bilateral cervical facet medial branch nerve blocks for cervical facet arthropathy.  Has responded  positively to these in the past done in 2019 on the right side but now having bilateral pain. Consider radiofrequency ablation in future.    Orders:  Orders Placed This Encounter  Procedures   CERVICAL FACET (MEDIAL BRANCH NERVE BLOCK)     Standing Status:   Future    Standing Expiration Date:   12/16/2021    Scheduling Instructions:     Side: Bilateral     Level: C4-5, C5-6 Facet joints  C4, C5, C6, Medial Branch Nerves)     Sedation: Patient's choice.     Timeframe: As soon as schedule allows    Order Specific Question:   Where will this procedure be performed?    Answer:   [facility]   Follow-up plan:   Return in about 2 weeks (around 12/02/2021) for B/L C-Fcts , in clinic [facility].    Recent Visits No visits were found meeting these conditions. Showing recent visits within past 90 days and meeting all other requirements Today's Visits Date Type Provider Dept  11/18/21 Office Visit Gillis Santa, MD [facility]  Showing today's visits and meeting all other requirements Future Appointments Date Type Provider Dept  12/08/21 Appointment Gillis Santa, MD [facility]  Showing future appointments within next 90 days and meeting all other requirements  I discussed the assessment and treatment plan with the patient. The patient was provided an opportunity to ask questions and all were answered. The patient agreed with the plan and demonstrated an understanding of the instructions.  Patient advised to call back or seek an in-person evaluation if the symptoms or condition worsens.  Duration of encounter: 35 minutes.  Note by: Gillis Santa, MD Date: 11/18/2021; Time: 8:36 AM

## 2021-11-18 NOTE — Patient Instructions (Signed)
Facet Blocks Patient Information  Description: The facets are joints in the spine between the vertebrae.  Like any joints in the body, facets can become irritated and painful.  Arthritis can also effect the facets.  By injecting steroids and local anesthetic in and around these joints, we can temporarily block the nerve supply to them.  Steroids act directly on irritated nerves and tissues to reduce selling and inflammation which often leads to decreased pain.  Facet blocks may be done anywhere along the spine from the neck to the low back depending upon the location of your pain.   After numbing the skin with local anesthetic (like Novocaine), a small needle is passed onto the facet joints under x-ray guidance.  You may experience a sensation of pressure while this is being done.  The entire block usually lasts about 15-25 minutes.   Conditions which may be treated by facet blocks:  Low back/buttock pain Neck/shoulder pain Certain types of headaches  Preparation for the injection:  Do not eat any solid food or dairy products within 8 hours of your appointment. You may drink clear liquid up to 3 hours before appointment.  Clear liquids include water, black coffee, juice or soda.  No milk or cream please. You may take your regular medication, including pain medications, with a sip of water before your appointment.  Diabetics should hold regular insulin (if taken separately) and take 1/2 normal NPH dose the morning of the procedure.  Carry some sugar containing items with you to your appointment. A driver must accompany you and be prepared to drive you home after your procedure. Bring all your current medications with you. An IV may be inserted and sedation may be given at the discretion of the physician. A blood pressure cuff, EKG and other monitors will often be applied during the procedure.  Some patients may need to have extra oxygen administered for a short period. You will be asked to  provide medical information, including your allergies and medications, prior to the procedure.  We must know immediately if you are taking blood thinners (like Coumadin/Warfarin) or if you are allergic to IV iodine contrast (dye).  We must know if you could possible be pregnant.  Possible side-effects:  Bleeding from needle site Infection (rare, may require surgery) Nerve injury (rare) Numbness & tingling (temporary) Difficulty urinating (rare, temporary) Spinal headache (a headache worse with upright posture) Light-headedness (temporary) Pain at injection site (serveral days) Decreased blood pressure (rare, temporary) Weakness in arm/leg (temporary) Pressure sensation in back/neck (temporary)   Call if you experience:  Fever/chills associated with headache or increased back/neck pain Headache worsened by an upright position New onset, weakness or numbness of an extremity below the injection site Hives or difficulty breathing (go to the emergency room) Inflammation or drainage at the injection site(s) Severe back/neck pain greater than usual New symptoms which are concerning to you  Please note:  Although the local anesthetic injected can often make your back or neck feel good for several hours after the injection, the pain will likely return. It takes 3-7 days for steroids to work.  You may not notice any pain relief for at least one week.  If effective, we will often do a series of 2-3 injections spaced 3-6 weeks apart to maximally decrease your pain.  After the initial series, you may be a candidate for a more permanent nerve block of the facets.  If you have any questions, please call [phone] [facility]   Pain Clinic 

## 2021-11-24 ENCOUNTER — Telehealth: Payer: Self-pay | Admitting: Student in an Organized Health Care Education/Training Program

## 2021-11-24 NOTE — Telephone Encounter (Signed)
Patient notified that we treat chronic pain and he would need to go to the ED or PCP to seek treatment at this time. Patient states understanding.

## 2021-11-24 NOTE — Telephone Encounter (Signed)
Patient called to say he fell a few days ago and hurt his knee, hip, and ribs. He says he talked to PCP and they did xrays of ribs. He says his hip is hurting so bad he makes him hell out when he tries to walk on it, as well as his knee. He want to know if he can get in to see Dr. Holley Raring. I tried to explain he should go to urgent care or ortho to have xrays and get this checked as it is new acute pain and we dont treat acute pain. Please call patient and advise.

## 2021-11-25 ENCOUNTER — Other Ambulatory Visit: Payer: Self-pay | Admitting: Physician Assistant

## 2021-11-25 DIAGNOSIS — Z9181 History of falling: Secondary | ICD-10-CM

## 2021-11-26 ENCOUNTER — Emergency Department: Payer: Medicare Other

## 2021-11-26 ENCOUNTER — Emergency Department
Admission: EM | Admit: 2021-11-26 | Discharge: 2021-11-26 | Disposition: A | Discharge status: Home / Self Care | Payer: Medicare Other | Attending: Emergency Medicine | Admitting: Emergency Medicine

## 2021-11-26 ENCOUNTER — Encounter: Payer: Self-pay | Admitting: Intensive Care

## 2021-11-26 ENCOUNTER — Other Ambulatory Visit: Payer: Self-pay

## 2021-11-26 DIAGNOSIS — S86912A Strain of unspecified muscle(s) and tendon(s) at lower leg level, left leg, initial encounter: Secondary | ICD-10-CM | POA: Diagnosis not present

## 2021-11-26 DIAGNOSIS — S8992XA Unspecified injury of left lower leg, initial encounter: Secondary | ICD-10-CM | POA: Diagnosis present

## 2021-11-26 DIAGNOSIS — W1830XA Fall on same level, unspecified, initial encounter: Secondary | ICD-10-CM | POA: Insufficient documentation

## 2021-11-26 DIAGNOSIS — M25462 Effusion, left knee: Secondary | ICD-10-CM | POA: Diagnosis not present

## 2021-11-26 MED ORDER — HYDROCODONE-ACETAMINOPHEN 5-325 MG PO TABS
1.0000 | ORAL_TABLET | Freq: Once | ORAL | Status: AC
  Administered 2021-11-26: 1 via ORAL
  Filled 2021-11-26: qty 1

## 2021-11-26 MED ORDER — OXYCODONE HCL 5 MG PO TABS: 5.0000 mg | ORAL_TABLET | Freq: Three times a day (TID) | ORAL | 0 refills | Status: AC | PRN

## 2021-11-26 MED ORDER — CYCLOBENZAPRINE HCL 5 MG PO TABS: 5.0000 mg | ORAL_TABLET | Freq: Three times a day (TID) | ORAL | 0 refills | Status: DC | PRN

## 2021-11-26 NOTE — Discharge Instructions (Addendum)
Your exam and XR show a fluid collection on the knee, related to your fall. There is no evidence of a fracture or dislocation. Take the prescription meds as directed. Follow-up with your primary provider or Ortho for continued problems.

## 2021-11-26 NOTE — ED Triage Notes (Signed)
Patient reports falling Monday. C/o left knee pain. Hx of left knee problems but reports it is worse now. Had Xray of left rib cage and showed no fracture. A&O x4.

## 2021-11-26 NOTE — ED Provider Notes (Signed)
[facility] Emergency Department Provider Note     Event Date/Time   First MD Initiated Contact with Patient 11/26/21 [phone]     (approximate)   History   Fall and Knee Pain   HPI  Hayden Hall is a 66 y.o. male with a history of DDD, depression, anxiety, schizophrenia, and chronic pain syndrome currently under the care of the pain management clinic, presents for acute left knee pain.  Patient reports mechanical fall 3 to 4 days ago.  Patient was apparently evaluated with outpatient radiology for pain to the rib cage, found to have a negative x-ray for acute rib fracture or pneumothorax.  He presents today due to ongoing left knee pain including swelling and disability.   Physical Exam   Triage Vital Signs: ED Triage Vitals  Enc Vitals Group     BP 11/26/21 0936 108/83     Pulse Rate 11/26/21 0936 82     Resp 11/26/21 0936 14     Temp 11/26/21 0936 98.5 F (36.9 C)     Temp Source 11/26/21 0936 Oral     SpO2 11/26/21 0936 97 %     Weight 11/26/21 0934 157 lb (71.2 kg)     Height 11/26/21 0934 6' (1.829 m)     Head Circumference --      Peak Flow --      Pain Score 11/26/21 0932 10     Pain Loc --      Pain Edu? --      Excl. in [place]? --     Most recent vital signs: Vitals:   11/26/21 0936  BP: 108/83  Pulse: 82  Resp: 14  Temp: 98.5 F (36.9 C)  SpO2: 97%    General Awake, no distress.  CV:  Good peripheral perfusion.  RESP:  Normal effort. CTA ABD:  No distention.  MSK:  Left knee without obvious deformity or dislocation.  Patient with moderate prepatellar effusion appreciated.  Normal active range of motion with some patella ballottement appreciated no valgus or varus stress elicited.  No posterior popliteal space pain.  No calf or Achilles tenderness noted distally.  Overlying skin is without erythema, warmth, or abrasion.   ED Results / Procedures / Treatments   Labs (all labs ordered are listed, but only abnormal  results are displayed) Labs Reviewed - No data to display   EKG   RADIOLOGY  I personally viewed and evaluated these images as part of my medical decision making, as well as reviewing the written report by the radiologist.  ED Provider Interpretation: no acute fracture}  DG Knee Complete 4 Views Left  Result Date: 11/26/2021 CLINICAL DATA:  Pain status post fall. Additional history provided: Patient reports fall on Monday, left knee pain. EXAM: LEFT KNEE - COMPLETE 4+ VIEW COMPARISON:  Radiographs of the bilateral knees 08/22/2018 (images available, report unavailable). FINDINGS: There is normal bony alignment. No acute fracture is identified. The joint spaces are maintained. Large suprapatellar joint effusion. IMPRESSION: No acute fracture is identified. Large suprapatellar joint effusion. Electronically Signed   By: Kellie Simmering D.O.   On: 11/26/2021 10:16     PROCEDURES:  Critical Care performed: No  Procedures   MEDICATIONS ORDERED IN ED: Medications  HYDROcodone-acetaminophen (NORCO/VICODIN) 5-325 MG per tablet 1 tablet (1 tablet Oral Given 11/26/21 1042)     IMPRESSION / MDM / ASSESSMENT AND PLAN / ED COURSE  I reviewed the triage vital signs and the nursing notes.  Differential diagnosis includes, but is not limited to, patellar fracture, patella dislocation, joint effusion, knee sprain/strain  Patient with ED evaluation of injury sustained following mechanical fall earlier in the week.  Patient presents with ongoing knee pain and swelling.  He is evaluated for his complaints, found to have no signs of internal derangement.  He does have a large effusion as confirmed on x-rays.  Further review of those images does not reveal any acute fracture or dislocation.  Patient's diagnosis is consistent with knee sprain with joint effusion. Patient will be discharged home with prescriptions for Flexeril and oxycodone (#9).  Patient is placed in a neoprene  knee brace for support and crutches are provided for ambulatory assistance.  Patient is to follow up with his primary provider or orthopedics as needed or otherwise directed. Patient is given ED precautions to return to the ED for any worsening or new symptoms.   FINAL CLINICAL IMPRESSION(S) / ED DIAGNOSES   Final diagnoses:  Knee strain, left, initial encounter  Effusion of left knee     Rx / DC Orders   ED Discharge Orders          Ordered    oxyCODONE (ROXICODONE) 5 MG immediate release tablet  Every 8 hours PRN        11/26/21 1032    cyclobenzaprine (FLEXERIL) 5 MG tablet  3 times daily PRN        11/26/21 1032             Note:  This document was prepared using Dragon voice recognition software and may include unintentional dictation errors.    Melvenia Needles, PA-C 11/26/21 1405    Carrie Mew, MD 11/28/21 [phone]

## 2021-11-26 NOTE — ED Notes (Signed)
See triage note  presents with left knee pain  states he fell on Monday  states he hit his head on car  fell  hitting left lateral rib area,left hip and left knee pain

## 2021-12-08 ENCOUNTER — Encounter: Payer: Self-pay | Admitting: Student in an Organized Health Care Education/Training Program

## 2021-12-08 ENCOUNTER — Ambulatory Visit
Admission: RE | Admit: 2021-12-08 | Discharge: 2021-12-08 | Disposition: A | Discharge status: Home / Self Care | Payer: Medicare Other | Source: Ambulatory Visit | Attending: Student in an Organized Health Care Education/Training Program | Admitting: Student in an Organized Health Care Education/Training Program

## 2021-12-08 ENCOUNTER — Ambulatory Visit (HOSPITAL_BASED_OUTPATIENT_CLINIC_OR_DEPARTMENT_OTHER): Payer: Medicare Other | Admitting: Student in an Organized Health Care Education/Training Program

## 2021-12-08 ENCOUNTER — Other Ambulatory Visit: Payer: Self-pay

## 2021-12-08 VITALS — BP 116/89 | HR 80 | Temp 98.8°F | Resp 16 | Ht 73.0 in | Wt 157.0 lb

## 2021-12-08 DIAGNOSIS — M542 Cervicalgia: Secondary | ICD-10-CM

## 2021-12-08 DIAGNOSIS — M47812 Spondylosis without myelopathy or radiculopathy, cervical region: Secondary | ICD-10-CM

## 2021-12-08 DIAGNOSIS — M50322 Other cervical disc degeneration at C5-C6 level: Secondary | ICD-10-CM | POA: Diagnosis not present

## 2021-12-08 DIAGNOSIS — Z79899 Other long term (current) drug therapy: Secondary | ICD-10-CM | POA: Insufficient documentation

## 2021-12-08 DIAGNOSIS — Z791 Long term (current) use of non-steroidal anti-inflammatories (NSAID): Secondary | ICD-10-CM | POA: Diagnosis not present

## 2021-12-08 DIAGNOSIS — M47892 Other spondylosis, cervical region: Secondary | ICD-10-CM | POA: Insufficient documentation

## 2021-12-08 DIAGNOSIS — M50321 Other cervical disc degeneration at C4-C5 level: Secondary | ICD-10-CM | POA: Diagnosis not present

## 2021-12-08 DIAGNOSIS — Z79890 Hormone replacement therapy (postmenopausal): Secondary | ICD-10-CM | POA: Diagnosis not present

## 2021-12-08 MED ORDER — ROPIVACAINE HCL 2 MG/ML IJ SOLN
18.0000 mL | Freq: Once | INTRAMUSCULAR | Status: AC
  Administered 2021-12-08: 18 mL via PERINEURAL
  Filled 2021-12-08: qty 20

## 2021-12-08 MED ORDER — DEXAMETHASONE SODIUM PHOSPHATE 10 MG/ML IJ SOLN
20.0000 mg | Freq: Once | INTRAMUSCULAR | Status: AC
  Administered 2021-12-08: 20 mg
  Filled 2021-12-08: qty 2

## 2021-12-08 MED ORDER — LIDOCAINE HCL 2 % IJ SOLN
20.0000 mL | Freq: Once | INTRAMUSCULAR | Status: AC
  Administered 2021-12-08: 400 mg
  Filled 2021-12-08: qty 20

## 2021-12-08 NOTE — Progress Notes (Signed)
Safety precautions to be maintained throughout the outpatient stay will include: orient to surroundings, keep bed in low position, maintain call bell within reach at all times, provide assistance with transfer out of bed and ambulation.  

## 2021-12-08 NOTE — Progress Notes (Signed)
Patient's Name: Hayden Hall  MRN: [id number]  Referring Provider: Theotis Burrow*  DOB: 06-27-1956  PCP: Theotis Burrow, MD  DOS: 12/08/2021  Note by: Gillis Santa, MD  Service setting: Ambulatory outpatient  Specialty: Interventional Pain Management  Patient type: Established  Location: [facility] (AMB) Pain Management Facility  Visit type: Interventional Procedure   Primary Reason for Visit: Interventional Pain Management Treatment. CC: Neck Pain  Procedure:       Anesthesia, Analgesia, Anxiolysis:  Type: Cervical Facet Medial Branch Block(s) #2  Primary Purpose: Therapeutic Region: Posterolateral cervical spine Level: C4, C5, C6, Medial Branch Level(s). Injecting these levels blocks the C4-5, C5-6,  cervical facet joints. Laterality: Bilateral Paraspinal  Type: Local Anesthesia Indication(s): Analgesia         Route: Infiltration (Addison/IM) IV Access: Declined Sedation: None since NPO instructions were not followed  Local Anesthetic: Lidocaine 1-2%   Indications: 1. Cervical facet joint syndrome   2. Cervical spondylosis   3. Cervicalgia     Pain Score: Pre-procedure: 10-Worst pain ever/10 Post-procedure: 0-No pain/10  Pre-op Assessment:  Mr. Tugwell is a 66 y.o. (year old), male patient, seen today for interventional treatment. He  has a past surgical history that includes Trigger finger release; Rotator cuff repair (Left); Rotator cuff repair (Right); Colonoscopy (2011); Shoulder arthroscopy (Left, 07/12/2018); Colonoscopy with propofol (N/A, 05/21/2020); and Esophagogastroduodenoscopy (egd) with propofol (N/A, 05/21/2020). Mr. Bohlken has a current medication list which includes the following prescription(s): cyclobenzaprine, ibuprofen, levothyroxine, vitamin d, milk thistle, turmeric curcumin, and valacyclovir. His primarily concern today is the Neck Pain  Initial Vital Signs:  Pulse/HCG Rate: 80ECG Heart Rate: 71 Temp: 98.8 F (37.1 C) Resp: 18 BP:  117/87 SpO2: 100 %  BMI: Estimated body mass index is 20.71 kg/m as calculated from the following:   Height as of this encounter: '6\' 1"'$  (1.854 m).   Weight as of this encounter: 157 lb (71.2 kg).  Risk Assessment: Allergies: Reviewed. He is allergic to vesicare [solifenacin].  Allergy Precautions: None required Coagulopathies: Reviewed. None identified.  Blood-thinner therapy: None at this time Active Infection(s): Reviewed. None identified. Mr. Auxier is afebrile  Site Confirmation: Mr. Hustead was asked to confirm the procedure and laterality before marking the site Procedure checklist: Completed Consent: Before the procedure and under the influence of no sedative(s), amnesic(s), or anxiolytics, the patient was informed of the treatment options, risks and possible complications. To fulfill our ethical and legal obligations, as recommended by the American Medical Association's Code of Ethics, I have informed the patient of my clinical impression; the nature and purpose of the treatment or procedure; the risks, benefits, and possible complications of the intervention; the alternatives, including doing nothing; the risk(s) and benefit(s) of the alternative treatment(s) or procedure(s); and the risk(s) and benefit(s) of doing nothing. The patient was provided information about the general risks and possible complications associated with the procedure. These may include, but are not limited to: failure to achieve desired goals, infection, bleeding, organ or nerve damage, allergic reactions, paralysis, and death. In addition, the patient was informed of those risks and complications associated to Spine-related procedures, such as failure to decrease pain; infection (i.e.: Meningitis, epidural or intraspinal abscess); bleeding (i.e.: epidural hematoma, subarachnoid hemorrhage, or any other type of intraspinal or peri-dural bleeding); organ or nerve damage (i.e.: Any type of peripheral nerve, nerve  root, or spinal cord injury) with subsequent damage to sensory, motor, and/or autonomic systems, resulting in permanent pain, numbness, and/or weakness of one or several areas  of the body; allergic reactions; (i.e.: anaphylactic reaction); and/or death. Furthermore, the patient was informed of those risks and complications associated with the medications. These include, but are not limited to: allergic reactions (i.e.: anaphylactic or anaphylactoid reaction(s)); adrenal axis suppression; blood sugar elevation that in diabetics may result in ketoacidosis or comma; water retention that in patients with history of congestive heart failure may result in shortness of breath, pulmonary edema, and decompensation with resultant heart failure; weight gain; swelling or edema; medication-induced neural toxicity; particulate matter embolism and blood vessel occlusion with resultant organ, and/or nervous system infarction; and/or aseptic necrosis of one or more joints. Finally, the patient was informed that Medicine is not an exact science; therefore, there is also the possibility of unforeseen or unpredictable risks and/or possible complications that may result in a catastrophic outcome. The patient indicated having understood very clearly. We have given the patient no guarantees and we have made no promises. Enough time was given to the patient to ask questions, all of which were answered to the patient's satisfaction. Mr. Shuey has indicated that he wanted to continue with the procedure. Attestation: I, the ordering provider, attest that I have discussed with the patient the benefits, risks, side-effects, alternatives, likelihood of achieving goals, and potential problems during recovery for the procedure that I have provided informed consent. Date   Time: 12/08/2021 10:12 AM  Pre-Procedure Preparation:  Monitoring: As per clinic protocol. Respiration, ETCO2, SpO2, BP, heart rate and rhythm monitor placed and checked  for adequate function Safety Precautions: Patient was assessed for positional comfort and pressure points before starting the procedure. Time-out: I initiated and conducted the "Time-out" before starting the procedure, as per protocol. The patient was asked to participate by confirming the accuracy of the "Time Out" information. Verification of the correct person, site, and procedure were performed and confirmed by me, the nursing staff, and the patient. "Time-out" conducted as per Joint Commission's Universal Protocol (UP.01.01.01). Time: 1100  Description of Procedure:       Position: Prone with head of the table raised to facilitate breathing. Laterality: Bilateral. The procedure was performed in identical fashion on both sides. Level:  C4, C5, C6, Medial Branch Level(s). Area Prepped: Posterior Cervico-thoracic Region Prepping solution: ChloraPrep (2% chlorhexidine gluconate and 70% isopropyl alcohol) Safety Precautions: Aspiration looking for blood return was conducted prior to all injections. At no point did we inject any substances, as a needle was being advanced. Before injecting, the patient was told to immediately notify me if he was experiencing any new onset of "ringing in the ears, or metallic taste in the mouth". No attempts were made at seeking any paresthesias. Safe injection practices and needle disposal techniques used. Medications properly checked for expiration dates. SDV (single dose vial) medications used. After the completion of the procedure, all disposable equipment used was discarded in the proper designated medical waste containers. Local Anesthesia: Protocol guidelines were followed. The patient was positioned over the fluoroscopy table. The area was prepped in the usual manner. The time-out was completed. The target area was identified using fluoroscopy. A 12-in long, straight, sterile hemostat was used with fluoroscopic guidance to locate the targets for each level blocked.  Once located, the skin was marked with an approved surgical skin marker. Once all sites were marked, the skin (epidermis, dermis, and hypodermis), as well as deeper tissues (fat, connective tissue and muscle) were infiltrated with a small amount of a short-acting local anesthetic, loaded on a 10cc syringe with a 25G, 1.5-in  Needle. An appropriate amount of time was allowed for local anesthetics to take effect before proceeding to the next step. Local Anesthetic: Lidocaine 1.0% The unused portion of the local anesthetic was discarded in the proper designated containers. Technical explanation of process:  C4 Medial Branch Nerve Block (MBB): The target area for the C4 dorsal medial articular branch is the lateral concave waist of the articular pillar of C4. Under fluoroscopic guidance, a Quincke needle was inserted until contact was made with os over the postero-lateral aspect of the articular pillar of C4 (target area). After negative aspiration for blood, 1 mL of the nerve block solution was injected without difficulty or complication. The needle was removed intact. C5 Medial Branch Nerve Block (MBB): The target area for the C5 dorsal medial articular branch is the lateral concave waist of the articular pillar of C5. Under fluoroscopic guidance, a Quincke needle was inserted until contact was made with os over the postero-lateral aspect of the articular pillar of C5 (target area). After negative aspiration for blood, 1 mL of the nerve block solution was injected without difficulty or complication. The needle was removed intact. C6 Medial Branch Nerve Block (MBB): The target area for the C6 dorsal medial articular branch is the lateral concave waist of the articular pillar of C6. Under fluoroscopic guidance, a Quincke needle was inserted until contact was made with os over the postero-lateral aspect of the articular pillar of C6 (target area). After negative aspiration for blood, 73m of the nerve block solution  was injected without difficulty or complication. The needle was removed intact.  Nerve block solution: 6 cc solution made of 4 cc of 0.2% ropivacaine, 2 cc of Decadron 10 mg/cc.  1 cc injected at each level as stated above, bilaterally .    Once the entire procedure was completed, the treated area was cleaned, making sure to leave some of the prepping solution back to take advantage of its long term bactericidal properties.  Vitals:   12/08/21 1108 12/08/21 1111 12/08/21 1116 12/08/21 1125  BP: 111/86 (!) 125/91 123/85 116/89  Pulse:      Resp: 16 (!) '22 15 16  '$ Temp:      TempSrc:      SpO2: 99% 99% 100% 100%  Weight:      Height:        Start Time: 1100 hrs. End Time: 1118 hrs.  Imaging Guidance (Spinal):  Type of Imaging Technique: Fluoroscopy Guidance (Spinal) Indication(s): Assistance in needle guidance and placement for procedures requiring needle placement in or near specific anatomical locations not easily accessible without such assistance. Exposure Time: Please see nurses notes. Contrast: None used. Fluoroscopic Guidance: I was personally present during the use of fluoroscopy. "Tunnel Vision Technique" used to obtain the best possible view of the target area. Parallax error corrected before commencing the procedure. "Direction-depth-direction" technique used to introduce the needle under continuous pulsed fluoroscopy. Once target was reached, antero-posterior, oblique, and lateral fluoroscopic projection used confirm needle placement in all planes. Images permanently stored in EMR. Interpretation: No contrast injected. I personally interpreted the imaging intraoperatively. Adequate needle placement confirmed in multiple planes. Permanent images saved into the patient's record.   Post-operative Assessment:  Post-procedure Vital Signs:  Pulse/HCG Rate: 8069 Temp: 98.8 F (37.1 C) Resp: 16 BP: 116/89 SpO2: 100 %  EBL: None  Complications: No immediate post-treatment  complications observed by team, or reported by patient.  Note: The patient tolerated the entire procedure well. A repeat set of vitals were taken  after the procedure and the patient was kept under observation following institutional policy, for this type of procedure. Post-procedural neurological assessment was performed, showing return to baseline, prior to discharge. The patient was provided with post-procedure discharge instructions, including a section on how to identify potential problems. Should any problems arise concerning this procedure, the patient was given instructions to immediately contact us, at any time, without hesitation. In any case, we plan to contact the patient by telephone for a follow-up status report regarding this interventional procedure.  Comments:  No additional relevant information.  5 out of 5 strength bilateral upper extremity: Shoulder abduction, elbow flexion, elbow extension, thumb extension.   Plan of Care    Imaging Orders         DG PAIN [facility] C-ARM 1-60 MIN NO REPORT     Medications ordered for procedure: Meds ordered this encounter  Medications   lidocaine (XYLOCAINE) 2 % (with pres) injection 400 mg   dexamethasone (DECADRON) injection 20 mg   ropivacaine (PF) 2 mg/mL (0.2%) (NAROPIN) injection 18 mL   Medications administered: We administered lidocaine, dexamethasone, and ropivacaine (PF) 2 mg/mL (0.2%).  See the medical record for exact dosing, route, and time of administration.   Disposition: Discharge home  Discharge Date & Time: 12/08/2021;   hrs.   Physician-requested Follow-up: Return in about 4 weeks (around 01/05/2022) for PPE VV 4 - 5 weeks,. .  Future Appointments  Date Time Provider [place]  01/05/2022  4:00 PM Gillis Santa, MD [facility] None    Primary Care Physician: Theotis Burrow, MD Location: [facility] Note by: Gillis Santa, MD Date: 12/08/2021; Time: 11:36 AM  Disclaimer:   Medicine is not an exact science. The only guarantee in medicine is that nothing is guaranteed. It is important to note that the decision to proceed with this intervention was based on the information collected from the patient. The Data and conclusions were drawn from the patient's questionnaire, the interview, and the physical examination. Because the information was provided in large part by the patient, it cannot be guaranteed that it has not been purposely or unconsciously manipulated. Every effort has been made to obtain as much relevant data as possible for this evaluation. It is important to note that the conclusions that lead to this procedure are derived in large part from the available data. Always take into account that the treatment will also be dependent on availability of resources and existing treatment guidelines, considered by other Pain Management Practitioners as being common knowledge and practice, at the time of the intervention. For Medico-Legal purposes, it is also important to point out that variation in procedural techniques and pharmacological choices are the acceptable norm. The indications, contraindications, technique, and results of the above procedure should only be interpreted and judged by a Board-Certified Interventional Pain Specialist with extensive familiarity and expertise in the same exact procedure and technique.

## 2021-12-08 NOTE — Patient Instructions (Signed)

## 2021-12-09 ENCOUNTER — Telehealth: Payer: Self-pay | Admitting: *Deleted

## 2021-12-09 DIAGNOSIS — M47816 Spondylosis without myelopathy or radiculopathy, lumbar region: Secondary | ICD-10-CM

## 2021-12-09 NOTE — Telephone Encounter (Signed)
Post procedure call;  no questions or concerns.  ?Patient states he is having trouble with his lower back and hips.  States he is having a difficult time with this and would like this to be addressed.  Next appt 01/05/22 for PPE f/up.  Patient states that is a while off.  Explained that we have to give this procedure 2 weeks before Dr Holley Raring would want to do anything else with steroids.  Patient still not satisfied with this answer.  I told patient I would let [facility] know his issues and if he wanted to see him any sooner I would let him know.  ?

## 2021-12-09 NOTE — Telephone Encounter (Signed)
Called patient and changed his appt to Hayden Hall for 01-05-22 at 1pm.  ?Patient asks if dr Holley Raring will order xrays. Please call patient and advise. ?

## 2021-12-14 ENCOUNTER — Ambulatory Visit
Admission: RE | Admit: 2021-12-14 | Discharge: 2021-12-14 | Disposition: A | Discharge status: Home / Self Care | Payer: Medicare Other | Source: Ambulatory Visit | Attending: Physician Assistant | Admitting: Physician Assistant

## 2021-12-14 ENCOUNTER — Ambulatory Visit
Admission: RE | Admit: 2021-12-14 | Discharge: 2021-12-14 | Disposition: A | Discharge status: Home / Self Care | Payer: Medicare Other | Source: Ambulatory Visit | Attending: Student in an Organized Health Care Education/Training Program | Admitting: Student in an Organized Health Care Education/Training Program

## 2021-12-14 DIAGNOSIS — M48061 Spinal stenosis, lumbar region without neurogenic claudication: Secondary | ICD-10-CM | POA: Diagnosis not present

## 2021-12-14 DIAGNOSIS — M47816 Spondylosis without myelopathy or radiculopathy, lumbar region: Secondary | ICD-10-CM | POA: Diagnosis not present

## 2021-12-14 DIAGNOSIS — M533 Sacrococcygeal disorders, not elsewhere classified: Secondary | ICD-10-CM | POA: Diagnosis not present

## 2021-12-14 DIAGNOSIS — M1612 Unilateral primary osteoarthritis, left hip: Secondary | ICD-10-CM | POA: Insufficient documentation

## 2021-12-14 DIAGNOSIS — Z9181 History of falling: Secondary | ICD-10-CM

## 2021-12-14 DIAGNOSIS — J449 Chronic obstructive pulmonary disease, unspecified: Secondary | ICD-10-CM | POA: Insufficient documentation

## 2021-12-14 DIAGNOSIS — R0781 Pleurodynia: Secondary | ICD-10-CM | POA: Insufficient documentation

## 2021-12-14 DIAGNOSIS — M545 Low back pain, unspecified: Secondary | ICD-10-CM | POA: Diagnosis not present

## 2021-12-14 DIAGNOSIS — F17210 Nicotine dependence, cigarettes, uncomplicated: Secondary | ICD-10-CM | POA: Insufficient documentation

## 2021-12-14 DIAGNOSIS — M4317 Spondylolisthesis, lumbosacral region: Secondary | ICD-10-CM | POA: Insufficient documentation

## 2021-12-14 DIAGNOSIS — G8929 Other chronic pain: Secondary | ICD-10-CM | POA: Diagnosis not present

## 2021-12-14 NOTE — Telephone Encounter (Signed)
Called patient to let him know that [facility] has ordered x-rays of his lower back.  Also, I noticed there were xrays ordered by Norlene Duel PA for intercostals on 11/25/21.  Instructed patient on how to go about getting those done.  Patient verbalizes u/o information.  ?

## 2022-01-05 ENCOUNTER — Telehealth: Payer: Self-pay

## 2022-01-05 ENCOUNTER — Ambulatory Visit
Payer: Medicare Other | Attending: Student in an Organized Health Care Education/Training Program | Admitting: Student in an Organized Health Care Education/Training Program

## 2022-01-05 ENCOUNTER — Encounter: Payer: Self-pay | Admitting: Student in an Organized Health Care Education/Training Program

## 2022-01-05 VITALS — BP 138/90 | HR 59 | Temp 97.1°F | Resp 16 | Ht 72.0 in | Wt 157.0 lb

## 2022-01-05 DIAGNOSIS — M47812 Spondylosis without myelopathy or radiculopathy, cervical region: Secondary | ICD-10-CM | POA: Diagnosis not present

## 2022-01-05 DIAGNOSIS — M67911 Unspecified disorder of synovium and tendon, right shoulder: Secondary | ICD-10-CM | POA: Diagnosis not present

## 2022-01-05 DIAGNOSIS — M19011 Primary osteoarthritis, right shoulder: Secondary | ICD-10-CM

## 2022-01-05 DIAGNOSIS — M542 Cervicalgia: Secondary | ICD-10-CM | POA: Diagnosis not present

## 2022-01-05 NOTE — Patient Instructions (Signed)
______________________________________________________________________ ? ?Preparing for your procedure (without sedation) ? ?Procedure appointments are limited to planned procedures: ?No Prescription Refills. ?No disability issues will be discussed. ?No medication changes will be discussed. ? ?Instructions: ?Oral Intake: Do not eat or drink anything for at least 6 hours prior to your procedure. (Exception: Blood Pressure Medication. See below.) ?Transportation: Unless otherwise stated by your physician, you may drive yourself after the procedure. ?Blood Pressure Medicine: Do not forget to take your blood pressure medicine with a sip of water the morning of the procedure. If your Diastolic (lower reading)is above 100 mmHg, elective cases will be cancelled/rescheduled. ?Blood thinners: These will need to be stopped for procedures. Notify our staff if you are taking any blood thinners. Depending on which one you take, there will be specific instructions on how and when to stop it. ?Diabetics on insulin: Notify the staff so that you can be scheduled 1st case in the morning. If your diabetes requires high dose insulin, take only ? of your normal insulin dose the morning of the procedure and notify the staff that you have done so. ?Preventing infections: Shower with an antibacterial soap the morning of your procedure.  ?Build-up your immune system: Take 1000 mg of Vitamin C with every meal (3 times a day) the day prior to your procedure. ?Antibiotics: Inform the staff if you have a condition or reason that requires you to take antibiotics before dental procedures. ?Pregnancy: If you are pregnant, call and cancel the procedure. ?Sickness: If you have a cold, fever, or any active infections, call and cancel the procedure. ?Arrival: You must be in the facility at least 30 minutes prior to your scheduled procedure. ?Children: Do not bring any children with you. ?Dress appropriately: Bring dark clothing that you would not mind  if they get stained. ?Valuables: Do not bring any jewelry or valuables. ? ?Reasons to call and reschedule or cancel your procedure: (Following these recommendations will minimize the risk of a serious complication.) ?Surgeries: Avoid having procedures within 2 weeks of any surgery. (Avoid for 2 weeks before or after any surgery). ?Flu Shots: Avoid having procedures within 2 weeks of a flu shots or . (Avoid for 2 weeks before or after immunizations). ?Barium: Avoid having a procedure within 7-10 days after having had a radiological study involving the use of radiological contrast. (Myelograms, Barium swallow or enema study). ?Heart attacks: Avoid any elective procedures or surgeries for the initial 6 months after a "Myocardial Infarction" (Heart Attack). ?Blood thinners: It is imperative that you stop these medications before procedures. Let us know if you if you take any blood thinner.  ?Infection: Avoid procedures during or within two weeks of an infection (including chest colds or gastrointestinal problems). Symptoms associated with infections include: Localized redness, fever, chills, night sweats or profuse sweating, burning sensation when voiding, cough, congestion, stuffiness, runny nose, sore throat, diarrhea, nausea, vomiting, cold or Flu symptoms, recent or current infections. It is specially important if the infection is over the area that we intend to treat. ?Heart and lung problems: Symptoms that may suggest an active cardiopulmonary problem include: cough, chest pain, breathing difficulties or shortness of breath, dizziness, ankle swelling, uncontrolled high or unusually low blood pressure, and/or palpitations. If you are experiencing any of these symptoms, cancel your procedure and contact your primary care physician for an evaluation. ? ?Remember:  ?Regular Business hours are:  ?Monday to Thursday 8:00 AM to 4:00 PM ? ?Provider's Schedule: ?Francisco Naveira, MD:  ?Procedure days: Tuesday and Thursday    7:30 AM to 4:00 PM ? ?Gillis Santa, MD:  ?Procedure days: Monday and Wednesday 7:30 AM to 4:00 PM ?______________________________________________________________________ ? Epidural Steroid Injection ?An epidural steroid injection is a shot of steroid medicine and numbing medicine that is given into the space between the spinal cord and the bones of the back (epidural space). The shot helps relieve pain caused by an irritated or swollen nerve root. ?The amount of pain relief you get from the injection depends on what is causing the nerve to be swollen and irritated, and how long your pain lasts. You are more likely to benefit from this injection if your pain is strong and comes on suddenly rather than if you have had long-term (chronic) pain. ?Tell a health care provider about: ?Any allergies you have. ?All medicines you are taking, including vitamins, herbs, eye drops, creams, and over-the-counter medicines. ?Any problems you or family members have had with anesthetic medicines. ?Any blood disorders you have. ?Any surgeries you have had. ?Any medical conditions you have. ?Whether you are pregnant or may be pregnant. ?What are the risks? ?Generally, this is a safe procedure. However, problems may occur, including: ?Headache. ?Bleeding. ?Infection. ?Allergic reaction to medicines. ?Nerve damage. ?What happens before the procedure? ?Staying hydrated ?Follow instructions from your health care provider about hydration, which may include: ?Up to 2 hours before the procedure - you may continue to drink clear liquids, such as water, clear fruit juice, black coffee, and plain tea. ?Eating and drinking restrictions ?Follow instructions from your health care provider about eating and drinking, which may include: ?8 hours before the procedure - stop eating heavy meals or foods, such as meat, fried foods, or fatty foods. ?6 hours before the procedure - stop eating light meals or foods, such as toast or cereal. ?6 hours before  the procedure - stop drinking milk or drinks that contain milk. ?2 hours before the procedure - stop drinking clear liquids. ?Medicines ?You may be given medicines to lower anxiety. ?Ask your health care provider about: ?Changing or stopping your regular medicines. This is especially important if you are taking diabetes medicines or blood thinners. ?Taking medicines such as aspirin and ibuprofen. These medicines can thin your blood. Do not take these medicines unless your health care provider tells you to take them. ?Taking over-the-counter medicines, vitamins, herbs, and supplements. ?General instructions ?Ask your health care provider what steps will be taken to prevent infection. ?Plan to have a responsible adult take you home from the hospital or clinic. ?If you will be going home right after the procedure, plan to have a responsible adult care for you for the time you are told. This is important. ?What happens during the procedure? ?An IV will be inserted into one of your veins. ?You will be given one or more of the following: ?A medicine to help you relax (sedative). ?A medicine to numb the area (local anesthetic). ?You will be asked to lie on your abdomen or sit. ?The injection site will be cleaned. ?A needle will be inserted through your skin into the epidural space. This may cause you some discomfort. An X-ray machine will be used to guide the needle as close as possible to the affected nerve. ?A steroid medicine and a local anesthetic will be injected into the epidural space. ?The needle and IV will be removed. ?A bandage (dressing) will be put over the injection site. ?The procedure may vary among health care providers and hospitals. ?What can I expect after the procedure? ?Your blood  pressure, heart rate, breathing rate, and blood oxygen level will be monitored until you leave the hospital or clinic. ?Your arm or leg may feel weak or numb for a few hours. ?The injection site may feel sore. ?Follow these  instructions at home: ?Injection site care ?You may remove the bandage (dressing) after 24 hours. ?Check your injection site every day for signs of infection. Check for: ?Redness, swelling, or pain. ?

## 2022-01-05 NOTE — Progress Notes (Signed)
PROVIDER NOTE: Information contained herein reflects review and annotations entered in association with encounter. Interpretation of such information and data should be left to medically-trained personnel. Information provided to patient can be located elsewhere in the medical record under "Patient Instructions". Document created using STT-dictation technology, any transcriptional errors that may result from process are unintentional.  ?  ?Patient: Hayden Hall  Service Category: E/M  Provider: Gillis Santa, MD  ?DOB: 1956/07/13  DOS: 01/05/2022  Specialty: Interventional Pain Management  ?MRN: [id number]  Setting: Ambulatory outpatient  PCP: Hayden Burrow, MD  ?Type: Established Patient    Referring Provider: Theotis Hall*  ?Location: Office  Delivery: Face-to-face    ? ?HPI  ?Hayden Hall, a 66 y.o. 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 Hayden Hall primary complain today is Neck Pain ?Last encounter: My last encounter with him was on 12/08/2021. ?Pertinent problems: Hayden Hall has Cervicalgia; DDD (degenerative disc disease), cervical; Cervico-occipital neuralgia; and Cervical facet joint syndrome on their pertinent problem list. ?Pain Assessment: Severity of Chronic pain is reported as a 7 /10. Location: Neck Right, Left/shoulders bilateral, down arm to elbow. Onset: More than a month ago. Quality: Aching, Discomfort. Timing: Constant. Modifying factor(s): meditation, exercise, throwing ball to door, pain , daily activites. ?Vitals:  height is 6' (1.829 m) and weight is 157 lb (71.2 kg). His temperature is 97.1 ?F (36.2 ?C) (abnormal). His blood pressure is 138/90 and his pulse is 59 (abnormal). His respiration is 16 and oxygen saturation is 99%.  ? ?Reason for encounter: post-procedure evaluation and assessment.  ? ? ?Post-procedure evaluation  ? ?Type: Cervical Facet Medial Branch Block(s) #2  ?Primary Purpose:  Therapeutic ?Region: Posterolateral cervical spine ?Level: C4, C5, C6, Medial Branch Level(s). Injecting these levels blocks the C4-5, C5-6,  cervical facet joints. ?Laterality: Bilateral Paraspinal ? ?Effectiveness:  ?Initial hour after procedure: 100 %  ?Subsequent 4-6 hours post-procedure: 100 %  ?Analgesia past initial 6 hours: 50 %  ?Ongoing improvement:  ?Analgesic:  75% on the left, pain returning on the right, currently getting 20-25% pain relief on right ?Function: Somewhat improved ?ROM: Somewhat improved ? ? ?ROS  ?Constitutional: Denies any fever or chills ?Gastrointestinal: No reported hemesis, hematochezia, vomiting, or acute GI distress ?Musculoskeletal:  right cervical spine pain, right shoulder, periscapular pain ?Neurological: No reported episodes of acute onset apraxia, aphasia, dysarthria, agnosia, amnesia, paralysis, loss of coordination, or loss of consciousness ? ?Medication Review  ?Milk Thistle, Turmeric Curcumin, Vitamin D, buPROPion, cyclobenzaprine, ibuprofen, levothyroxine, and valACYclovir ? ?History Review  ?Allergy: Hayden Hall is allergic to vesicare [solifenacin]. ?Drug: Hayden Hall  reports no history of drug use. ?Alcohol:  reports that he does not currently use alcohol. ?Tobacco:  reports that he has been smoking cigarettes and cigars. He has never used smokeless tobacco. ?Social: Hayden Hall  reports that he has been smoking cigarettes and cigars. He has never used smokeless tobacco. He reports that he does not currently use alcohol. He reports that he does not use drugs. ?Medical:  has a past medical history of Anxiety, Arthritis, Asthma, Bipolar disorder ([place]), BPH (benign prostatic hyperplasia), Bronchitis, Cataract, Chronic constipation, Chronic pain, COPD (chronic obstructive pulmonary disease) ([place]), Depression, Dry eye, GERD (gastroesophageal reflux disease), Headache, Hepatitis C, Herpes, History of pancreatitis, History of seizure disorder, Hypothyroidism, Memory  impairment, Myoclonic jerking, Neck injury, Nocturia, Plantar fasciitis, Pneumonia, Rotator cuff tear, Schizophrenia ([place]), SOB (shortness of breath), Trigger finger, Urinary frequency, and Wears glasses. ?  Surgical: Hayden Hall  has a past surgical history that includes Trigger finger release; Rotator cuff repair (Left); Rotator cuff repair (Right); Colonoscopy (2011); Shoulder arthroscopy (Left, 07/12/2018); Colonoscopy with propofol (N/A, 05/21/2020); and Esophagogastroduodenoscopy (egd) with propofol (N/A, 05/21/2020). ?Family: family history includes Aneurysm in his father; Bladder Cancer in his mother; Breast cancer in his mother; Cancer in his sister, sister, and sister; Heart Problems in his father and mother; Heart disease in an other family member; Kidney disease in his cousin and mother; Migraines in his mother, sister, and sister; Prostate cancer in his father; Stroke in his father and paternal grandfather. ? ?Laboratory Chemistry Profile  ? ?Renal ?Lab Results  ?Component Value Date  ? BUN 18 08/13/2021  ? CREATININE 0.86 08/13/2021  ? GFRAA >60 07/12/2018  ? GFRNONAA >60 08/13/2021  ?  Hepatic ?Lab Results  ?Component Value Date  ? AST 18 04/27/2018  ? ALT 13 04/27/2018  ? ALBUMIN 4.8 04/27/2018  ? ALKPHOS 46 04/27/2018  ? LIPASE 107 (H) 05/17/2017  ?  ?Electrolytes ?Lab Results  ?Component Value Date  ? NA 136 08/13/2021  ? K 3.9 08/13/2021  ? CL 107 08/13/2021  ? CALCIUM 9.4 08/13/2021  ?  Bone ?No results found for: [place], [id number], [id number], PY1950DT2, 25OHVITD1, 25OHVITD2, 25OHVITD3, TESTOFREE, TESTOSTERONE  ?Inflammation (CRP: Acute Phase) (ESR: Chronic Phase) ?No results found for: CRP, ESRSEDRATE, LATICACIDVEN    ?  ? ?Note: Above Lab results reviewed. ? ?Recent Imaging Review  ?DG Lumbar Spine Complete W/Bend ?CLINICAL DATA:  Lower back pain, chronic. ? ?EXAM: ?LUMBAR SPINE - COMPLETE WITH BENDING VIEWS ? ?COMPARISON:  None ? ?FINDINGS: ?There are 5 non-rib-bearing lumbar-type vertebral  bodies. There is ?1.2 cm high grade 1 to low grade 2 anterolisthesis of L5 on S1 with ?chronic bilateral L5 pars defects. This is unchanged on neutral, ?flexion and extension view. ? ?Vertebral body heights are maintained. Moderate L5-S1 disc space ?narrowing. ? ?IMPRESSION:: ?IMPRESSION: ?1. High grade 1 to low grade 2 anterolisthesis of L5 on S1 unchanged ?on dynamic views. Chronic bilateral L5 pars defects. ?2. Moderate L5-S1 disc space narrowing. ? ?Electronically Signed ?  By: Yvonne Kendall M.D. ?  On: 12/15/2021 17:52 ?DG Ribs Unilateral Right ?CLINICAL DATA:  Fall 3-4 weeks ago. Rib pain. Generalized right rib ?pain and left lower posterior rib pain. ? ?EXAM: ?LEFT RIBS AND CHEST - 3+ VIEW; RIGHT RIBS - 2 VIEW ? ?COMPARISON:  Chest radiographs 08/13/2021 ? ?FINDINGS: ?Cardiac silhouette and mediastinal contours are unchanged and within ?normal limits with mild calcification again seen within aortic arch. ?There is moderate hyperinflation. There is mild tenting of the ?slightly lateral aspect of the left hemidiaphragm however no left ?basilar lung opacity is seen. Bilateral lungs appear clear. Cystic ?lucencies consistent with chronic emphysematous change. ? ?A marker overlies the left abdomen just inferior to the ribs. No ?left rib acute displaced fracture is identified. ? ?Right ribs: ? ?No acute displaced rib fracture is identified. ? ?IMPRESSION:: ?IMPRESSION: ?1. No acute displaced rib fracture is seen on either side. ?2. No pneumothorax. ?3. Hyperinflation suggesting COPD.  No acute lung process. ? ?Electronically Signed ?  By: Yvonne Kendall M.D. ?  On: 12/15/2021 17:49 ?DG Ribs Unilateral W/Chest Left ?CLINICAL DATA:  Fall 3-4 weeks ago. Rib pain. Generalized right rib ?pain and left lower posterior rib pain. ? ?EXAM: ?LEFT RIBS AND CHEST - 3+ VIEW; RIGHT RIBS - 2 VIEW ? ?COMPARISON:  Chest radiographs 08/13/2021 ? ?FINDINGS: ?Cardiac silhouette and mediastinal contours are unchanged  and within ?normal  limits with mild calcification again seen within aortic arch. ?There is moderate hyperinflation. There is mild tenting of the ?slightly lateral aspect of the left hemidiaphragm however no left ?basilar lung opacity is seen. Bi

## 2022-01-05 NOTE — Telephone Encounter (Signed)
LM for patient to call office regarding virtual visit questions.   ?

## 2022-01-05 NOTE — Progress Notes (Signed)
Safety precautions to be maintained throughout the outpatient stay will include: orient to surroundings, keep bed in low position, maintain call bell within reach at all times, provide assistance with transfer out of bed and ambulation.  

## 2022-01-12 ENCOUNTER — Ambulatory Visit (HOSPITAL_BASED_OUTPATIENT_CLINIC_OR_DEPARTMENT_OTHER): Payer: Medicare Other | Admitting: Student in an Organized Health Care Education/Training Program

## 2022-01-12 ENCOUNTER — Ambulatory Visit
Admission: RE | Admit: 2022-01-12 | Discharge: 2022-01-12 | Disposition: A | Discharge status: Home / Self Care | Payer: Medicare Other | Source: Ambulatory Visit | Attending: Student in an Organized Health Care Education/Training Program | Admitting: Student in an Organized Health Care Education/Training Program

## 2022-01-12 ENCOUNTER — Encounter: Payer: Self-pay | Admitting: Student in an Organized Health Care Education/Training Program

## 2022-01-12 VITALS — BP 129/87 | HR 72 | Temp 97.6°F | Resp 16 | Ht 72.0 in | Wt 157.0 lb

## 2022-01-12 DIAGNOSIS — M19011 Primary osteoarthritis, right shoulder: Secondary | ICD-10-CM | POA: Diagnosis present

## 2022-01-12 DIAGNOSIS — M47892 Other spondylosis, cervical region: Secondary | ICD-10-CM | POA: Diagnosis not present

## 2022-01-12 DIAGNOSIS — M47812 Spondylosis without myelopathy or radiculopathy, cervical region: Secondary | ICD-10-CM

## 2022-01-12 DIAGNOSIS — Z9889 Other specified postprocedural states: Secondary | ICD-10-CM | POA: Diagnosis not present

## 2022-01-12 DIAGNOSIS — M25511 Pain in right shoulder: Secondary | ICD-10-CM | POA: Insufficient documentation

## 2022-01-12 DIAGNOSIS — M67911 Unspecified disorder of synovium and tendon, right shoulder: Secondary | ICD-10-CM | POA: Diagnosis present

## 2022-01-12 DIAGNOSIS — M67813 Other specified disorders of tendon, right shoulder: Secondary | ICD-10-CM | POA: Insufficient documentation

## 2022-01-12 DIAGNOSIS — Z7952 Long term (current) use of systemic steroids: Secondary | ICD-10-CM | POA: Diagnosis not present

## 2022-01-12 MED ORDER — IOHEXOL 180 MG/ML  SOLN
10.0000 mL | Freq: Once | INTRAMUSCULAR | Status: AC
  Administered 2022-01-12: 10 mL via INTRA_ARTICULAR
  Filled 2022-01-12: qty 20

## 2022-01-12 MED ORDER — LIDOCAINE HCL 2 % IJ SOLN
20.0000 mL | Freq: Once | INTRAMUSCULAR | Status: AC
  Administered 2022-01-12: 400 mg
  Filled 2022-01-12: qty 20

## 2022-01-12 MED ORDER — DEXAMETHASONE SODIUM PHOSPHATE 10 MG/ML IJ SOLN
10.0000 mg | Freq: Once | INTRAMUSCULAR | Status: AC
  Administered 2022-01-12: 10 mg
  Filled 2022-01-12: qty 1

## 2022-01-12 MED ORDER — ROPIVACAINE HCL 2 MG/ML IJ SOLN
9.0000 mL | Freq: Once | INTRAMUSCULAR | Status: AC
  Administered 2022-01-12: 9 mL via PERINEURAL
  Filled 2022-01-12: qty 20

## 2022-01-12 NOTE — Progress Notes (Signed)
Patient's Name: Hayden Hall  MRN: [id number]  ?Referring Provider: Theotis Hall*  DOB: 1956/05/27  ?PCP: Hayden Hall  DOS: 01/12/2022  ?Note by: Hayden Santa, Hall  Service setting: Ambulatory outpatient  ?Specialty: Interventional Pain Management  Patient type: Established  ?Location: [facility] (AMB) Pain Management Facility  Visit type: Interventional Procedure  ? ?Primary Reason for Visit: Interventional Pain Management Treatment. ?CC: Neck Pain ? ?Procedure:       Anesthesia, Analgesia, Anxiolysis:  ?Type: Cervical Facet Medial Branch Block(s) #3  ?Primary Purpose: Therapeutic ?Region: Posterolateral cervical spine ?Level: C4, C5, C6, Medial Branch Level(s). Injecting these levels blocks the C4-5, C5-6,  cervical facet joints. ?Laterality: Right-Sided Paraspinal  Type: Local Anesthesia ?Indication(s): Analgesia         ?Route: Infiltration (Hessmer/IM) ?IV Access: Declined ?Sedation: None since NPO instructions were not followed  ?Local Anesthetic: Lidocaine 1-2%  ? ?Indications: ?1. Cervical facet joint syndrome   ?2. Cervical spondylosis   ?3. Tendinopathy of right rotator cuff   ?4. Primary osteoarthritis of right shoulder   ? ? ?Pain Score: ?Pre-procedure: 7 /10 ?Post-procedure: 3  (Moving arm and neck putting on clothes)/10 ? ?Pre-op Assessment:  ?Hayden Hall is a 66 y.o. (year old), male patient, seen today for interventional treatment. He  has a past surgical history that includes Trigger finger release; Rotator cuff repair (Left); Rotator cuff repair (Right); Colonoscopy (2011); Shoulder arthroscopy (Left, 07/12/2018); Colonoscopy with propofol (N/A, 05/21/2020); and Esophagogastroduodenoscopy (egd) with propofol (N/A, 05/21/2020). Hayden Hall has a current medication list which includes the following prescription(s): bupropion, cyclobenzaprine, ibuprofen, levothyroxine, vitamin d, milk thistle, turmeric curcumin, and valacyclovir. His primarily concern today is the Neck  Pain ? ?Initial Vital Signs:  ?Pulse/HCG Rate: 70  ?Temp: 97.6 ?F (36.4 ?C) ?Resp: 16 ?BP: 124/84 ?SpO2: 100 % ? ?BMI: Estimated body mass index is 21.29 kg/m? as calculated from the following: ?  Height as of this encounter: 6' (1.829 m). ?  Weight as of this encounter: 157 lb (71.2 kg). ? ?Risk Assessment: ?Allergies: Reviewed. He is allergic to vesicare [solifenacin].  ?Allergy Precautions: None required ?Coagulopathies: Reviewed. None identified.  ?Blood-thinner therapy: None at this time ?Active Infection(s): Reviewed. None identified. Hayden Hall is afebrile ? ?Site Confirmation: Hayden Hall was asked to confirm the procedure and laterality before marking the site ?Procedure checklist: Completed ?Consent: Before the procedure and under the influence of no sedative(s), amnesic(s), or anxiolytics, the patient was informed of the treatment options, risks and possible complications. To fulfill our ethical and legal obligations, as recommended by the American Medical Association's Code of Ethics, I have informed the patient of my clinical impression; the nature and purpose of the treatment or procedure; the risks, benefits, and possible complications of the intervention; the alternatives, including doing nothing; the risk(s) and benefit(s) of the alternative treatment(s) or procedure(s); and the risk(s) and benefit(s) of doing nothing. ?The patient was provided information about the general risks and possible complications associated with the procedure. These may include, but are not limited to: failure to achieve desired goals, infection, bleeding, organ or nerve damage, allergic reactions, paralysis, and death. ?In addition, the patient was informed of those risks and complications associated to Spine-related procedures, such as failure to decrease pain; infection (i.e.: Meningitis, epidural or intraspinal abscess); bleeding (i.e.: epidural hematoma, subarachnoid hemorrhage, or any other type of intraspinal or  peri-dural bleeding); organ or nerve damage (i.e.: Any type of peripheral nerve, nerve root, or spinal cord injury) with subsequent damage to sensory,  motor, and/or autonomic systems, resulting in permanent pain, numbness, and/or weakness of one or several areas of the body; allergic reactions; (i.e.: anaphylactic reaction); and/or death. ?Furthermore, the patient was informed of those risks and complications associated with the medications. These include, but are not limited to: allergic reactions (i.e.: anaphylactic or anaphylactoid reaction(s)); adrenal axis suppression; blood sugar elevation that in diabetics may result in ketoacidosis or comma; water retention that in patients with history of congestive heart failure may result in shortness of breath, pulmonary edema, and decompensation with resultant heart failure; weight gain; swelling or edema; medication-induced neural toxicity; particulate matter embolism and blood vessel occlusion with resultant organ, and/or nervous system infarction; and/or aseptic necrosis of one or more joints. ?Finally, the patient was informed that Medicine is not an exact science; therefore, there is also the possibility of unforeseen or unpredictable risks and/or possible complications that may result in a catastrophic outcome. The patient indicated having understood very clearly. We have given the patient no guarantees and we have made no promises. Enough time was given to the patient to ask questions, all of which were answered to the patient's satisfaction. Hayden Hall has indicated that he wanted to continue with the procedure. ?Attestation: I, the ordering provider, attest that I have discussed with the patient the benefits, risks, side-effects, alternatives, likelihood of achieving goals, and potential problems during recovery for the procedure that I have provided informed consent. ?Date  Time: 01/12/2022  8:15 AM ? ?Pre-Procedure Preparation:  ?Monitoring: As per clinic  protocol. Respiration, ETCO2, SpO2, BP, heart rate and rhythm monitor placed and checked for adequate function ?Safety Precautions: Patient was assessed for positional comfort and pressure points before starting the procedure. ?Time-out: I initiated and conducted the "Time-out" before starting the procedure, as per protocol. The patient was asked to participate by confirming the accuracy of the "Time Out" information. Verification of the correct person, site, and procedure were performed and confirmed by me, the nursing staff, and the patient. "Time-out" conducted as per Joint Commission's Universal Protocol ([email or web address].01.01.01). ?Time: 2585 ? ?Description of Procedure:       ?Position: Prone with head of the table raised to facilitate breathing. ?Laterality: Right ?Level:  C4, C5, C6, Medial Branch Level(s). ?Area Prepped: Posterior Cervico-thoracic Region ?Prepping solution: ChloraPrep (2% chlorhexidine gluconate and 70% isopropyl alcohol) ?Safety Precautions: Aspiration looking for blood return was conducted prior to all injections. At no point did we inject any substances, as a needle was being advanced. Before injecting, the patient was told to immediately notify me if he was experiencing any new onset of "ringing in the ears, or metallic taste in the mouth". No attempts were made at seeking any paresthesias. Safe injection practices and needle disposal techniques used. Medications properly checked for expiration dates. SDV (single dose vial) medications used. After the completion of the procedure, all disposable equipment used was discarded in the proper designated medical waste containers. ?Local Anesthesia: Protocol guidelines were followed. The patient was positioned over the fluoroscopy table. The area was prepped in the usual manner. The time-out was completed. The target area was identified using fluoroscopy. A 12-in long, straight, sterile hemostat was used with fluoroscopic guidance to locate the targets for  each level blocked. Once located, the skin was marked with an approved surgical skin marker. Once all sites were marked, the skin (epidermis, dermis, and hypodermis), as well as deeper tissues (fat, connective tissue an

## 2022-01-12 NOTE — Progress Notes (Signed)
Safety precautions to be maintained throughout the outpatient stay will include: orient to surroundings, keep bed in low position, maintain call bell within reach at all times, provide assistance with transfer out of bed and ambulation.  

## 2022-01-12 NOTE — Progress Notes (Signed)
PROVIDER NOTE: Interpretation of information contained herein should be left to medically-trained personnel. Specific patient instructions are provided elsewhere under "Patient Instructions" section of medical record. This document was created in part using STT-dictation technology, any transcriptional errors that may result from this process are unintentional.  ?Patient: Hayden Hall ?Type: Established ?DOB: Apr 10, 1956 ?MRN: [id number] ?PCP: Theotis Burrow, MD  Service: Procedure ?DOS: 01/12/2022 ?Setting: Ambulatory ?Location: Ambulatory outpatient facility ?Delivery: Face-to-face Provider: Gillis Santa, MD ?Specialty: Interventional Pain Management ?Specialty designation: 09 ?Location: Outpatient facility ?Ref. Prov.: Revelo, Elyse Jarvis*   ? ?Primary Reason for Visit: Interventional Pain Management Treatment. ?CC: Neck Pain ? ?Procedure:          ? Type: Suprascapular nerve block #1  ?Laterality:  Right ?Level: Superior to scapular spine, lateral to supraspinatus fossa (Suprascapular notch).  ?Imaging: Fluoroscopic guidance ?Anesthesia: Local anesthesia (1-2% Lidocaine) ?Anxiolysis: None                 ?Sedation: None. ?DOS: 01/12/2022  ?Performed by: Gillis Santa, MD ? ?Purpose: Diagnostic/Therapeutic ?Indications: Shoulder pain, severe enough to impact quality of life and/or function. ?Right shoulder OA ?Right rotator cuff dysfunction ? ? ?NAS-11 score:  ? Pre-procedure: 7 /10  ? Post-procedure: 3  (Moving arm and neck putting on clothes)/10  ? ?Target: Suprascapular nerve ?Location: midway between the medial border of the scapula and the acromion as it runs through the suprascapular notch. ?Region: Suprascapular, posterior shoulder  ?Approach: Percutaneous  ?Neuroanatomy: The suprascapular nerve is the lateral branch of the superior trunk of the brachial plexus. It receives nerve fibers that originate in the nerve roots C5 and C6 (and sometimes C4). It is a mixed nerve, meaning that it  provides both sensory and motor supply for the suprascapular region. ?Function: The main function of this nerve is to provide motor innervation for two muscles, the supraspinatus and infraspinatus muscles. They are part of the rotator cuff muscles. In addition, the suprascapular nerve provides a sensory supply to the joints of the scapula (glenohumeral and acromioclavicular joints). ?Rationale (medical necessity): procedure needed and proper for the diagnosis and/or treatment of the patient's medical symptoms and needs. ? ?Position / Prep / Materials:  ?Position: Prone ?Materials:  ?Tray: Block ?Needle(s):  ?Type: Spinal  ?Gauge (G): 25  ?Length: 3.5 in.  ?Qty: 1 ?Prep solution: DuraPrep (Iodine Povacrylex [0.7% available iodine] and Isopropyl Alcohol, 74% w/w) ?Prep Area: Entire posterior shoulder area. From upper spine to shoulder proper (upper arm), and from lateral neck to lower tip of shoulder blade.  ? ?Pre-op H&P Assessment:  ?Mr. Braver is a 66 y.o. (year old), male patient, seen today for interventional treatment. He  has a past surgical history that includes Trigger finger release; Rotator cuff repair (Left); Rotator cuff repair (Right); Colonoscopy (2011); Shoulder arthroscopy (Left, 07/12/2018); Colonoscopy with propofol (N/A, 05/21/2020); and Esophagogastroduodenoscopy (egd) with propofol (N/A, 05/21/2020). Mr. Gonzaga has a current medication list which includes the following prescription(s): bupropion, cyclobenzaprine, ibuprofen, levothyroxine, vitamin d, milk thistle, turmeric curcumin, and valacyclovir. His primarily concern today is the Neck Pain ? ?Initial Vital Signs:  ?Pulse/HCG Rate: 70  ?Temp: 97.6 ?F (36.4 ?C) ?Resp: 16 ?BP: 124/84 ?SpO2: 100 % ? ?BMI: Estimated body mass index is 21.29 kg/m? as calculated from the following: ?  Height as of this encounter: 6' (1.829 m). ?  Weight as of this encounter: 157 lb (71.2 kg). ? ?Risk Assessment: ?Allergies: Reviewed. He is allergic to vesicare  [solifenacin].  ?Allergy Precautions: None required ?Coagulopathies: Reviewed.  None identified.  ?Blood-thinner therapy: None at this time ?Active Infection(s): Reviewed. None identified. Mr. Schuneman is afebrile ? ?Site Confirmation: Mr. Hausen was asked to confirm the procedure and laterality before marking the site ?Procedure checklist: Completed ?Consent: Before the procedure and under the influence of no sedative(s), amnesic(s), or anxiolytics, the patient was informed of the treatment options, risks and possible complications. To fulfill our ethical and legal obligations, as recommended by the American Medical Association's Code of Ethics, I have informed the patient of my clinical impression; the nature and purpose of the treatment or procedure; the risks, benefits, and possible complications of the intervention; the alternatives, including doing nothing; the risk(s) and benefit(s) of the alternative treatment(s) or procedure(s); and the risk(s) and benefit(s) of doing nothing. ?The patient was provided information about the general risks and possible complications associated with the procedure. These may include, but are not limited to: failure to achieve desired goals, infection, bleeding, organ or nerve damage, allergic reactions, paralysis, and death. ?In addition, the patient was informed of those risks and complications associated to the procedure, such as failure to decrease pain; infection; bleeding; organ or nerve damage with subsequent damage to sensory, motor, and/or autonomic systems, resulting in permanent pain, numbness, and/or weakness of one or several areas of the body; allergic reactions; (i.e.: anaphylactic reaction); and/or death. ?Furthermore, the patient was informed of those risks and complications associated with the medications. These include, but are not limited to: allergic reactions (i.e.: anaphylactic or anaphylactoid reaction(s)); adrenal axis suppression; blood sugar elevation  that in diabetics may result in ketoacidosis or comma; water retention that in patients with history of congestive heart failure may result in shortness of breath, pulmonary edema, and decompensation with resultant heart failure; weight gain; swelling or edema; medication-induced neural toxicity; particulate matter embolism and blood vessel occlusion with resultant organ, and/or nervous system infarction; and/or aseptic necrosis of one or more joints. ?Finally, the patient was informed that Medicine is not an exact science; therefore, there is also the possibility of unforeseen or unpredictable risks and/or possible complications that may result in a catastrophic outcome. The patient indicated having understood very clearly. We have given the patient no guarantees and we have made no promises. Enough time was given to the patient to ask questions, all of which were answered to the patient's satisfaction. Mr. Bellerose has indicated that he wanted to continue with the procedure. ?Attestation: I, the ordering provider, attest that I have discussed with the patient the benefits, risks, side-effects, alternatives, likelihood of achieving goals, and potential problems during recovery for the procedure that I have provided informed consent. ?Date  Time: 01/12/2022  8:15 AM ? ?Pre-Procedure Preparation:  ?Monitoring: As per clinic protocol. Respiration, ETCO2, SpO2, BP, heart rate and rhythm monitor placed and checked for adequate function ?Safety Precautions: Patient was assessed for positional comfort and pressure points before starting the procedure. ?Time-out: I initiated and conducted the "Time-out" before starting the procedure, as per protocol. The patient was asked to participate by confirming the accuracy of the "Time Out" information. Verification of the correct person, site, and procedure were performed and confirmed by me, the nursing staff, and the patient. "Time-out" conducted as per Joint Commission's  Universal Protocol (UP.01.01.01). ?Time: 5809 ? ?Description of Procedure:          ?Procedural Technique Safety Precautions: Aspiration looking for blood return was conducted prior to all injections. At no point did we

## 2022-01-12 NOTE — Patient Instructions (Signed)

## 2022-01-13 ENCOUNTER — Telehealth: Payer: Self-pay

## 2022-01-13 NOTE — Telephone Encounter (Signed)
Post procedure follow up..  Patient states he is doing good 

## 2022-01-31 ENCOUNTER — Ambulatory Visit
Payer: Medicare Other | Attending: Student in an Organized Health Care Education/Training Program | Admitting: Student in an Organized Health Care Education/Training Program

## 2022-01-31 ENCOUNTER — Encounter: Payer: Self-pay | Admitting: Student in an Organized Health Care Education/Training Program

## 2022-01-31 VITALS — BP 117/85 | HR 59 | Temp 97.6°F | Resp 16 | Ht 72.0 in | Wt 150.0 lb

## 2022-01-31 DIAGNOSIS — M19011 Primary osteoarthritis, right shoulder: Secondary | ICD-10-CM | POA: Insufficient documentation

## 2022-01-31 DIAGNOSIS — M67911 Unspecified disorder of synovium and tendon, right shoulder: Secondary | ICD-10-CM | POA: Insufficient documentation

## 2022-01-31 DIAGNOSIS — M47812 Spondylosis without myelopathy or radiculopathy, cervical region: Secondary | ICD-10-CM | POA: Diagnosis not present

## 2022-01-31 DIAGNOSIS — M5416 Radiculopathy, lumbar region: Secondary | ICD-10-CM | POA: Diagnosis not present

## 2022-01-31 MED ORDER — GABAPENTIN 100 MG PO CAPS: 100.0000 mg | ORAL_CAPSULE | Freq: Every day | ORAL | 1 refills | Status: DC

## 2022-01-31 NOTE — Progress Notes (Signed)
PROVIDER NOTE: Information contained herein reflects review and annotations entered in association with encounter. Interpretation of such information and data should be left to medically-trained personnel. Information provided to patient can be located elsewhere in the medical record under "Patient Instructions". Document created using STT-dictation technology, any transcriptional errors that may result from process are unintentional.  ?  ?Patient: Hayden Hall  Service Category: E/M  Provider: Gillis Santa, MD  ?DOB: [birth year]  DOS: 01/31/2022  Specialty: Interventional Pain Management  ?MRN: [id number]  Setting: Ambulatory outpatient  PCP: Theotis Burrow, MD  ?Type: Established Patient    Referring Provider: Theotis Burrow*  ?Location: Office  Delivery: Face-to-face    ? ?HPI  ?Mr. Hayden Hall, a 66 y.o. year old male, is here today because of his Lumbar radiculopathy [M54.16]. Mr. Hayden Hall primary complain today is Hip Pain (Bilateral, right is worse) and Neck Pain ?Last encounter: My last encounter with him was on 01/12/2022. ?Pertinent problems: Mr. Hayden Hall has Cervicalgia; DDD (degenerative disc disease), cervical; Cervico-occipital neuralgia; and Cervical facet joint syndrome on their pertinent problem list. ?Pain Assessment: Severity of Chronic pain is reported as a 7 /10. Location: Hip Right, Left/radiates into buttocks and down the inside of right leg to foot. Onset: More than a month ago. Quality: Sharp, Stabbing. Timing: Intermittent. Modifying factor(s): meditation, movement. ?Vitals:  height is 6' (1.829 m) and weight is 150 lb (68 kg). His temperature is 97.6 ?F (36.4 ?C). His blood pressure is 117/85 and his pulse is 59 (abnormal). His respiration is 16 and oxygen saturation is 98%.  ? ?Reason for encounter: post-procedure evaluation and assessment.   ? ? ?Post-procedure evaluation  ? Type: Suprascapular nerve block #1  ?Laterality:  Right ?Level: Superior to scapular  spine, lateral to supraspinatus fossa (Suprascapular notch).  ?Imaging: Fluoroscopic guidance ?Anesthesia: Local anesthesia (1-2% Lidocaine) ?Anxiolysis: None                 ?Sedation: None. ?DOS: 01/12/2022  ?Performed by: Gillis Santa, MD ? ?Purpose: Diagnostic/Therapeutic ?Indications: Shoulder pain, severe enough to impact quality of life and/or function. ?Right shoulder OA ?Right rotator cuff dysfunction ? ? ?NAS-11 score:  ? Pre-procedure: 7 /10  ? Post-procedure: 3  (Moving arm and neck putting on clothes)/10  ?   ?Effectiveness:  ?Initial hour after procedure: 100 %  ?Subsequent 4-6 hours post-procedure: 100 %  ?Analgesia past initial 6 hours: 75 % (lasting 3 weeks)  ?Ongoing improvement:  ?Analgesic:  70-75% ?Function: Mr. Hayden Hall reports improvement in function ?ROM: Mr. Hayden Hall reports improvement in ROM ?Patient is seeing improvement in his right shoulder and right cervical spine pain after his intervention above.  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  Recommend lumbar epidural steroid injection as below. ? ? ?ROS  ?Constitutional: Denies any fever or chills ?Gastrointestinal: No reported hemesis, hematochezia, vomiting, or acute GI distress ?Musculoskeletal:  Low back pain with radiation to bilateral lower extremity right greater than left in a dermatomal fashion ?Neurological: No reported episodes of acute onset apraxia, aphasia, dysarthria, agnosia, amnesia, paralysis, loss of coordination, or loss of consciousness ? ?Medication Review  ?buPROPion, cyclobenzaprine, gabapentin, ibuprofen, and levothyroxine ? ?History Review  ?Allergy: Mr. Hayden Hall is allergic to vesicare [solifenacin]. ?Drug: Mr. Hayden Hall  reports no history of drug use. ?Alcohol:  reports that he does not currently use alcohol. ?Tobacco:  reports that he has been  smoking cigarettes and cigars. He has  never used smokeless tobacco. ?Social: Mr. Hayden Hall  reports that he has been smoking cigarettes and cigars. He has never used smokeless tobacco. He reports that he does not currently use alcohol. He reports that he does not use drugs. ?Medical:  has a past medical history of Anxiety, Arthritis, Asthma, Bipolar disorder ([place]), BPH (benign prostatic hyperplasia), Bronchitis, Cataract, Chronic constipation, Chronic pain, COPD (chronic obstructive pulmonary disease) ([place]), Depression, Dry eye, GERD (gastroesophageal reflux disease), Headache, Hepatitis C, Herpes, History of pancreatitis, History of seizure disorder, Hypothyroidism, Memory impairment, Myoclonic jerking, Neck injury, Nocturia, Plantar fasciitis, Pneumonia, Rotator cuff tear, Schizophrenia ([place]), SOB (shortness of breath), Trigger finger, Urinary frequency, and Wears glasses. ?Surgical: Mr. Hayden Hall  has a past surgical history that includes Trigger finger release; Rotator cuff repair (Left); Rotator cuff repair (Right); Colonoscopy (2011); Shoulder arthroscopy (Left, 07/12/2018); Colonoscopy with propofol (N/A, 05/21/2020); and Esophagogastroduodenoscopy (egd) with propofol (N/A, 05/21/2020). ?Family: family history includes Aneurysm in his father; Bladder Cancer in his mother; Breast cancer in his mother; Cancer in his sister, sister, and sister; Heart Problems in his father and mother; Heart disease in an other family member; Kidney disease in his cousin and mother; Migraines in his mother, sister, and sister; Prostate cancer in his father; Stroke in his father and paternal grandfather. ? ?Laboratory Chemistry Profile  ? ?Renal ?Lab Results  ?Component Value Date  ? BUN 18 08/13/2021  ? CREATININE 0.86 08/13/2021  ? GFRAA >60 07/12/2018  ? GFRNONAA >60 08/13/2021  ?  Hepatic ?Lab Results  ?Component Value Date  ? AST 18 04/27/2018  ? ALT 13 04/27/2018  ? ALBUMIN 4.8 04/27/2018  ? ALKPHOS 46 04/27/2018  ? LIPASE 107 (H) 05/17/2017  ?  ?Electrolytes ?Lab  Results  ?Component Value Date  ? NA 136 08/13/2021  ? K 3.9 08/13/2021  ? CL 107 08/13/2021  ? CALCIUM 9.4 08/13/2021  ?  Bone ?No results found for: [place], [id number], [id number], [id number], 25OHVITD1, 25OHVITD2, 25OHVITD3, TESTOFREE, TESTOSTERONE  ?Inflammation (CRP: Acute Phase) (ESR: Chronic Phase) ?No results found for: CRP, ESRSEDRATE, LATICACIDVEN    ?  ? ?Note: Above Lab results reviewed. ? ? ?Physical Exam  ?General appearance: Well nourished, well developed, and well hydrated. In no apparent acute distress ?Mental status: Alert, oriented x 3 (person, place, & time)       ?Respiratory: No evidence of acute respiratory distress ?Eyes: PERLA ?Vitals: BP 117/85   Pulse (!) 59   Temp 97.6 ?F (36.4 ?C)   Resp 16   Ht 6' (1.829 m)   Wt 150 lb (68 kg)   SpO2 98%   BMI 20.34 kg/m?  ?BMI: Estimated body mass index is 20.34 kg/m? as calculated from the following: ?  Height as of this encounter: 6' (1.829 m). ?  Weight as of this encounter: 150 lb (68 kg). ?Ideal: Ideal body weight: 77.6 kg (171 lb 1.2 oz) ? ?Cervical Spine Area Exam  ?Skin & Axial Inspection: No masses, redness, edema, swelling, or associated skin lesions ?Alignment: Symmetrical ?Functional ROM: Pain restricted ROM, to the right ?Stability: No instability detected ?Muscle Tone/Strength: Functionally intact. No obvious neuro-muscular anomalies detected. ?Sensory (Neurological): Musculoskeletal pain pattern ?Palpation: No palpable anomalies             ?Upper Extremity (UE) Exam    ?Side: Right upper extremity  Side: Left upper extremity  ?Skin & Extremity Inspection: Skin color, temperature, and hair growth are WNL. No peripheral edema or cyanosis. No masses, redness,  swelling, asymmetry, or associated skin lesions. No contractures.  Skin & Extremity Inspection: Skin color, temperature, and hair growth are WNL. No peripheral edema or cyanosis. No masses, redness, swelling, asymmetry, or associated skin lesions. No contractures.  ?Functional  ROM: Improved after treatment for shoulder and elbow  Functional ROM: Unrestricted ROM          ?Muscle Tone/Strength: Functionally intact. No obvious neuro-muscular anomalies detected.  Muscle Tone/Strength: [facility]

## 2022-01-31 NOTE — Assessment & Plan Note (Signed)
Status post 3 sets of diagnostic/therapeutic right C4, C5, C6 cervical facet medial branch nerve blocks.  Helpful for his cervical spine pain related to cervical facet arthropathy.  Future considerations include cervical facet medial branch radiofrequency ablation. ?

## 2022-01-31 NOTE — Patient Instructions (Signed)
Epidural Steroid Injection Patient Information  Description: The epidural space surrounds the nerves as they exit the spinal cord.  In some patients, the nerves can be compressed and inflamed by a bulging disc or a tight spinal canal (spinal stenosis).  By injecting steroids into the epidural space, we can bring irritated nerves into direct contact with a potentially helpful medication.  These steroids act directly on the irritated nerves and can reduce swelling and inflammation which often leads to decreased pain.  Epidural steroids may be injected anywhere along the spine and from the neck to the low back depending upon the location of your pain.   After numbing the skin with local anesthetic (like Novocaine), a small needle is passed into the epidural space slowly.  You may experience a sensation of pressure while this is being done.  The entire block usually last less than 10 minutes.  Conditions which may be treated by epidural steroids:  Low back and leg pain Neck and arm pain Spinal stenosis Post-laminectomy syndrome Herpes zoster (shingles) pain Pain from compression fractures  Preparation for the injection:  Do not eat any solid food or dairy products within 8 hours of your appointment.  You may drink clear liquids up to 3 hours before appointment.  Clear liquids include water, black coffee, juice or soda.  No milk or cream please. You may take your regular medication, including pain medications, with a sip of water before your appointment  Diabetics should hold regular insulin (if taken separately) and take 1/2 normal NPH dos the morning of the procedure.  Carry some sugar containing items with you to your appointment. A driver must accompany you and be prepared to drive you home after your procedure.  Bring all your current medications with your. An IV may be inserted and sedation may be given at the discretion of the physician.   A blood pressure cuff, EKG and other monitors will  often be applied during the procedure.  Some patients may need to have extra oxygen administered for a short period. You will be asked to provide medical information, including your allergies, prior to the procedure.  We must know immediately if you are taking blood thinners (like Coumadin/Warfarin)  Or if you are allergic to IV iodine contrast (dye). We must know if you could possible be pregnant.  Possible side-effects: Bleeding from needle site Infection (rare, may require surgery) Nerve injury (rare) Numbness & tingling (temporary) Difficulty urinating (rare, temporary) Spinal headache ( a headache worse with upright posture) Light -headedness (temporary) Pain at injection site (several days) Decreased blood pressure (temporary) Weakness in arm/leg (temporary) Pressure sensation in back/neck (temporary)  Call if you experience: Fever/chills associated with headache or increased back/neck pain. Headache worsened by an upright position. New onset weakness or numbness of an extremity below the injection site Hives or difficulty breathing (go to the emergency room) Inflammation or drainage at the infection site Severe back/neck pain Any new symptoms which are concerning to you  Please note:  Although the local anesthetic injected can often make your back or neck feel good for several hours after the injection, the pain will likely return.  It takes 3-7 days for steroids to work in the epidural space.  You may not notice any pain relief for at least that one week.  If effective, we will often do a series of three injections spaced 3-6 weeks apart to maximally decrease your pain.  After the initial series, we generally will wait several months before   considering a repeat injection of the same type.  If you have any questions, please call [phone] [facility] 

## 2022-01-31 NOTE — Assessment & Plan Note (Signed)
Provided patient with exercises that he could do for sciatica.  Also recommend addition of gabapentin.  Plan for lumbar epidural steroid injection at L4-L5. ? ?Orders Placed This Encounter  ?Procedures  ?? Lumbar Epidural Injection  ?  Standing Status:   Future  ?  Standing Expiration Date:   03/03/2022  ?  Scheduling Instructions:  ?   Procedure: Interlaminar Lumbar Epidural Steroid injection (LESI)         ?   Laterality: RIGHT L4/5  ?   Sedation: local  ?   Timeframe: ASAA  ?  Order Specific Question:   Where will this procedure be performed?  ?  Answer:   [facility]  ? ? ?

## 2022-01-31 NOTE — Assessment & Plan Note (Signed)
Status post right suprascapular nerve block which was helpful.  Consider radiofrequency ablation of the suprascapular nerve in the future given return of pain. ?

## 2022-01-31 NOTE — Progress Notes (Signed)
Safety precautions to be maintained throughout the outpatient stay will include: orient to surroundings, keep bed in low position, maintain call bell within reach at all times, provide assistance with transfer out of bed and ambulation.  

## 2022-02-07 ENCOUNTER — Ambulatory Visit
Admission: RE | Admit: 2022-02-07 | Discharge: 2022-02-07 | Disposition: A | Discharge status: Home / Self Care | Payer: Medicare Other | Source: Ambulatory Visit | Attending: Student in an Organized Health Care Education/Training Program | Admitting: Student in an Organized Health Care Education/Training Program

## 2022-02-07 ENCOUNTER — Encounter: Payer: Self-pay | Admitting: Student in an Organized Health Care Education/Training Program

## 2022-02-07 ENCOUNTER — Ambulatory Visit (HOSPITAL_BASED_OUTPATIENT_CLINIC_OR_DEPARTMENT_OTHER): Payer: Medicare Other | Admitting: Student in an Organized Health Care Education/Training Program

## 2022-02-07 VITALS — BP 113/88 | HR 60 | Temp 97.6°F | Resp 16 | Ht 72.0 in | Wt 150.0 lb

## 2022-02-07 DIAGNOSIS — M5416 Radiculopathy, lumbar region: Secondary | ICD-10-CM | POA: Insufficient documentation

## 2022-02-07 MED ORDER — DEXAMETHASONE SODIUM PHOSPHATE 10 MG/ML IJ SOLN
10.0000 mg | Freq: Once | INTRAMUSCULAR | Status: AC
  Administered 2022-02-07: 10 mg

## 2022-02-07 MED ORDER — LIDOCAINE HCL 2 % IJ SOLN
20.0000 mL | Freq: Once | INTRAMUSCULAR | Status: AC
  Administered 2022-02-07: 100 mg

## 2022-02-07 MED ORDER — IOHEXOL 180 MG/ML  SOLN
10.0000 mL | Freq: Once | INTRAMUSCULAR | Status: AC
  Administered 2022-02-07: 5 mL via EPIDURAL

## 2022-02-07 MED ORDER — ROPIVACAINE HCL 2 MG/ML IJ SOLN
2.0000 mL | Freq: Once | INTRAMUSCULAR | Status: AC
  Administered 2022-02-07: 2 mL via EPIDURAL

## 2022-02-07 MED ORDER — DEXAMETHASONE SODIUM PHOSPHATE 10 MG/ML IJ SOLN
INTRAMUSCULAR | Status: AC
Start: 2022-02-07 — End: ?
  Filled 2022-02-07: qty 1

## 2022-02-07 MED ORDER — ROPIVACAINE HCL 2 MG/ML IJ SOLN
INTRAMUSCULAR | Status: AC
  Filled 2022-02-07: qty 20

## 2022-02-07 MED ORDER — SODIUM CHLORIDE 0.9% FLUSH
2.0000 mL | Freq: Once | INTRAVENOUS | Status: AC
Start: 2022-02-07 — End: 2022-02-07
  Administered 2022-02-07: 2 mL

## 2022-02-07 MED ORDER — LIDOCAINE HCL (PF) 2 % IJ SOLN
INTRAMUSCULAR | Status: AC
  Filled 2022-02-07: qty 10

## 2022-02-07 MED ORDER — SODIUM CHLORIDE (PF) 0.9 % IJ SOLN
INTRAMUSCULAR | Status: AC
Start: 2022-02-07 — End: ?
  Filled 2022-02-07: qty 10

## 2022-02-07 NOTE — Progress Notes (Signed)
Safety precautions to be maintained throughout the outpatient stay will include: orient to surroundings, keep bed in low position, maintain call bell within reach at all times, provide assistance with transfer out of bed and ambulation.  

## 2022-02-07 NOTE — Patient Instructions (Addendum)
Radiofrequency Ablation Radiofrequency ablation is a procedure that is performed to relieve pain. The procedure is often used for back, neck, or arm pain. Radiofrequency ablation involves the use of a machine that creates radio waves to make heat. During the procedure, the heat is applied to the nerve that carries the pain signal. The heat damages the nerve and interferes with the pain signal. Pain relief usually starts about 2 weeks after the procedure and lasts for 6 months to 1 year. Tell a health care provider about: Any allergies you have. All medicines you are taking, including vitamins, herbs, eye drops, creams, and over-the-counter medicines. Any problems you or family members have had with anesthetic medicines. Any bleeding problems you have. Any surgeries you have had. Any medical conditions you have. Whether you are pregnant or may be pregnant. What are the risks? Generally, this is a safe procedure. However, problems may occur, including: Pain or soreness at the injection site. Allergic reaction to medicines given during the procedure. Bleeding. Infection at the injection site. Damage to nerves or blood vessels. What happens before the procedure? When to stop eating and drinking Follow instructions from your health care provider about what you may eat and drink before your procedure. These may include: 8 hours before the procedure Stop eating most foods. Do not eat meat, fried foods, or fatty foods. Eat only light foods, such as toast or crackers. All liquids are okay except energy drinks and alcohol. 6 hours before the procedure Stop eating. Drink only clear liquids, such as water, clear fruit juice, black coffee, plain tea, and sports drinks. Do not drink energy drinks or alcohol. 2 hours before the procedure Stop drinking all liquids. You may be allowed to take medicine with small sips of water. If you do not follow your health care provider's instructions, your  procedure may be delayed or canceled. Medicines Ask your health care provider about: Changing or stopping your regular medicines. This is especially important if you are taking diabetes medicines or blood thinners. Taking medicines such as aspirin and ibuprofen. These medicines can thin your blood. Do not take these medicines unless your health care provider tells you to take them. Taking over-the-counter medicines, vitamins, herbs, and supplements. General instructions Ask your health care provider what steps will be taken to help prevent infection. These steps may include: Removing hair at the procedure site. Washing skin with a germ-killing soap. Taking antibiotic medicine. If you will be going home right after the procedure, plan to have a responsible adult: Take you home from the hospital or clinic. You will not be allowed to drive. Care for you for the time you are told. What happens during the procedure?  You will be awake during the procedure. You will need to be able to talk with the health care provider during the procedure. An IV will be inserted into one of your veins. You will be given one or more of the following: A medicine to help you relax (sedative). A medicine to numb the area (local anesthetic). Your health care provider will insert a radiofrequency needle into the area to be treated. This is done with the help of fluoroscopy. A wire that carries the radio waves (electrode) will be put through the radiofrequency needle. An electrical pulse will be sent through the electrode to verify the correct nerve that is causing your pain. You will feel a tingling sensation, and you may have muscle twitching. The tissue around the needle tip will be heated by an   electric current that comes from the radiofrequency machine. This will numb the nerves. ?The needle will be removed. ?A bandage (dressing) will be put on the insertion area. ?The procedure may vary among health care providers  and hospitals. ?What happens after the procedure? ?Your blood pressure, heart rate, breathing rate, and blood oxygen level will be monitored until you leave the hospital or clinic. ?Return to your normal activities as told by your health care provider. Ask your health care provider what activities are safe for you. ?If you were given a sedative during the procedure, it can affect you for several hours. Do not drive or operate machinery until your health care provider says that it is safe. ?Summary ?Radiofrequency ablation is a procedure that is performed to relieve pain. The procedure is often used for back, neck, or arm pain. ?Radiofrequency ablation involves the use of a machine that creates radio waves to make heat. ?Plan to have a responsible adult take you home from the hospital or clinic. Do not drive or operate machinery until your health care provider says that it is safe. ?Return to your normal activities as told by your health care provider. Ask your health care provider what activities are safe for you. ?This information is not intended to replace advice given to you by your health care provider. Make sure you discuss any questions you have with your health care provider. ?Document Revised: 03/09/2021 Document Reviewed: 03/09/2021 ?Elsevier Patient Education ? [place]. ?Pain Management Discharge Instructions ? ?General Discharge Instructions : ? ?If you need to reach your doctor call: Monday-Friday 8:00 am - 4:00 pm at [phone] or toll free [phone].  After clinic hours [phone] to have operator reach doctor. ? ?Bring all of your medication bottles to all your appointments in the pain clinic. ? ?To cancel or reschedule your appointment with Pain Management please remember to call 24 hours in advance to avoid a fee. ? ?Refer to the educational materials which you have been given on: General Risks, I had my Procedure. Discharge Instructions, Post Sedation. ? ?Post Procedure  Instructions: ? ?The drugs you were given will stay in your system until tomorrow, so for the next 24 hours you should not drive, make any legal decisions or drink any alcoholic beverages. ? ?You may eat anything you prefer, but it is better to start with liquids then soups and crackers, and gradually work up to solid foods. ? ?Please notify your doctor immediately if you have any unusual bleeding, trouble breathing or pain that is not related to your normal pain. ? ?Depending on the type of procedure that was done, some parts of your body may feel week and/or numb.  This usually clears up by tonight or the next day. ? ?Walk with the use of an assistive device or accompanied by an adult for the 24 hours. ? ?You may use ice on the affected area for the first 24 hours.  Put ice in a Ziploc bag and cover with a towel and place against area 15 minutes on 15 minutes off.  You may switch to heat after 24 hours.Epidural Steroid Injection ?Patient Information ? ?Description: The epidural space surrounds the nerves as they exit the spinal cord.  In some patients, the nerves can be compressed and inflamed by a bulging disc or a tight spinal canal (spinal stenosis).  By injecting steroids into the epidural space, we can bring irritated nerves into direct contact with a potentially helpful medication.  These steroids act directly on  the irritated nerves and can reduce swelling and inflammation which often leads to decreased pain.  Epidural steroids may be injected anywhere along the spine and from the neck to the low back depending upon the location of your pain. ?  After numbing the skin with local anesthetic (like Novocaine), a small needle is passed into the epidural space slowly.  You may experience a sensation of pressure while this is being done.  The entire block usually last less than 10 minutes. ? ?Conditions which may be treated by epidural steroids: ? ?Low back and leg pain ?Neck and arm pain ?Spinal  stenosis ?Post-laminectomy syndrome ?Herpes zoster (shingles) pain ?Pain from compression fractures ? ?Preparation for the injection: ? ?Do not eat any solid food or dairy products within 8 hours of your appointment.  ?You may drink

## 2022-02-07 NOTE — Progress Notes (Signed)
PROVIDER NOTE: Interpretation of information contained herein should be left to medically-trained personnel. Specific patient instructions are provided elsewhere under "Patient Instructions" section of medical record. This document was created in part using STT-dictation technology, any transcriptional errors that may result from this process are unintentional.  ?Patient: Hayden Hall ?Type: Established ?DOB: 29-May-1956 ?MRN: [id number] ?PCP: Hayden Burrow, MD  Service: Procedure ?DOS: 02/07/2022 ?Setting: Ambulatory ?Location: Ambulatory outpatient facility ?Delivery: Face-to-face Provider: Gillis Santa, MD ?Specialty: Interventional Pain Management ?Specialty designation: 09 ?Location: Outpatient facility ?Ref. Prov.: Hayden Hall*   ? ?Primary Reason for Visit: Interventional Pain Management Treatment. ?CC: Back Pain (lower) ?  ?Procedure:          ? Type: Lumbar epidural steroid injection (LESI) (interlaminar) #1    ?Laterality: Right   ?Level:  L4-5 Level.  ?Imaging: Fluoroscopic guidance ?Anesthesia: Local anesthesia (1-2% Lidocaine) ?Anxiolysis: None                 ?Sedation: None. ?DOS: 02/07/2022  ?Performed by: Gillis Santa, MD ? ?Purpose: Diagnostic/Therapeutic ?Indications: Lumbar radicular pain of intraspinal etiology of more than 4 weeks that has failed to respond to conservative therapy and is severe enough to impact quality of life or function. ?1. Lumbar radiculopathy   ? ?NAS-11 Pain score:  ? Pre-procedure: 10-Worst pain ever/10  ? Post-procedure: 3 /10  ? ?  ? ?Position / Prep / Materials:  ?Position: Prone w/ head of the table raised (slight reverse trendelenburg) to facilitate breathing.  ?Prep solution: DuraPrep (Iodine Povacrylex [0.7% available iodine] and Isopropyl Alcohol, 74% w/w) ?Prep Area: Entire Posterior Lumbar Region from lower scapular tip down to mid buttocks area and from flank to flank. ?Materials:  ?Tray: Epidural tray ?Needle(s):  ?Type: Epidural  needle          ?Gauge (G):  22 ?Length: Regular (3.5-in) ?Qty: 1 ? ?Pre-op H&P Assessment:  ?Hayden Hall is a 66 y.o. (year old), male patient, seen today for interventional treatment. He  has a past surgical history that includes Trigger finger release; Rotator cuff repair (Left); Rotator cuff repair (Right); Colonoscopy (2011); Shoulder arthroscopy (Left, 07/12/2018); Colonoscopy with propofol (N/A, 05/21/2020); and Esophagogastroduodenoscopy (egd) with propofol (N/A, 05/21/2020). Hayden Hall has a current medication list which includes the following prescription(s): bupropion, gabapentin, ibuprofen, levothyroxine, and cyclobenzaprine. His primarily concern today is the Back Pain (lower) ? ?Initial Vital Signs:  ?Pulse/HCG Rate: 60ECG Heart Rate: 60 ?Temp: 97.6 ?F (36.4 ?C) ?Resp: 16 ?BP: 125/85 ?SpO2: 99 % ? ?BMI: Estimated body mass index is 20.34 kg/m? as calculated from the following: ?  Height as of this encounter: 6' (1.829 m). ?  Weight as of this encounter: 150 lb (68 kg). ? ?Risk Assessment: ?Allergies: Reviewed. He is allergic to vesicare [solifenacin].  ?Allergy Precautions: None required ?Coagulopathies: Reviewed. None identified.  ?Blood-thinner therapy: None at this time ?Active Infection(s): Reviewed. None identified. Hayden Hall is afebrile ? ?Site Confirmation: Hayden Hall was asked to confirm the procedure and laterality before marking the site ?Procedure checklist: Completed ?Consent: Before the procedure and under the influence of no sedative(s), amnesic(s), or anxiolytics, the patient was informed of the treatment options, risks and possible complications. To fulfill our ethical and legal obligations, as recommended by the [employer] Association's Code of Ethics, I have informed the patient of my clinical impression; the nature and purpose of the treatment or procedure; the risks, benefits, and possible complications of the intervention; the alternatives, including doing nothing; the  risk(s) and benefit(s) of  the alternative treatment(s) or procedure(s); and the risk(s) and benefit(s) of doing nothing. ?The patient was provided information about the general risks and possible complications associated with the procedure. These may include, but are not limited to: failure to achieve desired goals, infection, bleeding, organ or nerve damage, allergic reactions, paralysis, and death. ?In addition, the patient was informed of those risks and complications associated to Spine-related procedures, such as failure to decrease pain; infection (i.e.: Meningitis, epidural or intraspinal abscess); bleeding (i.e.: epidural hematoma, subarachnoid hemorrhage, or any other type of intraspinal or peri-dural bleeding); organ or nerve damage (i.e.: Any type of peripheral nerve, nerve root, or spinal cord injury) with subsequent damage to sensory, motor, and/or autonomic systems, resulting in permanent pain, numbness, and/or weakness of one or several areas of the body; allergic reactions; (i.e.: anaphylactic reaction); and/or death. ?Furthermore, the patient was informed of those risks and complications associated with the medications. These include, but are not limited to: allergic reactions (i.e.: anaphylactic or anaphylactoid reaction(s)); adrenal axis suppression; blood sugar elevation that in diabetics may result in ketoacidosis or comma; water retention that in patients with history of congestive heart failure may result in shortness of breath, pulmonary edema, and decompensation with resultant heart failure; weight gain; swelling or edema; medication-induced neural toxicity; particulate matter embolism and blood vessel occlusion with resultant organ, and/or nervous system infarction; and/or aseptic necrosis of one or more joints. ?Finally, the patient was informed that Medicine is not an exact science; therefore, there is also the possibility of unforeseen or unpredictable risks and/or possible complications  that may result in a catastrophic outcome. The patient indicated having understood very clearly. We have given the patient no guarantees and we have made no promises. Enough time was given to the patient to ask questions, all of which were answered to the patient's satisfaction. Mr. Vetrano has indicated that he wanted to continue with the procedure. ?Attestation: I, the ordering provider, attest that I have discussed with the patient the benefits, risks, side-effects, alternatives, likelihood of achieving goals, and potential problems during recovery for the procedure that I have provided informed consent. ?Date  Time: 02/07/2022  8:48 AM ? ?Pre-Procedure Preparation:  ?Monitoring: As per clinic protocol. Respiration, ETCO2, SpO2, BP, heart rate and rhythm monitor placed and checked for adequate function ?Safety Precautions: Patient was assessed for positional comfort and pressure points before starting the procedure. ?Time-out: I initiated and conducted the "Time-out" before starting the procedure, as per protocol. The patient was asked to participate by confirming the accuracy of the "Time Out" information. Verification of the correct person, site, and procedure were performed and confirmed by me, the nursing staff, and the patient. "Time-out" conducted as per Joint Commission's Universal Protocol (UP.01.01.01). ?Time: 0916 ? ?Description/Narrative of Procedure:          ?Target: Epidural space via interlaminar opening, initially targeting the lower laminar border of the superior vertebral body. ?Region: Lumbar ?Approach: Percutaneous paravertebral ? ?Rationale (medical necessity): procedure needed and proper for the diagnosis and/or treatment of the patient's medical symptoms and needs. ?Procedural Technique Safety Precautions: Aspiration looking for blood return was conducted prior to all injections. At no point did we inject any substances, as a needle was being advanced. No attempts were made at seeking any  paresthesias. Safe injection practices and needle disposal techniques used. Medications properly checked for expiration dates. SDV (single dose vial) medications used. ?Description of the Procedure: Protocol gu

## 2022-02-08 ENCOUNTER — Telehealth: Payer: Self-pay | Admitting: *Deleted

## 2022-02-08 NOTE — Telephone Encounter (Signed)
Post procedure call;  patient reports he is doing well.  Patient wanting to talk a lot about his neck.  Have explained that BL would need to go over this with him. Patient states that he thinks the nurses know as much as the Dr but we are not allowed to talk to him about it.  I told him that there are many things that as a nurse that we can discuss but at the end of the day BL is doing his procedure and will be the best one to address his concerns.  ?

## 2022-02-21 ENCOUNTER — Ambulatory Visit: Payer: Self-pay | Admitting: Student in an Organized Health Care Education/Training Program

## 2022-02-22 ENCOUNTER — Telehealth: Payer: Medicare Other | Admitting: Student in an Organized Health Care Education/Training Program

## 2022-03-03 ENCOUNTER — Encounter: Payer: Self-pay | Admitting: Student in an Organized Health Care Education/Training Program

## 2022-03-03 ENCOUNTER — Ambulatory Visit
Payer: Medicare Other | Attending: Student in an Organized Health Care Education/Training Program | Admitting: Student in an Organized Health Care Education/Training Program

## 2022-03-03 VITALS — BP 155/85 | HR 54 | Temp 97.2°F | Resp 16 | Ht 72.0 in | Wt 157.0 lb

## 2022-03-03 DIAGNOSIS — M47812 Spondylosis without myelopathy or radiculopathy, cervical region: Secondary | ICD-10-CM | POA: Insufficient documentation

## 2022-03-03 DIAGNOSIS — G894 Chronic pain syndrome: Secondary | ICD-10-CM | POA: Insufficient documentation

## 2022-03-03 DIAGNOSIS — M5481 Occipital neuralgia: Secondary | ICD-10-CM | POA: Insufficient documentation

## 2022-03-03 MED ORDER — OXYCODONE-ACETAMINOPHEN 5-325 MG PO TABS: 1.0000 | ORAL_TABLET | Freq: Every evening | ORAL | 0 refills | Status: AC | PRN

## 2022-03-03 NOTE — Progress Notes (Signed)
PROVIDER NOTE: Information contained herein reflects review and annotations entered in association with encounter. Interpretation of such information and data should be left to medically-trained personnel. Information provided to patient can be located elsewhere in the medical record under "Patient Instructions". Document created using STT-dictation technology, any transcriptional errors that may result from process are unintentional.    Patient: Hayden Hall  Service Category: E/M  Provider: Gillis Santa, MD  DOB: 1956-01-11  DOS: 03/03/2022  Specialty: Interventional Pain Management  MRN: [id number]  Setting: Ambulatory outpatient  PCP: Theotis Burrow, MD  Type: Established Patient    Referring Provider: Theotis Burrow*  Location: Office  Delivery: Face-to-face     HPI  Hayden Hall, a 66 y.o. 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 Hayden Hall primary complain today is Neck Pain and Shoulder Pain (bilateral) Last encounter: My last encounter with him was on 02/21/2022. Pertinent problems: Hayden Hall has Cervicalgia; DDD (degenerative disc disease), cervical; Cervico-occipital neuralgia; and Cervical facet joint syndrome on their pertinent problem list. Pain Assessment: Severity of Chronic pain is reported as a 7 /10. Location: Neck  /denies. Onset: More than a month ago. Quality: Other (Comment) (aggravating). Timing: Constant. Modifying factor(s): working. Vitals:  height is 6' (1.829 m) and weight is 157 lb (71.2 kg). His temporal temperature is 97.2 F (36.2 C) (abnormal). His blood pressure is 155/85 (abnormal) and his pulse is 54 (abnormal). His respiration is 16 and oxygen saturation is 100%.   Reason for encounter: follow-up evaluation.  Hayden Hall presents with increased right-sided neck pain as well as right occipital pain consistent with cervical facet joint syndrome and right cervical occipital  neuralgia. He has pain with right cervical rotation.  He tries to do home cervical spine stretching exercises but this has not been helpful for the occipital component or his right cervical spine pain. Of note he did get good benefit after his previous lumbar epidural steroid injection for his low back and radiating leg pain. We discussed suprascapular nerve RFA as well as peripheral nerve stimulation of the suprascapular nerve for persistent right shoulder pain.  He states that he will think about both of these treatment modalities further but would like to address his right neck and right occipital pain first. Requesting small quantity of oxycodone to assist with his occipital and cervical spine pain and sleep.  Pharmacotherapy Assessment  Analgesic:  we will renew urine toxicology screen today and initiate oxycodone 5 mg nightly as needed for pain  Monitoring: [place] PMP: PDMP not reviewed this encounter.       Pharmacotherapy: No side-effects or adverse reactions reported. Compliance: No problems identified. Effectiveness: Clinically acceptable.  No notes on file  UDS:  Summary  Date Value Ref Range Status  03/22/2018 FINAL  Final    Comment:    ==================================================================== TOXASSURE SELECT 13 (MW) ==================================================================== Specimen Alert Note:  Urinary creatinine is low; ability to detect some drugs may be compromised.  Interpret results with caution. ==================================================================== Test                             Result       Flag       Units   NO DRUGS DETECTED. ==================================================================== Test                      Result    Flag   Units  Ref Range   Creatinine              13        L      mg/dL      >=20 ==================================================================== Declared Medications:  Medication list  was not provided. ==================================================================== For clinical consultation, please call [phone]. ====================================================================      ROS  Constitutional: Denies any fever or chills Gastrointestinal: No reported hemesis, hematochezia, vomiting, or acute GI distress Musculoskeletal:  Right cervical spine pain overlying C2-C5, right occipital pain Neurological: No reported episodes of acute onset apraxia, aphasia, dysarthria, agnosia, amnesia, paralysis, loss of coordination, or loss of consciousness   Recent Imaging Review  DG Chest 2 View CLINICAL DATA:  Chest pain   EXAM: CHEST - 2 VIEW   COMPARISON:  07/21/2021   FINDINGS: Hyperinflation with mild bronchitic change. No focal opacity or pleural effusion. Normal cardiomediastinal silhouette. No pneumothorax.   IMPRESSION: Mild bronchitic changes.  No focal opacity.   Electronically Signed   By: Donavan Foil M.D.   On: 08/13/2021 18:01 Note: Reviewed         CLINICAL DATA:  Eval neurological compression.   EXAM: MRI CERVICAL SPINE WITHOUT CONTRAST   TECHNIQUE: Multiplanar, multisequence MR imaging of the cervical spine was performed. No intravenous contrast was administered.   COMPARISON:  12/28/2017 MRI cervical spine.   FINDINGS: Alignment: Straightening of cervical lordosis. Minimal grade 1 C3-4 and C7-T1 anterolisthesis.   Vertebrae: Normal bone marrow signal intensity. No focal osseous lesion.   Cord: Normal signal and morphology.   Posterior Fossa, vertebral arteries: Negative.   Disc levels: Multilevel osteophytosis and desiccation. Mild C3-4 and moderate C4-7 disc space loss.   C2-3: Small central protrusion and bilateral facet degenerative spurring. No significant spinal canal or neural foraminal narrowing.   C3-4: Disc osteophyte complex with superimposed right subarticular protrusion and right predominant  uncovertebral/facet hypertrophy. Severe right neural foraminal narrowing is unchanged. No significant spinal canal or left neural foraminal narrowing.   C4-5: Disc osteophyte complex with superimposed right paracentral protrusion abutting the ventral cord, uncovertebral and bilateral facet hypertrophy. Mild spinal canal and moderate bilateral neural foraminal narrowing, grossly unchanged.   C5-6: Disc osteophyte complex with small superimposed right paracentral protrusion abutting the ventral cord with bilateral uncovertebral and facet hypertrophy. Moderate spinal canal and bilateral neural foraminal narrowing, grossly unchanged.   C6-7: Disc osteophyte complex with superimposed central protrusion abutting the ventral cord, uncovertebral and bilateral facet hypertrophy. Mild spinal canal and bilateral neural foraminal narrowing.   C7-T1: Tiny central protrusion with bilateral facet degenerative spurring. No significant spinal canal or neural foraminal narrowing.   Paraspinal tissues: Within normal limits.   IMPRESSION: Multilevel spondylosis, overall unchanged since 2019.   Moderate spinal canal and bilateral neural foraminal narrowing at the C5-6 level, unchanged.   Severe right C3-4 and moderate bilateral C4-5 neural foraminal narrowing, unchanged.    Medication Review  buPROPion, cyclobenzaprine, gabapentin, ibuprofen, levothyroxine, and oxyCODONE-acetaminophen  History Review  Allergy: Hayden Hall is allergic to vesicare [solifenacin]. Drug: Hayden Hall  reports no history of drug use. Alcohol:  reports that he does not currently use alcohol. Tobacco:  reports that he has been smoking cigarettes and cigars. He has never used smokeless tobacco. Social: Hayden Hall  reports that he has been smoking cigarettes and cigars. He has never used smokeless tobacco. He reports that he does not currently use alcohol. He reports that he does not use drugs. Medical:  has a past  medical history of Anxiety, Arthritis, Asthma, Bipolar disorder ([place]), BPH (benign prostatic hyperplasia), Bronchitis, Cataract, Chronic constipation, Chronic pain, COPD (chronic obstructive pulmonary disease) ([place]), Depression, Dry eye, GERD (gastroesophageal reflux disease), Headache, Hepatitis C, Herpes, History of pancreatitis, History of seizure disorder, Hypothyroidism, Memory impairment, Myoclonic jerking, Neck injury, Nocturia, Plantar fasciitis, Pneumonia, Rotator cuff tear, Schizophrenia ([place]), SOB (shortness of breath), Trigger finger, Urinary frequency, and Wears glasses. Surgical: Hayden Hall  has a past surgical history that includes Trigger finger release; Rotator cuff repair (Left); Rotator cuff repair (Right); Colonoscopy (2011); Shoulder arthroscopy (Left, 07/12/2018); Colonoscopy with propofol (N/A, 05/21/2020); and Esophagogastroduodenoscopy (egd) with propofol (N/A, 05/21/2020). Family: family history includes Aneurysm in his father; Bladder Cancer in his mother; Breast cancer in his mother; Cancer in his sister, sister, and sister; Heart Problems in his father and mother; Heart disease in an other family member; Kidney disease in his cousin and mother; Migraines in his mother, sister, and sister; Prostate cancer in his father; Stroke in his father and paternal grandfather.  Laboratory Chemistry Profile   Renal Lab Results  Component Value Date   BUN 18 08/13/2021   CREATININE 0.86 08/13/2021   GFRAA >60 07/12/2018   GFRNONAA >60 08/13/2021    Hepatic Lab Results  Component Value Date   AST 18 04/27/2018   ALT 13 04/27/2018   ALBUMIN 4.8 04/27/2018   ALKPHOS 46 04/27/2018   LIPASE 107 (H) 05/17/2017    Electrolytes Lab Results  Component Value Date   NA 136 08/13/2021   K 3.9 08/13/2021   CL 107 08/13/2021   CALCIUM 9.4 08/13/2021    Bone No results found for: VD25OH, VD125OH2TOT, PI9518AC1, YS0630ZS0, 25OHVITD1, 25OHVITD2, 25OHVITD3, TESTOFREE, TESTOSTERONE   Inflammation (CRP: Acute Phase) (ESR: Chronic Phase) No results found for: CRP, ESRSEDRATE, LATICACIDVEN       Note: Above Lab results reviewed.   Physical Exam  General appearance: Well nourished, well developed, and well hydrated. In no apparent acute distress Mental status: Alert, oriented x 3 (person, place, & time)       Respiratory: No evidence of acute respiratory distress Eyes: PERLA Vitals: BP (!) 155/85   Pulse (!) 54   Temp (!) 97.2 F (36.2 C) (Temporal)   Resp 16   Ht 6' (1.829 m)   Wt 157 lb (71.2 kg)   SpO2 100%   BMI 21.29 kg/m  BMI: Estimated body mass index is 21.29 kg/m as calculated from the following:   Height as of this encounter: 6' (1.829 m).   Weight as of this encounter: 157 lb (71.2 kg). Ideal: Ideal body weight: 77.6 kg (171 lb 1.2 oz)    Cervical Spine Area Exam  Skin & Axial Inspection: No masses, redness, edema, swelling, or associated skin lesions Alignment: Symmetrical Functional ROM: Pain restricted ROM, bilaterally Stability: No instability detected Muscle Tone/Strength: Functionally intact. No obvious neuro-muscular anomalies detected. Sensory (Neurological): Arthropathic arthralgia Palpation: Complains of area being tender to palpation             Upper Extremity (UE) Exam      Side: Right upper extremity   Side: Left upper extremity  Skin & Extremity Inspection: Skin color, temperature, and hair growth are WNL. No peripheral edema or cyanosis. No masses, redness, swelling, asymmetry, or associated skin lesions. No contractures.   Skin & Extremity Inspection: Skin color, temperature, and hair growth are WNL. No peripheral edema or cyanosis. No masses, redness, swelling, asymmetry, or associated skin lesions. No contractures.  Functional ROM: Unrestricted ROM  Functional ROM: Unrestricted ROM          Muscle Tone/Strength: Functionally intact. No obvious neuro-muscular anomalies detected.   Muscle Tone/Strength: Functionally  intact. No obvious neuro-muscular anomalies detected.  Sensory (Neurological): Unimpaired           Sensory (Neurological): Unimpaired          Palpation: No palpable anomalies               Palpation: No palpable anomalies              Provocative Test(s):  Phalen's test: deferred Tinel's test: deferred Apley's scratch test (touch opposite shoulder):  Action 1 (Across chest): deferred Action 2 (Overhead): deferred Action 3 (LB reach): deferred     Provocative Test(s):  Phalen's test: deferred Tinel's test: deferred Apley's scratch test (touch opposite shoulder):  Action 1 (Across chest): deferred Action 2 (Overhead): deferred Action 3 (LB reach): deferred      Assessment   Diagnosis Status  1. Cervical facet joint syndrome   2. Cervical spondylosis   3. Cervico-occipital neuralgia   4. Chronic pain syndrome    Having a Flare-up Persistent Having a Flare-up     Plan of Care   Hayden Hall has a current medication list which includes the following long-term medication(s): gabapentin, levothyroxine, and bupropion.   1. Cervical facet joint syndrome - CERVICAL FACET (MEDIAL BRANCH NERVE BLOCK) ; Future - oxyCODONE-acetaminophen (PERCOCET) 5-325 MG tablet; Take 1 tablet by mouth at bedtime as needed for severe pain.  Dispense: 30 tablet; Refill: 0  2. Cervical spondylosis - CERVICAL FACET (MEDIAL BRANCH NERVE BLOCK) ; Future - oxyCODONE-acetaminophen (PERCOCET) 5-325 MG tablet; Take 1 tablet by mouth at bedtime as needed for severe pain.  Dispense: 30 tablet; Refill: 0  3. Cervico-occipital neuralgia - GREATER OCCIPITAL NERVE BLOCK; Future - oxyCODONE-acetaminophen (PERCOCET) 5-325 MG tablet; Take 1 tablet by mouth at bedtime as needed for severe pain.  Dispense: 30 tablet; Refill: 0  4. Chronic pain syndrome - ToxASSURE Select 13 (MW), Urine - CERVICAL FACET (MEDIAL BRANCH NERVE BLOCK) ; Future - GREATER OCCIPITAL NERVE BLOCK; Future -  oxyCODONE-acetaminophen (PERCOCET) 5-325 MG tablet; Take 1 tablet by mouth at bedtime as needed for severe pain.  Dispense: 30 tablet; Refill: 0     Pharmacotherapy (Medications Ordered): Meds ordered this encounter  Medications   oxyCODONE-acetaminophen (PERCOCET) 5-325 MG tablet    Sig: Take 1 tablet by mouth at bedtime as needed for severe pain.    Dispense:  30 tablet    Refill:  0    Chronic Pain: STOP Act (Not applicable) Fill 1 day early if closed on refill date. Avoid benzodiazepines within 8 hours of opioids   Orders:  Orders Placed This Encounter  Procedures   CERVICAL FACET (MEDIAL BRANCH NERVE BLOCK)     Standing Status:   Future    Standing Expiration Date:   04/02/2022    Scheduling Instructions:     Side: Right-sided     Level: C3-4, C4-5, C5-6 Facet joints (C3, C4, C5, C6, Medial Branch Nerves)     Sedation: Patient's choice.     Timeframe: ASAP    Order Specific Question:   Where will this procedure be performed?    Answer:   [facility]   GREATER OCCIPITAL NERVE BLOCK    Standing Status:   Future    Standing Expiration Date:   06/03/2022    Scheduling Instructions:     Procedure: Occipital nerve  block     RIght    Order Specific Question:   Where will this procedure be performed?    Answer:   [facility]   ToxASSURE Select 13 (MW), Urine    Volume: 30 ml(s). Minimum 3 ml of urine is needed. Document temperature of fresh sample. Indications: Long term (current) use of opiate analgesic (K46.286)    Order Specific Question:   Release to patient    Answer:   Immediate   Follow-up plan:   Return in about 1 week (around 03/10/2022) for R Cervical FCT MBNB + R Occipital NB, in clinic NS.     Consider pulsed radiofrequency ablation of the right suprascapular nerve versus Sprint peripheral nerve stimulation.   Recent Visits Date Type Provider Dept  02/07/22 Procedure visit Gillis Santa, MD [facility]  01/31/22 Office Visit Gillis Santa, MD [facility]  01/12/22 Procedure visit Gillis Santa, MD [facility]  01/05/22 Office Visit Gillis Santa, MD [facility]  12/08/21 Procedure visit Gillis Santa, MD [facility]  Showing recent visits within past 90 days and meeting all other requirements Today's Visits Date Type Provider Dept  03/03/22 Office Visit Gillis Santa, MD [facility]  Showing today's visits and meeting all other requirements Future Appointments No visits were found meeting these conditions. Showing future appointments within next 90 days and meeting all other requirements  I discussed the assessment and treatment plan with the patient. The patient was provided an opportunity to ask questions and all were answered. The patient agreed with the plan and demonstrated an understanding of the instructions.  Patient advised to call back or seek an in-person evaluation if the symptoms or condition worsens.  Duration of encounter: 35 minutes.  Note by: Gillis Santa, MD Date: 03/03/2022; Time: 11:09 AM

## 2022-03-03 NOTE — Patient Instructions (Signed)
______________________________________________________________________  Preparing for your procedure (without sedation)  Procedure appointments are limited to planned procedures: No Prescription Refills. No disability issues will be discussed. No medication changes will be discussed.  Instructions: Food Intake: Avoid eating anything for at least 4 hours prior to your procedure. Transportation: Unless otherwise stated by your physician, bring a driver. Morning Medicines: Take all of your scheduled morning medications. If you take heart medicine, except for blood thinners, do not forget to take it the morning of the procedure. If your Diastolic (lower reading) is above 100 mmHg, elective cases will be cancelled/rescheduled. Blood thinners: These will need to be stopped for procedures. Notify our staff if you are taking any blood thinners. Depending on which one you take, there will be specific instructions on how and when to stop it. Diabetics on insulin: Notify the staff so that you can be scheduled 1st case in the morning. If your diabetes requires high dose insulin, take only  of your normal insulin dose the morning of the procedure and notify the staff that you have done so. Preventing infections: Shower with an antibacterial soap the morning of your procedure.  Build-up your immune system: Take 1000 mg of Vitamin C with every meal (3 times a day) the day prior to your procedure. Antibiotics: Inform the staff if you have a condition or reason that requires you to take antibiotics before dental procedures. Pregnancy: If you are pregnant, call and cancel the procedure. Sickness: If you have a cold, fever, or any active infections, call and cancel the procedure. Arrival: You must be in the facility at least 30 minutes prior to your scheduled procedure. Children: Do not bring any children with you. Dress appropriately: There is always a possibility that your clothing may get soiled. Valuables:  Do not bring any jewelry or valuables.  Reasons to call and reschedule or cancel your procedure: (Following these recommendations will minimize the risk of a serious complication.) Surgeries: Avoid having procedures within 2 weeks of any surgery. (Avoid for 2 weeks before or after any surgery). Flu Shots: Avoid having procedures within 2 weeks of a flu shots or . (Avoid for 2 weeks before or after immunizations). Barium: Avoid having a procedure within 7-10 days after having had a radiological study involving the use of radiological contrast. (Myelograms, Barium swallow or enema study). Heart attacks: Avoid any elective procedures or surgeries for the initial 6 months after a "Myocardial Infarction" (Heart Attack). Blood thinners: It is imperative that you stop these medications before procedures. Let us know if you if you take any blood thinner.  Infection: Avoid procedures during or within two weeks of an infection (including chest colds or gastrointestinal problems). Symptoms associated with infections include: Localized redness, fever, chills, night sweats or profuse sweating, burning sensation when voiding, cough, congestion, stuffiness, runny nose, sore throat, diarrhea, nausea, vomiting, cold or Flu symptoms, recent or current infections. It is specially important if the infection is over the area that we intend to treat. Heart and lung problems: Symptoms that may suggest an active cardiopulmonary problem include: cough, chest pain, breathing difficulties or shortness of breath, dizziness, ankle swelling, uncontrolled high or unusually low blood pressure, and/or palpitations. If you are experiencing any of these symptoms, cancel your procedure and contact your primary care physician for an evaluation.  Remember:  Regular Business hours are:  Monday to Thursday 8:00 AM to 4:00 PM  Provider's Schedule: Milinda Pointer, MD:  Procedure days: Tuesday and Thursday 7:30 AM to 4:00 PM  Bilal  Lateef, MD:  Procedure days: Monday and Wednesday 7:30 AM to 4:00 PM ______________________________________________________________________   

## 2022-03-08 LAB — TOXASSURE SELECT 13 (MW), URINE

## 2022-03-09 ENCOUNTER — Ambulatory Visit
Payer: Medicare Other | Attending: Student in an Organized Health Care Education/Training Program | Admitting: Student in an Organized Health Care Education/Training Program

## 2022-03-09 DIAGNOSIS — M47812 Spondylosis without myelopathy or radiculopathy, cervical region: Secondary | ICD-10-CM | POA: Diagnosis not present

## 2022-03-09 DIAGNOSIS — G894 Chronic pain syndrome: Secondary | ICD-10-CM | POA: Diagnosis not present

## 2022-03-09 DIAGNOSIS — M5481 Occipital neuralgia: Secondary | ICD-10-CM | POA: Insufficient documentation

## 2022-03-09 NOTE — Progress Notes (Signed)
Patient here today for procedure.  Unfortunately patient is positive for cocaine on his last urine.  When asked by Dr Holley Raring he admitted to using again this morning.  Procedure cancelled.  Patient is asked if he needs some assistance or help with rehabilitation.  Patient denies.    Pills counted by myself and Louann Liv RN.  Secured with tamper resistant tape and put in clear plastic bag.  Prepared to walk over to employee pharmacy for disposal with patient and security.    Pill count  oxycodone - apap 5-325 mg 24/30.

## 2022-03-09 NOTE — Progress Notes (Signed)
PROVIDER NOTE: Information contained herein reflects review and annotations entered in association with encounter. Interpretation of such information and data should be left to medically-trained personnel. Information provided to patient can be located elsewhere in the medical record under "Patient Instructions". Document created using STT-dictation technology, any transcriptional errors that may result from process are unintentional.    Patient: Hayden Hall  Service Category: E/M  Provider: Gillis Santa, MD  DOB: [birth year]  DOS: 03/09/2022  Specialty: Interventional Pain Management  MRN: [id number]  Setting: Ambulatory outpatient  PCP: Hayden Burrow, MD  Type: Established Patient    Referring Provider: Theotis Hall*  Location: Office  Delivery: Face-to-face     HPI  Mr. Hayden Hall, a 66 y.o. 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 Mr. Hayden Hall primary complain today is neck pain, right occipital pain Last encounter: My last encounter with him was on 03/03/2022. Pertinent problems: Mr. Hayden Hall has Cervicalgia; DDD (degenerative disc disease), cervical; Cervico-occipital neuralgia; and Cervical facet joint syndrome on their pertinent problem list. Pain Assessment: Severity of   is reported as a  /10. Location:    / . Onset:  . Quality:  . Timing:  . Modifying factor(s):  Marland Kitchen Vitals:  vitals were not taken for this visit.   Reason for encounter:   Hayden Hall presents today for right cervical facet medial branch nerve block and right occipital nerve block.  I was able to review his urine toxicology screen this morning which was positive for cocaine and its metabolites.  This was very alarming as the patient was displaying aberrant behavior at the last visit.  I informed him that this is very concerning and that he should seek help.  I did offer him a referral to rehab but he declined.  He states that he utilized cocaine this  morning as well.  I told him that from a safety standpoint it is not safe for [place] to be doing injections with local anesthetics including lidocaine and ropivacaine especially when he has another local anesthetic such as cocaine in his system.  He was somewhat apologetic.  I informed him that medication management will no longer be an option here.  If he would like to continue with interventional pain management via cervical spinal injections, he will need to have 2 documented negative urine toxicology screens as well as a negative rapid screen on the day of each procedure.  Patient endorsed understanding.  Pharmacotherapy Assessment    Monitoring:  PMP: PDMP not reviewed this encounter.       Pharmacotherapy: No side-effects or adverse reactions reported. Compliance: Medication agreement violation - unsanctioned acquisition/use of additional opioid-containing medication Effectiveness: Clinically acceptable.  No notes on file  UDS:  Summary  Date Value Ref Range Status  03/03/2022 Note  Final    Comment:    ==================================================================== [employer] Select 13 (MW) ==================================================================== Test                             Result       Flag       Units  Drug Present not Declared for Prescription Verification   Cocaine                        137          UNEXPECTED ng/mg creat   Benzoylecgonine                >  7463        UNEXPECTED ng/mg creat    Source of cocaine is most commonly illicit, but cocaine is present    in some topical anesthetic solutions. Benzoylecgonine is an expected    metabolite of cocaine.  Drug Absent but Declared for Prescription Verification   Oxycodone                      Not Detected UNEXPECTED ng/mg creat ==================================================================== Test                      Result    Flag   Units      Ref Range   Creatinine              67                mg/dL      >=20 ==================================================================== Declared Medications:  The flagging and interpretation on this report are based on the  following declared medications.  Unexpected results may arise from  inaccuracies in the declared medications.   **Note: The testing scope of this panel includes these medications:   Oxycodone (Percocet)   **Note: The testing scope of this panel does not include the  following reported medications:   Acetaminophen (Percocet)  Bupropion (Wellbutrin)  Cyclobenzaprine (Flexeril)  Gabapentin (Neurontin)  Ibuprofen (Advil)  Levothyroxine (Synthroid) ==================================================================== For clinical consultation, please call [phone]. ====================================================================      ROS  Constitutional: Denies any fever or chills Gastrointestinal: No reported hemesis, hematochezia, vomiting, or acute GI distress Musculoskeletal:  Neck pain, occipital pain Neurological: No reported episodes of acute onset apraxia, aphasia, dysarthria, agnosia, amnesia, paralysis, loss of coordination, or loss of consciousness  Medication Review  buPROPion, cyclobenzaprine, gabapentin, ibuprofen, levothyroxine, and oxyCODONE-acetaminophen  History Review  Allergy: Mr. Hayden Hall is allergic to vesicare [solifenacin]. Drug: Mr. Hayden Hall  reports no history of drug use. Alcohol:  reports that he does not currently use alcohol. Tobacco:  reports that he has been smoking cigarettes and cigars. He has never used smokeless tobacco. Social: Mr. Hayden Hall  reports that he has been smoking cigarettes and cigars. He has never used smokeless tobacco. He reports that he does not currently use alcohol. He reports that he does not use drugs. Medical:  has a past medical history of Anxiety, Arthritis, Asthma, Bipolar disorder ([place]), BPH (benign prostatic hyperplasia), Bronchitis, Cataract,  Chronic constipation, Chronic pain, COPD (chronic obstructive pulmonary disease) ([place]), Depression, Dry eye, GERD (gastroesophageal reflux disease), Headache, Hepatitis C, Herpes, History of pancreatitis, History of seizure disorder, Hypothyroidism, Memory impairment, Myoclonic jerking, Neck injury, Nocturia, Plantar fasciitis, Pneumonia, Rotator cuff tear, Schizophrenia ([place]), SOB (shortness of breath), Trigger finger, Urinary frequency, and Wears glasses. Surgical: Mr. Burkett  has a past surgical history that includes Trigger finger release; Rotator cuff repair (Left); Rotator cuff repair (Right); Colonoscopy (2011); Shoulder arthroscopy (Left, 07/12/2018); Colonoscopy with propofol (N/A, 05/21/2020); and Esophagogastroduodenoscopy (egd) with propofol (N/A, 05/21/2020). Family: family history includes Aneurysm in his father; Bladder Cancer in his mother; Breast cancer in his mother; Cancer in his sister, sister, and sister; Heart Problems in his father and mother; Heart disease in an other family member; Kidney disease in his cousin and mother; Migraines in his mother, sister, and sister; Prostate cancer in his father; Stroke in his father and paternal grandfather.  Laboratory Chemistry Profile   Renal Lab Results  Component Value Date   BUN 18 08/13/2021   CREATININE 0.86 08/13/2021  GFRAA >60 07/12/2018   GFRNONAA >60 08/13/2021    Hepatic Lab Results  Component Value Date   AST 18 04/27/2018   ALT 13 04/27/2018   ALBUMIN 4.8 04/27/2018   ALKPHOS 46 04/27/2018   LIPASE 107 (H) 05/17/2017    Electrolytes Lab Results  Component Value Date   NA 136 08/13/2021   K 3.9 08/13/2021   CL 107 08/13/2021   CALCIUM 9.4 08/13/2021    Bone No results found for: VD25OH, VD125OH2TOT, YP9509TO6, ZT2458KD9, 25OHVITD1, 25OHVITD2, 25OHVITD3, TESTOFREE, TESTOSTERONE  Inflammation (CRP: Acute Phase) (ESR: Chronic Phase) No results found for: CRP, ESRSEDRATE, LATICACIDVEN       Note: Above Lab  results reviewed.   Physical Exam  General appearance: Well nourished, well developed, and well hydrated. In no apparent acute distress Mental status: Alert, oriented x 3 (person, place, & time)       Respiratory: No evidence of acute respiratory distress Eyes: PERLA Vitals: There were no vitals taken for this visit. BMI: Estimated body mass index is 21.29 kg/m as calculated from the following:   Height as of 03/03/22: 6' (1.829 m).   Weight as of 03/03/22: 157 lb (71.2 kg). Ideal: Ideal body weight: 77.6 kg (171 lb 1.2 oz)    Assessment   Diagnosis Status  1. Cervical facet joint syndrome   2. Cervical spondylosis   3. Cervico-occipital neuralgia   4. Chronic pain syndrome    Controlled Controlled Controlled   Updated Problems: No problems updated.  Plan of Care   Discussed inappropriate urine toxicology screen with patient.  No longer a candidate for medication management.  We will need to documented appropriate, negative urine toxicology screens as well as day of procedure screen if he would like to continue with any interventional therapy.  He did bring his medications in today which we will discard with the patient today at the pharmacy.  I did offer the patient referral to rehab but he declined.  He did consent to safety.   Follow-up plan:   No follow-ups on file.     Consider pulsed radiofrequency ablation of the right suprascapular nerve versus Sprint peripheral nerve stimulation.    Recent Visits Date Type Provider Dept  03/03/22 Office Visit Hayden Santa, MD [facility]  02/07/22 Procedure visit Hayden Santa, MD [facility]  01/31/22 Office Visit Hayden Santa, MD [facility]  01/12/22 Procedure visit Hayden Santa, MD [facility]  01/05/22 Office Visit Hayden Santa, MD [facility]  Showing recent visits within past 90 days and meeting all other requirements Today's Visits Date Type Provider Dept  03/09/22  Appointment Hayden Santa, MD [facility]  Showing today's visits and meeting all other requirements Future Appointments No visits were found meeting these conditions. Showing future appointments within next 90 days and meeting all other requirements  I discussed the assessment and treatment plan with the patient. The patient was provided an opportunity to ask questions and all were answered. The patient agreed with the plan and demonstrated an understanding of the instructions.  Patient advised to call back or seek an in-person evaluation if the symptoms or condition worsens.  Duration of encounter: 15 minutes.  Note by: Hayden Santa, MD Date: 03/09/2022; Time: 11:32 AM

## 2022-03-09 NOTE — Progress Notes (Signed)
Nursing Pain Medication Assessment:  Safety precautions to be maintained throughout the outpatient stay will include: orient to surroundings, keep bed in low position, maintain call bell within reach at all times, provide assistance with transfer out of bed and ambulation.  Medication Inspection Compliance: Pill count conducted under aseptic conditions, in front of the patient. Neither the pills nor the bottle was removed from the patient's sight at any time. Once count was completed pills were immediately returned to the patient in their original bottle.  Medication: Oxycodone/APAP Pill/Patch Count:  24 of 30 pills remain Pill/Patch Appearance: Markings consistent with prescribed medication Bottle Appearance: Standard pharmacy container. Clearly labeled. Filled Date: 06 / 01 / 2023 Last Medication intake:  Today

## 2022-07-23 ENCOUNTER — Emergency Department
Admission: EM | Admit: 2022-07-23 | Discharge: 2022-07-23 | Disposition: A | Discharge status: Home / Self Care | Payer: Medicare Other | Attending: Emergency Medicine | Admitting: Emergency Medicine

## 2022-07-23 ENCOUNTER — Emergency Department: Payer: Medicare Other

## 2022-07-23 ENCOUNTER — Encounter: Payer: Self-pay | Admitting: Emergency Medicine

## 2022-07-23 DIAGNOSIS — M436 Torticollis: Secondary | ICD-10-CM | POA: Insufficient documentation

## 2022-07-23 DIAGNOSIS — M542 Cervicalgia: Secondary | ICD-10-CM | POA: Diagnosis present

## 2022-07-23 MED ORDER — TIZANIDINE HCL 2 MG PO TABS
4.0000 mg | ORAL_TABLET | Freq: Once | ORAL | Status: AC
  Administered 2022-07-23: 4 mg via ORAL
  Filled 2022-07-23: qty 2

## 2022-07-23 MED ORDER — TIZANIDINE HCL 4 MG PO TABS: 4.0000 mg | ORAL_TABLET | Freq: Three times a day (TID) | ORAL | 0 refills | Status: DC | PRN

## 2022-07-23 MED ORDER — MELOXICAM 15 MG PO TABS: 15.0000 mg | ORAL_TABLET | Freq: Every day | ORAL | 2 refills | Status: AC

## 2022-07-23 MED ORDER — MELOXICAM 7.5 MG PO TABS
15.0000 mg | ORAL_TABLET | ORAL | Status: AC
  Administered 2022-07-23: 15 mg via ORAL
  Filled 2022-07-23: qty 2

## 2022-07-23 NOTE — ED Notes (Signed)
Pod D pyxis does not have zanaflex. Pharmacy will send.

## 2022-07-23 NOTE — Discharge Instructions (Signed)
Please follow up with primary care if not improving over the next few days.  Return to the ER for symptoms that change, worsen, or for new concerns if unable to schedule an appointment.

## 2022-07-23 NOTE — ED Triage Notes (Signed)
Pt presents via POV with complaints of neck pain - Hx of cervical facet joint syndrome. Pt wearing a soft C-Collar. Pt also endorses left sided shoulder pain for the last 3 days. No meds taken PTA.

## 2022-07-23 NOTE — ED Notes (Signed)
Pt wants rx for flexiril. Provider informed and will order.

## 2022-07-23 NOTE — ED Provider Notes (Signed)
[facility] Provider Note    Event Date/Time   First MD Initiated Contact with Patient 07/23/22 [phone]     (approximate)   History   Neck Pain and Shoulder Pain   HPI  Hayden Hall is a 66 y.o. male presents to the emergency department for treatment and evaluation of acute on chronic neck pain.  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  No specific injury.     Physical Exam   Triage Vital Signs: ED Triage Vitals  Enc Vitals Group     BP 07/23/22 0250 (!) 144/90     Pulse Rate 07/23/22 0250 89     Resp 07/23/22 0250 18     Temp 07/23/22 0250 98.4 F (36.9 C)     Temp Source 07/23/22 0250 Oral     SpO2 07/23/22 0250 98 %     Weight 07/23/22 0247 158 lb 11.7 oz (72 kg)     Height 07/23/22 0247 6' (1.829 m)     Head Circumference --      Peak Flow --      Pain Score 07/23/22 0251 6     Pain Loc --      Pain Edu? --      Excl. in [place]? --     Most recent vital signs: Vitals:   07/23/22 0250  BP: (!) 144/90  Pulse: 89  Resp: 18  Temp: 98.4 F (36.9 C)  SpO2: 98%     General: Awake, no distress.  CV:  Good peripheral perfusion.  Resp:  Normal effort.  Abd:  No distention.  Other:  No focal tenderness over the bones of the cervical spine. General tenderness over the lateral left neck into the superior shoulder.   ED Results / Procedures / Treatments   Labs (all labs ordered are listed, but only abnormal results are displayed) Labs Reviewed - No data to display   EKG  Not indicated.   RADIOLOGY    PROCEDURES:  Critical Care performed: No  Procedures   MEDICATIONS ORDERED IN ED: Medications  tiZANidine (ZANAFLEX) tablet 4 mg (4 mg Oral Given 07/23/22 0805)  meloxicam (MOBIC) tablet 15 mg (15 mg Oral Given 07/23/22 0759)     IMPRESSION / MDM / ASSESSMENT AND PLAN / ED COURSE  I reviewed the triage vital signs and the nursing notes.                               Differential diagnosis includes, but is not limited to, acute on chronic neck pain, torticollis.   Patient's presentation is most consistent with acute complicated illness / injury requiring diagnostic workup.  66 year old male presenting to the emergency department for treatment and evaluation of left-sided neck pain without known trauma.  See HPI for further details.  While awaiting ER room assignment, imaging of the left shoulder was performed and is negative for acute concerns.  Symptoms and exam are more consistent with torticollis.  He will be treated with meloxicam and Zanaflex.  He was encouraged to follow-up with his primary care provider for symptoms that are not improving over the next few days.  If symptoms change or worsen and he is unable to schedule appointment, he is to return to the emergency department.     FINAL CLINICAL IMPRESSION(S) / ED DIAGNOSES   Final diagnoses:  Acute torticollis  Rx / DC Orders   ED Discharge Orders          Ordered    meloxicam (MOBIC) 15 MG tablet  Daily        07/23/22 0740    tiZANidine (ZANAFLEX) 4 MG tablet  Every 8 hours PRN        07/23/22 4010             Note:  This document was prepared using Dragon voice recognition software and may include unintentional dictation errors.   Victorino Dike, FNP 07/23/22 1343    Duffy Bruce, MD 07/24/22 2008

## 2022-09-06 ENCOUNTER — Emergency Department
Admission: EM | Admit: 2022-09-06 | Discharge: 2022-09-06 | Disposition: A | Discharge status: Home / Self Care | Payer: Medicare Other | Attending: Emergency Medicine | Admitting: Emergency Medicine

## 2022-09-06 ENCOUNTER — Other Ambulatory Visit: Payer: Self-pay

## 2022-09-06 DIAGNOSIS — H6690 Otitis media, unspecified, unspecified ear: Secondary | ICD-10-CM

## 2022-09-06 DIAGNOSIS — H9201 Otalgia, right ear: Secondary | ICD-10-CM | POA: Diagnosis present

## 2022-09-06 DIAGNOSIS — J449 Chronic obstructive pulmonary disease, unspecified: Secondary | ICD-10-CM | POA: Insufficient documentation

## 2022-09-06 DIAGNOSIS — H6691 Otitis media, unspecified, right ear: Secondary | ICD-10-CM | POA: Insufficient documentation

## 2022-09-06 MED ORDER — ACETAMINOPHEN 325 MG PO TABS
650.0000 mg | ORAL_TABLET | Freq: Once | ORAL | Status: AC
  Administered 2022-09-06: 650 mg via ORAL
  Filled 2022-09-06: qty 2

## 2022-09-06 MED ORDER — NAPROXEN 500 MG PO TABS: 500.0000 mg | ORAL_TABLET | Freq: Two times a day (BID) | ORAL | 0 refills | Status: DC

## 2022-09-06 MED ORDER — AMOXICILLIN-POT CLAVULANATE 875-125 MG PO TABS: 1.0000 | ORAL_TABLET | Freq: Two times a day (BID) | ORAL | 0 refills | Status: AC

## 2022-09-06 NOTE — ED Provider Notes (Signed)
[facility] Provider Note    Event Date/Time   First MD Initiated Contact with Patient 09/06/22 1432     (approximate)   History   Ear Pain   HPI  Hayden Hall is a 66 y.o. male who presents today for evaluation of right-sided ear pain.  Patient reports that this has been ongoing for the past 3 to 4 days.  He feels pain to the front of his ear when he pushes on his tragus.  He has not noticed any drainage.  No headaches.  No dizziness.  He has not had any fevers or chills.  Has not noticed any drainage from his ear.  No ringing in his ears.  Patient Active Problem List   Diagnosis Date Noted   Lumbar radiculopathy 01/31/2022   Cervical facet joint syndrome 11/18/2021   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   Generalized abdominal pain    Acute esophagitis    Screen for colon cancer    Polyp of colon    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   Cervical spondylosis 02/13/2018   Cervicalgia 12/07/2017   History of rotator cuff surgery 12/07/2017   Cervical radiculopathy 12/07/2017   DDD (degenerative disc disease), cervical 12/07/2017   Chronic pain syndrome 12/07/2017   BPH with obstruction/lower urinary tract symptoms 10/21/2015   COPD, mild ([place]) 04/07/2014   Low back pain 01/09/2013          Physical Exam   Triage Vital Signs: ED Triage Vitals  Enc Vitals Group     BP 09/06/22 1432 (!) 126/92     Pulse Rate 09/06/22 1432 67     Resp 09/06/22 1432 18     Temp 09/06/22 1432 98.3 F (36.8 C)     Temp Source 09/06/22 1432 Oral     SpO2 09/06/22 1432 96 %     Weight 09/06/22 1433 157 lb (71.2 kg)     Height 09/06/22 1433 6' (1.829 m)     Head Circumference --      Peak Flow --      Pain Score 09/06/22 1433 10     Pain Loc --      Pain Edu? --      Excl. in [place]? --     Most recent vital signs: Vitals:   09/06/22 1432  BP: (!) 126/92  Pulse: 67  Resp: 18  Temp: 98.3 F (36.8 C)  SpO2: 96%    Physical  Exam Vitals and nursing note reviewed.  Constitutional:      General: Awake and alert. No acute distress.    Appearance: Normal appearance. The patient is normal weight.  HENT:     Head: Normocephalic and atraumatic.     Mouth: Mucous membranes are moist.  Left TM: Normal, normal canal, no mastoid tenderness or erythema Right TM: Erythematous and mildly bulging TM, normal canal.  No mastoid tenderness erythema Eyes:     General: PERRL. Normal EOMs        Right eye: No discharge.        Left eye: No discharge.     Conjunctiva/sclera: Conjunctivae normal.  Cardiovascular:     Rate and Rhythm: Normal rate and regular rhythm.     Pulses: Normal pulses.  Pulmonary:     Effort: Pulmonary effort is normal. No respiratory distress.     Breath sounds: Normal breath sounds.  Abdominal:     Abdomen is soft. There is no abdominal tenderness. No rebound or guarding. No  distention. Musculoskeletal:        General: No swelling. Normal range of motion.     Cervical back: Normal range of motion and neck supple.  Skin:    General: Skin is warm and dry.     Capillary Refill: Capillary refill takes less than 2 seconds.     Findings: No rash.  Neurological:     Mental Status: The patient is awake and alert.      ED Results / Procedures / Treatments   Labs (all labs ordered are listed, but only abnormal results are displayed) Labs Reviewed - No data to display   EKG     RADIOLOGY     PROCEDURES:  Critical Care performed:   Procedures   MEDICATIONS ORDERED IN ED: Medications  acetaminophen (TYLENOL) tablet 650 mg (650 mg Oral Given 09/06/22 1507)     IMPRESSION / MDM / [place] / ED COURSE  I reviewed the triage vital signs and the nursing notes.   Differential diagnosis includes, but is not limited to, otitis media, otitis externa, foreign body, cerumen impaction.  No headache or dizziness or tenderness to suggest vascular etiology.  Patient does have a  bulging and erythematous tympanic membrane consistent with otitis media.  Canals clear, no otorrhea, not consistent with otitis externa or malignant otitis externa.  No mastoid tenderness or erythema to suggest mastoiditis.  He was started on antibiotics.  We discussed return precautions and outpatient follow-up.  Patient presented agrees with plan.  He was discharged in stable condition.   Patient's presentation is most consistent with acute illness / injury with system symptoms.     FINAL CLINICAL IMPRESSION(S) / ED DIAGNOSES   Final diagnoses:  Acute otitis media, unspecified otitis media type     Rx / DC Orders   ED Discharge Orders          Ordered    amoxicillin-clavulanate (AUGMENTIN) 875-125 MG tablet  2 times daily        09/06/22 1500    naproxen (NAPROSYN) 500 MG tablet  2 times daily with meals        09/06/22 1501             Note:  This document was prepared using Dragon voice recognition software and may include unintentional dictation errors.   Emeline Gins 09/06/22 1544    Lavonia Drafts, MD 09/06/22 1549

## 2022-09-06 NOTE — ED Triage Notes (Signed)
Pt to er, pt c/o R ear pain, pt states that his ear has been hurting for the past month, states that it got worse over the past 4-5 days

## 2022-09-06 NOTE — Discharge Instructions (Addendum)
Take antibiotics as prescribed.  Please return for any new, worsening, or change in symptoms or other concerns.

## 2022-09-06 NOTE — ED Notes (Signed)
Patient Alert and oriented to baseline. Stable and ambulatory to baseline. Patient verbalized understanding of the discharge instructions.  Patient belongings were taken by the patient.   

## 2022-10-24 ENCOUNTER — Telehealth: Payer: Self-pay

## 2022-10-24 NOTE — Telephone Encounter (Signed)
The patient walked in asking to be reinstated as a patient with Dr. Holley Raring. I looked at his last note and there seems to be an issue with his uds. Please advise on how you want me to proceed with.

## 2023-08-21 ENCOUNTER — Other Ambulatory Visit: Payer: Self-pay

## 2023-08-21 DIAGNOSIS — Z122 Encounter for screening for malignant neoplasm of respiratory organs: Secondary | ICD-10-CM

## 2023-08-21 DIAGNOSIS — Z87891 Personal history of nicotine dependence: Secondary | ICD-10-CM

## 2023-08-21 DIAGNOSIS — F17210 Nicotine dependence, cigarettes, uncomplicated: Secondary | ICD-10-CM

## 2023-08-23 ENCOUNTER — Ambulatory Visit
Admission: RE | Admit: 2023-08-23 | Discharge: 2023-08-23 | Disposition: A | Discharge status: Home / Self Care | Payer: Medicare Other | Source: Ambulatory Visit | Attending: Acute Care | Admitting: Acute Care

## 2023-08-23 ENCOUNTER — Ambulatory Visit: Payer: Medicare Other | Admitting: Acute Care

## 2023-08-23 DIAGNOSIS — F17210 Nicotine dependence, cigarettes, uncomplicated: Secondary | ICD-10-CM | POA: Diagnosis not present

## 2023-08-23 DIAGNOSIS — Z122 Encounter for screening for malignant neoplasm of respiratory organs: Secondary | ICD-10-CM

## 2023-08-23 DIAGNOSIS — Z87891 Personal history of nicotine dependence: Secondary | ICD-10-CM | POA: Diagnosis present

## 2023-08-23 NOTE — Patient Instructions (Signed)

## 2023-08-23 NOTE — Progress Notes (Signed)
 Virtual Visit via Telephone Note  I connected with Hayden Hall on 08/23/23 at  3:00 PM EST by telephone and verified that I am speaking with the correct person using two identifiers.  Location: Patient: in home Provider: [address] [place], [place], [address]   Shared Decision Making Visit Lung Cancer Screening Program [phone])   Eligibility: Age 67 y.o. Pack Years Smoking History Calculation 82 (# packs/per year x # years smoked) Recent History of coughing up blood  no Unexplained weight loss? no ( >Than 15 pounds within the last 6 months ) Prior History Lung / other cancer no (Diagnosis within the last 5 years already requiring surveillance chest CT Scans). Smoking Status Current Smoker Former Smokers: Years since quit:  NA  Quit Date: NA  Visit Components: Discussion included one or more decision making aids. yes Discussion included risk/benefits of screening. yes Discussion included potential follow up diagnostic testing for abnormal scans. yes Discussion included meaning and risk of over diagnosis. yes Discussion included meaning and risk of False Positives. yes Discussion included meaning of total radiation exposure. yes  Counseling Included: Importance of adherence to annual lung cancer LDCT screening. yes Impact of comorbidities on ability to participate in the program. yes Ability and willingness to under diagnostic treatment. yes  Smoking Cessation Counseling: Current Smokers:  Discussed importance of smoking cessation. yes Information about tobacco cessation classes and interventions provided to patient. yes Patient provided with "ticket" for LDCT Scan. yes Symptomatic Patient. no  Counseling NA Diagnosis Code: Tobacco Use Z72.0 Asymptomatic Patient yes  Counseling (Intermediate counseling: > three minutes counseling) U0454 Former Smokers:  Discussed the importance of maintaining cigarette abstinence. yes Diagnosis Code: Personal History  of Nicotine Dependence. U98.119 Information about tobacco cessation classes and interventions provided to patient. Yes Patient provided with "ticket" for LDCT Scan. yes Written Order for Lung Cancer Screening with LDCT placed in Epic. Yes (CT Chest Lung Cancer Screening Low Dose W/O CM) [id number] Z12.2-Screening of respiratory organs Z87.891-Personal history of nicotine dependence   Karl Bales, RN 08/23/23

## 2023-08-24 ENCOUNTER — Ambulatory Visit: Admission: RE | Admit: 2023-08-24 | Payer: Medicare Other | Source: Ambulatory Visit

## 2023-09-15 ENCOUNTER — Other Ambulatory Visit: Payer: Self-pay | Admitting: Acute Care

## 2023-09-15 DIAGNOSIS — Z87891 Personal history of nicotine dependence: Secondary | ICD-10-CM

## 2023-09-15 DIAGNOSIS — Z122 Encounter for screening for malignant neoplasm of respiratory organs: Secondary | ICD-10-CM

## 2023-09-15 DIAGNOSIS — F17210 Nicotine dependence, cigarettes, uncomplicated: Secondary | ICD-10-CM

## 2024-08-19 DIAGNOSIS — Z13820 Encounter for screening for osteoporosis: Secondary | ICD-10-CM

## 2024-08-20 DIAGNOSIS — Z13820 Encounter for screening for osteoporosis: Secondary | ICD-10-CM

## 2024-08-23 ENCOUNTER — Ambulatory Visit

## 2024-08-23 ENCOUNTER — Ambulatory Visit
Admission: RE | Admit: 2024-08-23 | Discharge: 2024-08-23 | Disposition: A | Discharge status: Home / Self Care | Source: Ambulatory Visit | Attending: Acute Care | Admitting: Acute Care

## 2024-08-23 DIAGNOSIS — F17210 Nicotine dependence, cigarettes, uncomplicated: Secondary | ICD-10-CM | POA: Insufficient documentation

## 2024-08-23 DIAGNOSIS — Z122 Encounter for screening for malignant neoplasm of respiratory organs: Secondary | ICD-10-CM | POA: Diagnosis present

## 2024-08-23 DIAGNOSIS — Z87891 Personal history of nicotine dependence: Secondary | ICD-10-CM | POA: Diagnosis present

## 2024-08-26 ENCOUNTER — Other Ambulatory Visit: Payer: Self-pay | Admitting: Acute Care

## 2024-08-26 DIAGNOSIS — Z87891 Personal history of nicotine dependence: Secondary | ICD-10-CM

## 2024-08-26 DIAGNOSIS — F17210 Nicotine dependence, cigarettes, uncomplicated: Secondary | ICD-10-CM

## 2024-08-26 DIAGNOSIS — Z122 Encounter for screening for malignant neoplasm of respiratory organs: Secondary | ICD-10-CM

## 2024-08-27 ENCOUNTER — Ambulatory Visit: Admitting: Podiatry

## 2024-09-12 ENCOUNTER — Ambulatory Visit

## 2024-09-23 ENCOUNTER — Ambulatory Visit

## 2024-09-23 NOTE — Progress Notes (Deleted)
" °  Cardiology Office Note   Date:  09/23/2024  ID:  Cruzito Lynn Dogan, DOB 11/05/1955, MRN [id number] PCP: Ernie Yancy Roof, MD  [facility] Providers Cardiologist:  None { Click to update primary MD,subspecialty MD or APP then REFRESH:1}    History of Present Illness Hayden Hall is a 68 y.o. male PMH tobacco use, COPD who presents for CAC found on recent CT scan.  ***.  No recent LDL on file.  Relevant CVD History -CT scan without contrast 08/2024 aortic atherosclerosis and mild CAC LAD -Exercise stress SPECT 10/2017 small, fixed apical defect consistent with infarction.  LVEF 30 to 44% on that study.   ROS: Pt denies any chest discomfort, jaw pain, arm pain, palpitations, syncope, presyncope, orthopnea, PND, or LE edema.  Studies Reviewed I have independently reviewed the patient's ECG, recent CT scan, previous medical records, previous cardiac testing, previous blood work.  Physical Exam VS:  There were no vitals taken for this visit.       Wt Readings from Last 3 Encounters:  08/23/24 145 lb (65.8 kg)  09/06/22 157 lb (71.2 kg)  07/23/22 158 lb 11.7 oz (72 kg)    GEN: No acute distress. NECK: No JVD; No carotid bruits. CARDIAC: ***RRR, no murmurs, rubs, gallops. RESPIRATORY:  Clear to auscultation. EXTREMITIES:  Warm and well-perfused. No edema.  ASSESSMENT AND PLAN CAC Aortic atherosclerosis HLD Tobacco use        {Are you ordering a CV Procedure (e.g. stress test, cath, DCCV, TEE, etc)?   Press F2        :[id number]}  Dispo: ***  Signed, Caron Poser, MD  "

## 2024-10-07 ENCOUNTER — Ambulatory Visit

## 2024-10-07 VITALS — BP 110/70 | HR 45 | Ht 72.0 in | Wt 153.0 lb

## 2024-10-07 DIAGNOSIS — R001 Bradycardia, unspecified: Secondary | ICD-10-CM | POA: Diagnosis not present

## 2024-10-07 DIAGNOSIS — R5383 Other fatigue: Secondary | ICD-10-CM | POA: Diagnosis not present

## 2024-10-07 DIAGNOSIS — I2510 Atherosclerotic heart disease of native coronary artery without angina pectoris: Secondary | ICD-10-CM | POA: Diagnosis not present

## 2024-10-07 DIAGNOSIS — E782 Mixed hyperlipidemia: Secondary | ICD-10-CM

## 2024-10-07 DIAGNOSIS — I700 Atherosclerosis of aorta: Secondary | ICD-10-CM | POA: Diagnosis not present

## 2024-10-07 DIAGNOSIS — Z720 Tobacco use: Secondary | ICD-10-CM

## 2024-10-07 DIAGNOSIS — Z79899 Other long term (current) drug therapy: Secondary | ICD-10-CM | POA: Diagnosis not present

## 2024-10-07 MED ORDER — ASPIRIN 81 MG PO TBEC: 81.0000 mg | DELAYED_RELEASE_TABLET | Freq: Every day | ORAL | Status: AC

## 2024-10-07 NOTE — Patient Instructions (Signed)
 Medication Instructions:  Your physician recommends the following medication changes.  START TAKING: Aspirin  81 mg daily  *If you need a refill on your cardiac medications before your next appointment, please call your pharmacy*  Lab Work: Your provider would like for you to have following labs drawn today Lipid panel.   If you have labs (blood work) drawn today and your tests are completely normal, you will receive your results only by: MyChart Message (if you have MyChart) OR A paper copy in the mail If you have any lab test that is abnormal or we need to change your treatment, we will call you to review the results.  Testing/Procedures: Your physician has requested that you have an echocardiogram. Echocardiography is a painless test that uses sound waves to create images of your heart. It provides your doctor with information about the size and shape of your heart and how well your hearts chambers and valves are working.   You may receive an ultrasound enhancing agent through an IV if needed to better visualize your heart during the echo. This procedure takes approximately one hour.  There are no restrictions for this procedure.  This will take place at [address] [facility] [address], [place] [address]  Please note: We ask at that you not bring children with you during ultrasound (echo/ vascular) testing. Due to room size and safety concerns, children are not allowed in the ultrasound rooms during exams. Our front office staff cannot provide observation of children in our lobby area while testing is being conducted. An adult accompanying a patient to their appointment will only be allowed in the ultrasound room at the discretion of the ultrasound technician under special circumstances. We apologize for any inconvenience.  Your provider has ordered a Exercise Myoview  Stress test. This will take place at [facility]. Please report to the [facility] medical mall entrance. The  volunteers at the first desk will direct you where to go.  [facility] MYOVIEW   Your provider has ordered a Stress Test with nuclear imaging. The purpose of this test is to evaluate the blood supply to your heart muscle. This procedure is referred to as a Non-Invasive Stress Test. This is because other than having an IV started in your vein, nothing is inserted or invades your body. Cardiac stress tests are done to find areas of poor blood flow to the heart by determining the extent of coronary artery disease (CAD). Some patients exercise on a treadmill, which naturally increases the blood flow to your heart, while others who are unable to walk on a treadmill due to physical limitations will have a pharmacologic/chemical stress agent called Lexiscan  . This medicine will mimic walking on a treadmill by temporarily increasing your coronary blood flow.   Please note: these test may take anywhere between 2-4 hours to complete  How to prepare for your Myoview  test:  Nothing to eat for 6 hours prior to the test No caffeine for 24 hours prior to test No smoking 24 hours prior to test. Your medication may be taken with water. Hold BETA BLOCKER 24 hours prior to test Ladies, please do not wear dresses.  Skirts or pants are appropriate. Please wear a short sleeve shirt. No perfume, cologne or lotion. Wear comfortable walking shoes. No heels!   PLEASE NOTIFY THE OFFICE AT LEAST 24 HOURS IN ADVANCE IF YOU ARE UNABLE TO KEEP YOUR APPOINTMENT.  [phone] AND  PLEASE NOTIFY NUCLEAR MEDICINE AT [facility] AT LEAST 24 HOURS IN ADVANCE IF YOU  ARE UNABLE TO KEEP YOUR APPOINTMENT. [phone]   Follow-Up: At [facility], you and your health needs are our priority.  As part of our continuing mission to provide you with exceptional heart care, our providers are all part of one team.  This team includes your primary Cardiologist (physician) and Advanced Practice Providers or APPs (Physician Assistants and  Nurse Practitioners) who all work together to provide you with the care you need, when you need it.  Your next appointment:   1 year(s)  Provider:   You may see Caron Poser, MD   We recommend signing up for the patient portal called MyChart.  Sign up information is provided on this After Visit Summary.  MyChart is used to connect with patients for Virtual Visits (Telemedicine).  Patients are able to view lab/test results, encounter notes, upcoming appointments, etc.  Non-urgent messages can be sent to your provider as well.   To learn more about what you can do with MyChart, go to [email or web address].

## 2024-10-07 NOTE — Progress Notes (Signed)
" °  Cardiology Office Note   Date:  10/07/2024  ID:  Samik, Balkcom 06-02-1956, MRN [id number] PCP: Ernie Yancy Roof, MD  [facility] Providers Cardiologist:  Caron Poser, MD     History of Present Illness Hayden Hall is a 69 y.o. male PMH tobacco use, COPD who presents for CAC found on recent CT scan.  Patient presents for further evaluation of coronary artery calcifications and aortic atherosclerosis seen on recent CT scan.  He denies any chest discomfort but does report fatigue that seems to be persistent for the last few months.  He had a SPECT done in 2019 which showed a small fixed apical defect and a reduced LVEF, though this was never confirmed with an echocardiogram.  No recent LDL on file.  Relevant CVD History -CT scan without contrast 08/2024 aortic atherosclerosis and mild CAC LAD -Exercise stress SPECT 10/2017 small, fixed apical defect consistent with infarction.  LVEF 30 to 44% on that study.   ROS: Pt denies any chest discomfort, jaw pain, arm pain, palpitations, syncope, presyncope, orthopnea, PND, or LE edema.  Studies Reviewed I have independently reviewed the patient's ECG, recent CT scan, previous medical records, previous cardiac testing, previous blood work.  Physical Exam VS:  BP 110/70 (BP Location: Left Arm, Patient Position: Sitting, Cuff Size: Normal)   Pulse (!) 45   Ht 6' (1.829 m)   Wt 153 lb (69.4 kg)   SpO2 99%   BMI 20.75 kg/m        Wt Readings from Last 3 Encounters:  10/07/24 153 lb (69.4 kg)  08/23/24 145 lb (65.8 kg)  09/06/22 157 lb (71.2 kg)    GEN: No acute distress. NECK: No JVD; No carotid bruits. CARDIAC: RRR, no murmurs, rubs, gallops. RESPIRATORY:  Clear to auscultation. EXTREMITIES:  Warm and well-perfused. No edema.  ASSESSMENT AND PLAN CAC Aortic atherosclerosis HLD Patient presents with incidental finding of CAC and aortic atherosclerosis.  No recent LDL on file.  He denies any chest  discomfort, but does report nonspecific fatigue which has been going on for a while.  His ECG does show sinus bradycardia.  Plan: - Start ASA 81 mg daily - Continue Lipitor 20 mg daily - Recheck lipid profile; goal LDL less than 70 - Given fatigue and sinus bradycardia, we will plan for exercise stress SPECT to assess for chronotropic incompetence as well as ischemia/infarction  Tobacco use Precontemplative.  Continue to assess and encourage abstinence/cessation at subsequent visits.  Fatigue Sinus bradycardia As above, several months of nonspecific fatigue.  ECG does show sinus bradycardia.  As above, we will obtain an exercise stress SPECT to assess for chronotropic incompetence as well as ischemia/infarction.  We will also obtain an echocardiogram to see if there is any structural contribution to symptoms, especially since a prior SPECT exam reported a reduced ejection fraction.     Informed Consent   The risks [chest pain, shortness of breath, cardiac arrhythmias, dizziness, blood pressure fluctuations, myocardial infarction, stroke/transient ischemic attack, nausea, vomiting, allergic reaction, radiation exposure, metallic taste sensation and life-threatening complications (estimated to be 1 in 10,000)], benefits (risk stratification, diagnosing coronary artery disease, treatment guidance) and alternatives of a nuclear stress test were discussed in detail with Mr. Sivley and he agrees to proceed.     Dispo: RTC 1 year or sooner as needed  Signed, Caron Poser, MD  "

## 2024-10-08 ENCOUNTER — Ambulatory Visit: Payer: Self-pay

## 2024-10-08 LAB — LIPID PANEL
Chol/HDL Ratio: 2.7 ratio (ref 0.0–5.0)
Cholesterol, Total: 167 mg/dL (ref 100–199)
HDL: 61 mg/dL
LDL Chol Calc (NIH): 96 mg/dL (ref 0–99)
Triglycerides: 48 mg/dL (ref 0–149)
VLDL Cholesterol Cal: 10 mg/dL (ref 5–40)

## 2024-10-14 MED ORDER — ATORVASTATIN CALCIUM 80 MG PO TABS: 80.0000 mg | ORAL_TABLET | Freq: Every day | ORAL | 3 refills | Status: AC

## 2024-10-25 ENCOUNTER — Ambulatory Visit

## 2024-10-25 DIAGNOSIS — I700 Atherosclerosis of aorta: Secondary | ICD-10-CM

## 2024-10-25 DIAGNOSIS — R5383 Other fatigue: Secondary | ICD-10-CM

## 2024-10-25 DIAGNOSIS — I2510 Atherosclerotic heart disease of native coronary artery without angina pectoris: Secondary | ICD-10-CM | POA: Diagnosis not present

## 2024-10-25 LAB — ECHOCARDIOGRAM COMPLETE
AR max vel: 2.91 cm2
AV Area VTI: 2.92 cm2
AV Area mean vel: 2.7 cm2
AV Mean grad: 2 mmHg
AV Peak grad: 3.2 mmHg
Ao pk vel: 0.9 m/s
Area-P 1/2: 2.83 cm2
Calc EF: 50.4 %
S' Lateral: 3.7 cm
Single Plane A2C EF: 43.2 %
Single Plane A4C EF: 48.9 %

## 2024-10-29 NOTE — Telephone Encounter (Signed)
 I left a voicemail for Hayden Hall stating that as per Dr. Argentina, Echocardiogram is normal which is excellent. Will continue current plans and await results of stress test. Our office will be in touch with any further updates. Patient asked to call or My Chart with any additional questions.

## 2024-10-29 NOTE — Telephone Encounter (Signed)
 Patient is returning call

## 2024-11-05 ENCOUNTER — Ambulatory Visit

## 2024-11-08 ENCOUNTER — Ambulatory Visit: Admission: RE | Admit: 2024-11-08

## 2024-11-25 ENCOUNTER — Other Ambulatory Visit
